# Patient Record
Sex: Female | Born: 1940
Health system: Southern US, Community
[De-identification: ages and names within clinical notes are randomized; demographics above are authoritative.]

## PROBLEM LIST (undated history)

## (undated) DIAGNOSIS — I272 Pulmonary hypertension, unspecified: Secondary | ICD-10-CM

## (undated) DIAGNOSIS — I700 Atherosclerosis of aorta: Secondary | ICD-10-CM

## (undated) DIAGNOSIS — R809 Proteinuria, unspecified: Secondary | ICD-10-CM

## (undated) DIAGNOSIS — Z9221 Personal history of antineoplastic chemotherapy: Secondary | ICD-10-CM

## (undated) DIAGNOSIS — Z9889 Other specified postprocedural states: Secondary | ICD-10-CM

## (undated) DIAGNOSIS — I10 Essential (primary) hypertension: Secondary | ICD-10-CM

## (undated) DIAGNOSIS — E7800 Pure hypercholesterolemia, unspecified: Secondary | ICD-10-CM

## (undated) DIAGNOSIS — G4733 Obstructive sleep apnea (adult) (pediatric): Secondary | ICD-10-CM

## (undated) DIAGNOSIS — R7302 Impaired glucose tolerance (oral): Secondary | ICD-10-CM

## (undated) DIAGNOSIS — T8859XA Other complications of anesthesia, initial encounter: Secondary | ICD-10-CM

## (undated) DIAGNOSIS — R0609 Other forms of dyspnea: Secondary | ICD-10-CM

## (undated) DIAGNOSIS — K635 Polyp of colon: Secondary | ICD-10-CM

## (undated) DIAGNOSIS — E538 Deficiency of other specified B group vitamins: Secondary | ICD-10-CM

## (undated) DIAGNOSIS — F419 Anxiety disorder, unspecified: Secondary | ICD-10-CM

## (undated) DIAGNOSIS — I209 Angina pectoris, unspecified: Secondary | ICD-10-CM

## (undated) DIAGNOSIS — I2510 Atherosclerotic heart disease of native coronary artery without angina pectoris: Secondary | ICD-10-CM

## (undated) DIAGNOSIS — Z923 Personal history of irradiation: Secondary | ICD-10-CM

## (undated) DIAGNOSIS — R112 Nausea with vomiting, unspecified: Secondary | ICD-10-CM

## (undated) DIAGNOSIS — Z7982 Long term (current) use of aspirin: Secondary | ICD-10-CM

## (undated) DIAGNOSIS — G4730 Sleep apnea, unspecified: Secondary | ICD-10-CM

## (undated) DIAGNOSIS — R0600 Dyspnea, unspecified: Secondary | ICD-10-CM

## (undated) DIAGNOSIS — C50919 Malignant neoplasm of unspecified site of unspecified female breast: Secondary | ICD-10-CM

## (undated) DIAGNOSIS — I517 Cardiomegaly: Secondary | ICD-10-CM

## (undated) DIAGNOSIS — M1990 Unspecified osteoarthritis, unspecified site: Secondary | ICD-10-CM

## (undated) DIAGNOSIS — Z9842 Cataract extraction status, left eye: Secondary | ICD-10-CM

## (undated) DIAGNOSIS — M2550 Pain in unspecified joint: Secondary | ICD-10-CM

## (undated) DIAGNOSIS — G629 Polyneuropathy, unspecified: Secondary | ICD-10-CM

## (undated) DIAGNOSIS — R011 Cardiac murmur, unspecified: Secondary | ICD-10-CM

## (undated) DIAGNOSIS — E559 Vitamin D deficiency, unspecified: Secondary | ICD-10-CM

## (undated) DIAGNOSIS — N189 Chronic kidney disease, unspecified: Secondary | ICD-10-CM

## (undated) DIAGNOSIS — I429 Cardiomyopathy, unspecified: Secondary | ICD-10-CM

## (undated) DIAGNOSIS — I5020 Unspecified systolic (congestive) heart failure: Secondary | ICD-10-CM

## (undated) DIAGNOSIS — Z9841 Cataract extraction status, right eye: Secondary | ICD-10-CM

## (undated) DIAGNOSIS — N183 Chronic kidney disease, stage 3 unspecified: Secondary | ICD-10-CM

## (undated) HISTORY — PX: CATARACT EXTRACTION, BILATERAL: SHX1313

## (undated) HISTORY — PX: TUBAL LIGATION: SHX77

## (undated) HISTORY — PX: ABDOMINAL HYSTERECTOMY: SHX81

## (undated) HISTORY — PX: UMBILICAL HERNIA REPAIR: SHX196

## (undated) HISTORY — PX: COLONOSCOPY: SHX174

## (undated) HISTORY — PX: DILATION AND CURETTAGE OF UTERUS: SHX78

## (undated) HISTORY — PX: HERNIA REPAIR: SHX51

## (undated) HISTORY — PX: CHOLECYSTECTOMY: SHX55

---

## 2000-11-03 DIAGNOSIS — C50919 Malignant neoplasm of unspecified site of unspecified female breast: Secondary | ICD-10-CM

## 2000-11-03 DIAGNOSIS — C50912 Malignant neoplasm of unspecified site of left female breast: Secondary | ICD-10-CM

## 2000-11-03 HISTORY — DX: Malignant neoplasm of unspecified site of unspecified female breast: C50.919

## 2000-11-03 HISTORY — PX: MASTECTOMY: SHX3

## 2000-11-03 HISTORY — DX: Malignant neoplasm of unspecified site of left female breast: C50.912

## 2004-11-07 ENCOUNTER — Ambulatory Visit: Payer: Self-pay | Admitting: Gastroenterology

## 2005-01-07 ENCOUNTER — Ambulatory Visit: Payer: Self-pay | Admitting: Internal Medicine

## 2005-02-01 ENCOUNTER — Ambulatory Visit: Payer: Self-pay | Admitting: Internal Medicine

## 2005-04-28 ENCOUNTER — Ambulatory Visit: Payer: Self-pay | Admitting: Internal Medicine

## 2005-11-10 ENCOUNTER — Ambulatory Visit: Payer: Self-pay | Admitting: Internal Medicine

## 2006-02-13 ENCOUNTER — Ambulatory Visit: Payer: Self-pay | Admitting: Ophthalmology

## 2006-03-30 ENCOUNTER — Ambulatory Visit: Payer: Self-pay | Admitting: Ophthalmology

## 2006-05-05 ENCOUNTER — Ambulatory Visit: Payer: Self-pay | Admitting: Ophthalmology

## 2006-05-11 ENCOUNTER — Ambulatory Visit: Payer: Self-pay | Admitting: Ophthalmology

## 2006-05-18 ENCOUNTER — Ambulatory Visit: Payer: Self-pay | Admitting: Internal Medicine

## 2006-06-03 ENCOUNTER — Ambulatory Visit: Payer: Self-pay | Admitting: Internal Medicine

## 2006-12-01 ENCOUNTER — Ambulatory Visit: Payer: Self-pay | Admitting: Internal Medicine

## 2006-12-07 ENCOUNTER — Ambulatory Visit: Payer: Self-pay | Admitting: Internal Medicine

## 2006-12-08 ENCOUNTER — Ambulatory Visit: Payer: Self-pay | Admitting: Internal Medicine

## 2006-12-21 ENCOUNTER — Ambulatory Visit: Payer: Self-pay | Admitting: Internal Medicine

## 2007-01-19 ENCOUNTER — Ambulatory Visit: Payer: Self-pay | Admitting: Surgery

## 2007-01-19 ENCOUNTER — Other Ambulatory Visit: Payer: Self-pay

## 2007-02-02 ENCOUNTER — Ambulatory Visit: Payer: Self-pay | Admitting: Internal Medicine

## 2007-02-22 ENCOUNTER — Encounter: Payer: Self-pay | Admitting: Neurosurgery

## 2007-03-01 ENCOUNTER — Ambulatory Visit: Payer: Self-pay | Admitting: Internal Medicine

## 2007-03-04 ENCOUNTER — Encounter: Payer: Self-pay | Admitting: Neurosurgery

## 2007-03-04 ENCOUNTER — Ambulatory Visit: Payer: Self-pay | Admitting: Internal Medicine

## 2007-04-04 ENCOUNTER — Encounter: Payer: Self-pay | Admitting: Neurosurgery

## 2007-05-04 ENCOUNTER — Encounter: Payer: Self-pay | Admitting: Neurosurgery

## 2007-09-04 ENCOUNTER — Ambulatory Visit: Payer: Self-pay | Admitting: Internal Medicine

## 2007-09-20 ENCOUNTER — Ambulatory Visit: Payer: Self-pay | Admitting: Internal Medicine

## 2007-10-04 ENCOUNTER — Ambulatory Visit: Payer: Self-pay | Admitting: Internal Medicine

## 2007-12-05 ENCOUNTER — Ambulatory Visit: Payer: Self-pay | Admitting: Internal Medicine

## 2008-01-02 ENCOUNTER — Ambulatory Visit: Payer: Self-pay | Admitting: Internal Medicine

## 2008-03-03 ENCOUNTER — Ambulatory Visit: Payer: Self-pay | Admitting: Internal Medicine

## 2008-03-13 ENCOUNTER — Ambulatory Visit: Payer: Self-pay | Admitting: Internal Medicine

## 2008-04-03 ENCOUNTER — Ambulatory Visit: Payer: Self-pay | Admitting: Internal Medicine

## 2008-04-14 ENCOUNTER — Ambulatory Visit: Payer: Self-pay | Admitting: Internal Medicine

## 2008-05-03 ENCOUNTER — Ambulatory Visit: Payer: Self-pay | Admitting: Internal Medicine

## 2008-06-03 ENCOUNTER — Ambulatory Visit: Payer: Self-pay | Admitting: Internal Medicine

## 2008-12-04 ENCOUNTER — Ambulatory Visit: Payer: Self-pay | Admitting: Internal Medicine

## 2008-12-12 ENCOUNTER — Ambulatory Visit: Payer: Self-pay | Admitting: Internal Medicine

## 2009-01-01 ENCOUNTER — Ambulatory Visit: Payer: Self-pay | Admitting: Internal Medicine

## 2009-04-09 ENCOUNTER — Ambulatory Visit: Payer: Self-pay | Admitting: Internal Medicine

## 2010-09-16 ENCOUNTER — Ambulatory Visit: Payer: Self-pay | Admitting: Internal Medicine

## 2011-01-08 ENCOUNTER — Emergency Department: Payer: Self-pay | Admitting: Emergency Medicine

## 2013-01-04 ENCOUNTER — Ambulatory Visit: Payer: Self-pay | Admitting: Family Medicine

## 2014-02-17 DIAGNOSIS — E7800 Pure hypercholesterolemia, unspecified: Secondary | ICD-10-CM | POA: Insufficient documentation

## 2014-02-17 DIAGNOSIS — I10 Essential (primary) hypertension: Secondary | ICD-10-CM | POA: Insufficient documentation

## 2014-04-20 ENCOUNTER — Ambulatory Visit: Payer: Self-pay | Admitting: Family Medicine

## 2015-06-12 ENCOUNTER — Other Ambulatory Visit: Payer: Self-pay | Admitting: Family Medicine

## 2015-06-12 DIAGNOSIS — Z1231 Encounter for screening mammogram for malignant neoplasm of breast: Secondary | ICD-10-CM

## 2015-12-13 DIAGNOSIS — Z4432 Encounter for fitting and adjustment of external left breast prosthesis: Secondary | ICD-10-CM | POA: Diagnosis not present

## 2015-12-13 DIAGNOSIS — C50112 Malignant neoplasm of central portion of left female breast: Secondary | ICD-10-CM | POA: Diagnosis not present

## 2016-01-28 DIAGNOSIS — C50112 Malignant neoplasm of central portion of left female breast: Secondary | ICD-10-CM | POA: Diagnosis not present

## 2016-01-28 DIAGNOSIS — Z4432 Encounter for fitting and adjustment of external left breast prosthesis: Secondary | ICD-10-CM | POA: Diagnosis not present

## 2016-04-21 DIAGNOSIS — K1379 Other lesions of oral mucosa: Secondary | ICD-10-CM | POA: Diagnosis not present

## 2017-02-01 DIAGNOSIS — E559 Vitamin D deficiency, unspecified: Secondary | ICD-10-CM | POA: Insufficient documentation

## 2017-02-01 DIAGNOSIS — E538 Deficiency of other specified B group vitamins: Secondary | ICD-10-CM | POA: Insufficient documentation

## 2017-02-04 DIAGNOSIS — E538 Deficiency of other specified B group vitamins: Secondary | ICD-10-CM | POA: Diagnosis not present

## 2017-02-04 DIAGNOSIS — E669 Obesity, unspecified: Secondary | ICD-10-CM | POA: Diagnosis not present

## 2017-02-04 DIAGNOSIS — Z131 Encounter for screening for diabetes mellitus: Secondary | ICD-10-CM | POA: Diagnosis not present

## 2017-02-04 DIAGNOSIS — Z79899 Other long term (current) drug therapy: Secondary | ICD-10-CM | POA: Diagnosis not present

## 2017-02-04 DIAGNOSIS — M159 Polyosteoarthritis, unspecified: Secondary | ICD-10-CM | POA: Diagnosis not present

## 2017-02-04 DIAGNOSIS — E782 Mixed hyperlipidemia: Secondary | ICD-10-CM | POA: Diagnosis not present

## 2017-02-04 DIAGNOSIS — I10 Essential (primary) hypertension: Secondary | ICD-10-CM | POA: Diagnosis not present

## 2017-02-04 DIAGNOSIS — G629 Polyneuropathy, unspecified: Secondary | ICD-10-CM | POA: Diagnosis not present

## 2017-03-02 DIAGNOSIS — M2550 Pain in unspecified joint: Secondary | ICD-10-CM | POA: Diagnosis not present

## 2017-03-02 DIAGNOSIS — E669 Obesity, unspecified: Secondary | ICD-10-CM | POA: Diagnosis not present

## 2017-03-02 DIAGNOSIS — G8929 Other chronic pain: Secondary | ICD-10-CM | POA: Diagnosis not present

## 2017-03-02 DIAGNOSIS — M170 Bilateral primary osteoarthritis of knee: Secondary | ICD-10-CM | POA: Diagnosis not present

## 2017-03-02 DIAGNOSIS — G629 Polyneuropathy, unspecified: Secondary | ICD-10-CM | POA: Diagnosis not present

## 2017-03-26 DIAGNOSIS — M2550 Pain in unspecified joint: Secondary | ICD-10-CM | POA: Diagnosis not present

## 2017-03-26 DIAGNOSIS — G629 Polyneuropathy, unspecified: Secondary | ICD-10-CM | POA: Diagnosis not present

## 2017-03-26 DIAGNOSIS — R7303 Prediabetes: Secondary | ICD-10-CM | POA: Diagnosis not present

## 2017-03-26 DIAGNOSIS — E538 Deficiency of other specified B group vitamins: Secondary | ICD-10-CM | POA: Diagnosis not present

## 2017-03-26 DIAGNOSIS — E559 Vitamin D deficiency, unspecified: Secondary | ICD-10-CM | POA: Diagnosis not present

## 2017-06-08 ENCOUNTER — Other Ambulatory Visit: Payer: Self-pay | Admitting: Nurse Practitioner

## 2017-06-08 DIAGNOSIS — Z1239 Encounter for other screening for malignant neoplasm of breast: Secondary | ICD-10-CM

## 2017-06-09 ENCOUNTER — Other Ambulatory Visit: Payer: Self-pay | Admitting: Nurse Practitioner

## 2017-06-09 DIAGNOSIS — Z1231 Encounter for screening mammogram for malignant neoplasm of breast: Secondary | ICD-10-CM

## 2017-06-23 ENCOUNTER — Ambulatory Visit
Admission: RE | Admit: 2017-06-23 | Discharge: 2017-06-23 | Disposition: A | Discharge status: Home / Self Care | Payer: PPO | Source: Ambulatory Visit | Attending: Nurse Practitioner | Admitting: Nurse Practitioner

## 2017-06-23 DIAGNOSIS — Z1231 Encounter for screening mammogram for malignant neoplasm of breast: Secondary | ICD-10-CM

## 2017-08-03 DIAGNOSIS — C50112 Malignant neoplasm of central portion of left female breast: Secondary | ICD-10-CM | POA: Diagnosis not present

## 2017-08-03 DIAGNOSIS — Z4432 Encounter for fitting and adjustment of external left breast prosthesis: Secondary | ICD-10-CM | POA: Diagnosis not present

## 2017-08-12 DIAGNOSIS — Z4432 Encounter for fitting and adjustment of external left breast prosthesis: Secondary | ICD-10-CM | POA: Diagnosis not present

## 2017-08-12 DIAGNOSIS — C50112 Malignant neoplasm of central portion of left female breast: Secondary | ICD-10-CM | POA: Diagnosis not present

## 2018-03-04 DIAGNOSIS — E7800 Pure hypercholesterolemia, unspecified: Secondary | ICD-10-CM | POA: Diagnosis not present

## 2018-03-04 DIAGNOSIS — E559 Vitamin D deficiency, unspecified: Secondary | ICD-10-CM | POA: Diagnosis not present

## 2018-03-04 DIAGNOSIS — E538 Deficiency of other specified B group vitamins: Secondary | ICD-10-CM | POA: Diagnosis not present

## 2018-03-04 DIAGNOSIS — I10 Essential (primary) hypertension: Secondary | ICD-10-CM | POA: Diagnosis not present

## 2018-03-04 DIAGNOSIS — F419 Anxiety disorder, unspecified: Secondary | ICD-10-CM | POA: Diagnosis not present

## 2018-03-04 DIAGNOSIS — Z79899 Other long term (current) drug therapy: Secondary | ICD-10-CM | POA: Diagnosis not present

## 2018-03-04 DIAGNOSIS — G629 Polyneuropathy, unspecified: Secondary | ICD-10-CM | POA: Diagnosis not present

## 2018-03-04 DIAGNOSIS — R7302 Impaired glucose tolerance (oral): Secondary | ICD-10-CM | POA: Diagnosis not present

## 2018-03-04 DIAGNOSIS — Z9012 Acquired absence of left breast and nipple: Secondary | ICD-10-CM | POA: Diagnosis not present

## 2018-05-07 DIAGNOSIS — C50112 Malignant neoplasm of central portion of left female breast: Secondary | ICD-10-CM | POA: Diagnosis not present

## 2018-05-07 DIAGNOSIS — Z4432 Encounter for fitting and adjustment of external left breast prosthesis: Secondary | ICD-10-CM | POA: Diagnosis not present

## 2018-11-29 ENCOUNTER — Other Ambulatory Visit: Payer: Self-pay | Admitting: Nurse Practitioner

## 2018-11-29 DIAGNOSIS — Z1231 Encounter for screening mammogram for malignant neoplasm of breast: Secondary | ICD-10-CM

## 2018-12-02 ENCOUNTER — Ambulatory Visit
Admission: RE | Admit: 2018-12-02 | Discharge: 2018-12-02 | Disposition: A | Discharge status: Home / Self Care | Payer: PPO | Source: Ambulatory Visit | Attending: Nurse Practitioner | Admitting: Nurse Practitioner

## 2018-12-02 DIAGNOSIS — Z1231 Encounter for screening mammogram for malignant neoplasm of breast: Secondary | ICD-10-CM | POA: Diagnosis not present

## 2018-12-02 HISTORY — DX: Malignant neoplasm of unspecified site of unspecified female breast: C50.919

## 2018-12-02 HISTORY — DX: Personal history of antineoplastic chemotherapy: Z92.21

## 2018-12-02 HISTORY — DX: Personal history of irradiation: Z92.3

## 2018-12-30 DIAGNOSIS — E538 Deficiency of other specified B group vitamins: Secondary | ICD-10-CM | POA: Diagnosis not present

## 2018-12-30 DIAGNOSIS — Z853 Personal history of malignant neoplasm of breast: Secondary | ICD-10-CM | POA: Diagnosis not present

## 2018-12-30 DIAGNOSIS — I10 Essential (primary) hypertension: Secondary | ICD-10-CM | POA: Diagnosis not present

## 2018-12-30 DIAGNOSIS — E559 Vitamin D deficiency, unspecified: Secondary | ICD-10-CM | POA: Diagnosis not present

## 2018-12-30 DIAGNOSIS — E7800 Pure hypercholesterolemia, unspecified: Secondary | ICD-10-CM | POA: Diagnosis not present

## 2018-12-30 DIAGNOSIS — Z79899 Other long term (current) drug therapy: Secondary | ICD-10-CM | POA: Diagnosis not present

## 2018-12-30 DIAGNOSIS — F419 Anxiety disorder, unspecified: Secondary | ICD-10-CM | POA: Diagnosis not present

## 2019-06-30 DIAGNOSIS — Z79899 Other long term (current) drug therapy: Secondary | ICD-10-CM | POA: Diagnosis not present

## 2019-06-30 DIAGNOSIS — E669 Obesity, unspecified: Secondary | ICD-10-CM | POA: Diagnosis not present

## 2019-06-30 DIAGNOSIS — E559 Vitamin D deficiency, unspecified: Secondary | ICD-10-CM | POA: Diagnosis not present

## 2019-06-30 DIAGNOSIS — Z853 Personal history of malignant neoplasm of breast: Secondary | ICD-10-CM | POA: Diagnosis not present

## 2019-06-30 DIAGNOSIS — G629 Polyneuropathy, unspecified: Secondary | ICD-10-CM | POA: Diagnosis not present

## 2019-06-30 DIAGNOSIS — I10 Essential (primary) hypertension: Secondary | ICD-10-CM | POA: Diagnosis not present

## 2019-06-30 DIAGNOSIS — Z13820 Encounter for screening for osteoporosis: Secondary | ICD-10-CM | POA: Diagnosis not present

## 2019-06-30 DIAGNOSIS — F419 Anxiety disorder, unspecified: Secondary | ICD-10-CM | POA: Diagnosis not present

## 2019-06-30 DIAGNOSIS — Z0000 Encounter for general adult medical examination without abnormal findings: Secondary | ICD-10-CM | POA: Diagnosis not present

## 2019-06-30 DIAGNOSIS — E7800 Pure hypercholesterolemia, unspecified: Secondary | ICD-10-CM | POA: Diagnosis not present

## 2019-06-30 DIAGNOSIS — Z1211 Encounter for screening for malignant neoplasm of colon: Secondary | ICD-10-CM | POA: Diagnosis not present

## 2019-06-30 DIAGNOSIS — R7302 Impaired glucose tolerance (oral): Secondary | ICD-10-CM | POA: Diagnosis not present

## 2019-06-30 DIAGNOSIS — E538 Deficiency of other specified B group vitamins: Secondary | ICD-10-CM | POA: Diagnosis not present

## 2019-06-30 DIAGNOSIS — Z23 Encounter for immunization: Secondary | ICD-10-CM | POA: Diagnosis not present

## 2019-08-17 DIAGNOSIS — Z780 Asymptomatic menopausal state: Secondary | ICD-10-CM | POA: Diagnosis not present

## 2019-11-21 ENCOUNTER — Other Ambulatory Visit: Payer: PPO

## 2019-12-28 ENCOUNTER — Other Ambulatory Visit: Payer: Self-pay | Admitting: Nurse Practitioner

## 2019-12-28 DIAGNOSIS — Z1231 Encounter for screening mammogram for malignant neoplasm of breast: Secondary | ICD-10-CM

## 2020-01-03 DIAGNOSIS — E538 Deficiency of other specified B group vitamins: Secondary | ICD-10-CM | POA: Diagnosis not present

## 2020-01-03 DIAGNOSIS — F419 Anxiety disorder, unspecified: Secondary | ICD-10-CM | POA: Diagnosis not present

## 2020-01-03 DIAGNOSIS — M25561 Pain in right knee: Secondary | ICD-10-CM | POA: Diagnosis not present

## 2020-01-03 DIAGNOSIS — E559 Vitamin D deficiency, unspecified: Secondary | ICD-10-CM | POA: Diagnosis not present

## 2020-01-03 DIAGNOSIS — E7800 Pure hypercholesterolemia, unspecified: Secondary | ICD-10-CM | POA: Diagnosis not present

## 2020-01-03 DIAGNOSIS — G8929 Other chronic pain: Secondary | ICD-10-CM | POA: Diagnosis not present

## 2020-01-03 DIAGNOSIS — M25562 Pain in left knee: Secondary | ICD-10-CM | POA: Diagnosis not present

## 2020-01-03 DIAGNOSIS — I10 Essential (primary) hypertension: Secondary | ICD-10-CM | POA: Diagnosis not present

## 2020-01-03 DIAGNOSIS — R7302 Impaired glucose tolerance (oral): Secondary | ICD-10-CM | POA: Diagnosis not present

## 2020-01-03 DIAGNOSIS — E669 Obesity, unspecified: Secondary | ICD-10-CM | POA: Diagnosis not present

## 2020-01-03 DIAGNOSIS — M170 Bilateral primary osteoarthritis of knee: Secondary | ICD-10-CM | POA: Diagnosis not present

## 2020-01-03 DIAGNOSIS — Z79899 Other long term (current) drug therapy: Secondary | ICD-10-CM | POA: Diagnosis not present

## 2020-01-30 DIAGNOSIS — M25562 Pain in left knee: Secondary | ICD-10-CM | POA: Diagnosis not present

## 2020-01-30 DIAGNOSIS — M25561 Pain in right knee: Secondary | ICD-10-CM | POA: Diagnosis not present

## 2020-01-30 DIAGNOSIS — M172 Bilateral post-traumatic osteoarthritis of knee: Secondary | ICD-10-CM | POA: Diagnosis not present

## 2020-02-17 ENCOUNTER — Other Ambulatory Visit: Admission: RE | Admit: 2020-02-17 | Payer: PPO | Source: Ambulatory Visit

## 2020-02-21 ENCOUNTER — Ambulatory Visit: Admission: RE | Admit: 2020-02-21 | Payer: PPO | Source: Home / Self Care | Admitting: Internal Medicine

## 2020-02-21 ENCOUNTER — Encounter: Admission: RE | Payer: Self-pay | Source: Home / Self Care

## 2020-02-21 SURGERY — COLONOSCOPY WITH PROPOFOL
Anesthesia: General

## 2020-03-02 ENCOUNTER — Ambulatory Visit
Admission: RE | Admit: 2020-03-02 | Discharge: 2020-03-02 | Disposition: A | Discharge status: Home / Self Care | Payer: PPO | Source: Ambulatory Visit | Attending: Nurse Practitioner | Admitting: Nurse Practitioner

## 2020-03-02 DIAGNOSIS — Z1231 Encounter for screening mammogram for malignant neoplasm of breast: Secondary | ICD-10-CM | POA: Diagnosis not present

## 2020-06-05 DIAGNOSIS — M172 Bilateral post-traumatic osteoarthritis of knee: Secondary | ICD-10-CM | POA: Diagnosis not present

## 2020-06-28 DIAGNOSIS — Z9012 Acquired absence of left breast and nipple: Secondary | ICD-10-CM | POA: Diagnosis not present

## 2020-06-28 DIAGNOSIS — Z4432 Encounter for fitting and adjustment of external left breast prosthesis: Secondary | ICD-10-CM | POA: Diagnosis not present

## 2020-06-28 DIAGNOSIS — C50112 Malignant neoplasm of central portion of left female breast: Secondary | ICD-10-CM | POA: Diagnosis not present

## 2020-08-03 DIAGNOSIS — Z79899 Other long term (current) drug therapy: Secondary | ICD-10-CM | POA: Diagnosis not present

## 2020-08-03 DIAGNOSIS — E669 Obesity, unspecified: Secondary | ICD-10-CM | POA: Diagnosis not present

## 2020-08-03 DIAGNOSIS — E538 Deficiency of other specified B group vitamins: Secondary | ICD-10-CM | POA: Diagnosis not present

## 2020-08-03 DIAGNOSIS — I10 Essential (primary) hypertension: Secondary | ICD-10-CM | POA: Diagnosis not present

## 2020-08-03 DIAGNOSIS — G629 Polyneuropathy, unspecified: Secondary | ICD-10-CM | POA: Diagnosis not present

## 2020-08-03 DIAGNOSIS — Z23 Encounter for immunization: Secondary | ICD-10-CM | POA: Diagnosis not present

## 2020-08-03 DIAGNOSIS — Z0000 Encounter for general adult medical examination without abnormal findings: Secondary | ICD-10-CM | POA: Diagnosis not present

## 2020-08-03 DIAGNOSIS — R7302 Impaired glucose tolerance (oral): Secondary | ICD-10-CM | POA: Diagnosis not present

## 2020-08-03 DIAGNOSIS — E7800 Pure hypercholesterolemia, unspecified: Secondary | ICD-10-CM | POA: Diagnosis not present

## 2020-08-03 DIAGNOSIS — F419 Anxiety disorder, unspecified: Secondary | ICD-10-CM | POA: Diagnosis not present

## 2020-08-03 DIAGNOSIS — M25561 Pain in right knee: Secondary | ICD-10-CM | POA: Diagnosis not present

## 2020-08-03 DIAGNOSIS — Z1211 Encounter for screening for malignant neoplasm of colon: Secondary | ICD-10-CM | POA: Diagnosis not present

## 2020-08-03 DIAGNOSIS — E559 Vitamin D deficiency, unspecified: Secondary | ICD-10-CM | POA: Diagnosis not present

## 2020-08-07 DIAGNOSIS — M172 Bilateral post-traumatic osteoarthritis of knee: Secondary | ICD-10-CM | POA: Diagnosis not present

## 2020-09-13 DIAGNOSIS — M1712 Unilateral primary osteoarthritis, left knee: Secondary | ICD-10-CM | POA: Diagnosis not present

## 2020-09-13 DIAGNOSIS — M1711 Unilateral primary osteoarthritis, right knee: Secondary | ICD-10-CM | POA: Diagnosis not present

## 2020-09-16 DIAGNOSIS — M1712 Unilateral primary osteoarthritis, left knee: Secondary | ICD-10-CM | POA: Insufficient documentation

## 2020-09-16 DIAGNOSIS — M1711 Unilateral primary osteoarthritis, right knee: Secondary | ICD-10-CM | POA: Insufficient documentation

## 2020-12-04 ENCOUNTER — Encounter
Admission: RE | Admit: 2020-12-04 | Discharge: 2020-12-04 | Disposition: A | Discharge status: Home / Self Care | Payer: PPO | Source: Ambulatory Visit | Attending: Orthopedic Surgery | Admitting: Orthopedic Surgery

## 2020-12-04 ENCOUNTER — Other Ambulatory Visit: Payer: Self-pay

## 2020-12-04 DIAGNOSIS — Z01818 Encounter for other preprocedural examination: Secondary | ICD-10-CM | POA: Insufficient documentation

## 2020-12-04 DIAGNOSIS — Z01810 Encounter for preprocedural cardiovascular examination: Secondary | ICD-10-CM | POA: Diagnosis not present

## 2020-12-04 HISTORY — DX: Cardiac murmur, unspecified: R01.1

## 2020-12-04 HISTORY — DX: Other specified postprocedural states: R11.2

## 2020-12-04 HISTORY — DX: Other complications of anesthesia, initial encounter: T88.59XA

## 2020-12-04 HISTORY — DX: Other specified postprocedural states: Z98.890

## 2020-12-04 LAB — URINALYSIS, ROUTINE W REFLEX MICROSCOPIC
Bilirubin Urine: NEGATIVE
Glucose, UA: NEGATIVE mg/dL
Ketones, ur: NEGATIVE mg/dL
Nitrite: NEGATIVE
Protein, ur: NEGATIVE mg/dL
Specific Gravity, Urine: 1.016 (ref 1.005–1.030)
pH: 5 (ref 5.0–8.0)

## 2020-12-04 LAB — CBC
HCT: 46 % (ref 36.0–46.0)
Hemoglobin: 15 g/dL (ref 12.0–15.0)
MCH: 29.5 pg (ref 26.0–34.0)
MCHC: 32.6 g/dL (ref 30.0–36.0)
MCV: 90.6 fL (ref 80.0–100.0)
Platelets: 221 10*3/uL (ref 150–400)
RBC: 5.08 MIL/uL (ref 3.87–5.11)
RDW: 12.8 % (ref 11.5–15.5)
WBC: 7.4 10*3/uL (ref 4.0–10.5)
nRBC: 0 % (ref 0.0–0.2)

## 2020-12-04 LAB — SURGICAL PCR SCREEN
MRSA, PCR: NEGATIVE
Staphylococcus aureus: POSITIVE — AB

## 2020-12-04 LAB — COMPREHENSIVE METABOLIC PANEL
ALT: 14 U/L (ref 0–44)
AST: 13 U/L — ABNORMAL LOW (ref 15–41)
Albumin: 3.6 g/dL (ref 3.5–5.0)
Alkaline Phosphatase: 72 U/L (ref 38–126)
Anion gap: 10 (ref 5–15)
BUN: 15 mg/dL (ref 8–23)
CO2: 29 mmol/L (ref 22–32)
Calcium: 9 mg/dL (ref 8.9–10.3)
Chloride: 101 mmol/L (ref 98–111)
Creatinine, Ser: 0.86 mg/dL (ref 0.44–1.00)
GFR, Estimated: >60 mL/min (ref >60)
Glucose, Bld: 86 mg/dL (ref 70–99)
Potassium: 3.3 mmol/L — ABNORMAL LOW (ref 3.5–5.1)
Sodium: 140 mmol/L (ref 135–145)
Total Bilirubin: 0.9 mg/dL (ref 0.3–1.2)
Total Protein: 7.1 g/dL (ref 6.5–8.1)

## 2020-12-04 LAB — SEDIMENTATION RATE: Sed Rate: 2 mm/hr (ref 0–30)

## 2020-12-04 LAB — APTT: aPTT: 30 seconds (ref 24–36)

## 2020-12-04 LAB — TYPE AND SCREEN
ABO/RH(D): O POS
Antibody Screen: NEGATIVE

## 2020-12-04 LAB — PROTIME-INR
INR: 1.1 (ref 0.8–1.2)
Prothrombin Time: 13.7 seconds (ref 11.4–15.2)

## 2020-12-04 LAB — C-REACTIVE PROTEIN: CRP: 0.7 mg/dL (ref <1.0)

## 2020-12-04 NOTE — Pre-Procedure Instructions (Signed)
Patient reports that she was told that she can restart gabapentin , she reports that she stop taking gabapentin in October 2021. She was seen in office but this information cant be verified in MD note. She has surgery schedule and she has an order for gabapentin to be given the morning of surgery. Dr. Maree Krabbe Office notified by this Probation officer.

## 2020-12-04 NOTE — Patient Instructions (Addendum)
Your procedure is scheduled on: 12/10/2020- MONDAY Report to the Registration Desk on the 1st floor of the [facility]. To find out your arrival time, please call [phone] between 1PM - 3PM on: 12/07/2020- FRIDAY  REMEMBER: Instructions that are not followed completely may result in serious medical risk, up to and including death; or upon the discretion of your surgeon and anesthesiologist your surgery may need to be rescheduled.  Do not eat food after midnight the night before surgery.  No gum chewing, lozengers or hard candies.  -  your doctor has ordered for you to drink the provided  Ensure Pre-Surgery Clear Carbohydrate Drink  Drinking this carbohydrate drink up to two hours before surgery helps to reduce insulin resistance and improve patient outcomes. Please complete drinking 2 hours prior to scheduled arrival time.  TAKE THESE MEDICATIONS THE MORNING OF SURGERY WITH A SIP OF WATER: - diltiazem (CARDIZEM CD) 240 MG 24 hr capsule - venlafaxine XR (EFFEXOR-XR) 75 MG 24 hr capsule - GABAPENTIN AS DIRECTED  One week prior to surgery: STOP TAKING 12/04/20- aspirin-sod bicarb-citric acid (ALKA-SELTZER) 325 MG TBEF tablet Stop Anti-inflammatories (NSAIDS) such as Advil, Aleve, Ibuprofen, Motrin, Naproxen, Naprosyn and Aspirin based products such as Excedrin, Goodys Powder, BC Powder.   Stop ANY OVER THE COUNTER supplements until after surgery. (However, you may continue taking Vitamin D, Vitamin B, and multivitamin up until the day before surgery.)  No Alcohol for 24 hours before or after surgery.  No Smoking including e-cigarettes for 24 hours prior to surgery.  No chewable tobacco products for at least 6 hours prior to surgery.  No nicotine patches on the day of surgery.  Do not use any "recreational" drugs for at least a week prior to your surgery.  Please be advised that the combination of cocaine and anesthesia may have negative outcomes, up to and including death. If you  test positive for cocaine, your surgery will be cancelled.  On the morning of surgery brush your teeth with toothpaste and water, you may rinse your mouth with mouthwash if you wish. Do not swallow any toothpaste or mouthwash.  Do not wear jewelry, make-up, hairpins, clips or nail polish.  Do not wear lotions, powders, or perfumes.   Do not shave body from the neck down 48 hours prior to surgery just in case you cut yourself which could leave a site for infection.  Also, freshly shaved skin may become irritated if using the CHG soap.  Contact lenses, hearing aids and dentures may not be worn into surgery.  Do not bring valuables to the hospital. [facility] is not responsible for any missing/lost belongings or valuables.   Use CHG Soap or wipes as directed on instruction sheet.  Notify your doctor if there is any change in your medical condition (cold, fever, infection).  Wear comfortable clothing (specific to your surgery type) to the hospital.  Plan for stool softeners for home use; pain medications have a tendency to cause constipation. You can also help prevent constipation by eating foods high in fiber such as fruits and vegetables and drinking plenty of fluids as your diet allows.  After surgery, you can help prevent lung complications by doing breathing exercises.  Take deep breaths and cough every 1-2 hours. Your doctor may order a device called an Incentive Spirometer to help you take deep breaths. When coughing or sneezing, hold a pillow firmly against your incision with both hands. This is called "splinting." Doing this helps protect your incision. It  also decreases belly discomfort.  If you are being admitted to the hospital overnight, leave your suitcase in the car. After surgery it may be brought to your room.  If you are being discharged the day of surgery, you will not be allowed to drive home. You will need a responsible adult (18 years or older) to drive you home  and stay with you that night.   If you are taking public transportation, you will need to have a responsible adult (18 years or older) with you. Please confirm with your physician that it is acceptable to use public transportation.   Please call the [facility] at [phone] if you have any questions about these instructions.  Visitation Policy:  Patients undergoing a surgery or procedure may have one family member or support person with them as long as that person is not COVID-19 positive or experiencing its symptoms.  That person may remain in the waiting area during the procedure.  Inpatient Visitation:    Visiting hours are 7 a.m. to 8 p.m. Patients will be allowed one visitor. The visitor may change daily. The visitor must pass COVID-19 screenings, use hand sanitizer when entering and exiting the patient's room and wear a mask at all times, including in the patient's room. Patients must also wear a mask when staff or their visitor are in the room. Masking is required regardless of vaccination status. Systemwide, no visitors 17 or younger.

## 2020-12-06 ENCOUNTER — Other Ambulatory Visit: Payer: Self-pay

## 2020-12-06 ENCOUNTER — Other Ambulatory Visit
Admission: RE | Admit: 2020-12-06 | Discharge: 2020-12-06 | Disposition: A | Discharge status: Home / Self Care | Payer: PPO | Source: Ambulatory Visit | Attending: Orthopedic Surgery | Admitting: Orthopedic Surgery

## 2020-12-06 DIAGNOSIS — Z20822 Contact with and (suspected) exposure to covid-19: Secondary | ICD-10-CM | POA: Diagnosis not present

## 2020-12-06 DIAGNOSIS — Z01812 Encounter for preprocedural laboratory examination: Secondary | ICD-10-CM | POA: Diagnosis not present

## 2020-12-06 LAB — URINE CULTURE
Culture: >=100000 — AB
Special Requests: NORMAL

## 2020-12-07 LAB — SARS CORONAVIRUS 2 (TAT 6-24 HRS): SARS Coronavirus 2: NEGATIVE

## 2020-12-09 MED ORDER — CHLORHEXIDINE GLUCONATE 0.12 % MT SOLN: 15.0000 mL | Freq: Once | OROMUCOSAL | Status: AC

## 2020-12-09 MED ORDER — CELECOXIB 200 MG PO CAPS: 400.0000 mg | ORAL_CAPSULE | Freq: Once | ORAL | Status: AC

## 2020-12-09 MED ORDER — DEXAMETHASONE SODIUM PHOSPHATE 10 MG/ML IJ SOLN: 8.0000 mg | Freq: Once | INTRAMUSCULAR | Status: AC

## 2020-12-09 MED ORDER — FAMOTIDINE 20 MG PO TABS: 20.0000 mg | ORAL_TABLET | Freq: Once | ORAL | Status: AC

## 2020-12-09 MED ORDER — TRANEXAMIC ACID-NACL 1000-0.7 MG/100ML-% IV SOLN
1000.0000 mg | INTRAVENOUS | Status: AC
  Administered 2020-12-10: 1000 mg via INTRAVENOUS

## 2020-12-09 MED ORDER — ORAL CARE MOUTH RINSE: 15.0000 mL | Freq: Once | OROMUCOSAL | Status: AC

## 2020-12-09 MED ORDER — CHLORHEXIDINE GLUCONATE 4 % EX LIQD
60.0000 mL | Freq: Once | CUTANEOUS | Status: AC
  Administered 2020-12-10: 4 via TOPICAL

## 2020-12-09 MED ORDER — CEFAZOLIN SODIUM-DEXTROSE 2-4 GM/100ML-% IV SOLN
2.0000 g | INTRAVENOUS | Status: AC
  Administered 2020-12-10: 2 g via INTRAVENOUS

## 2020-12-09 MED ORDER — LACTATED RINGERS IV SOLN: INTRAVENOUS | Status: DC

## 2020-12-09 MED ORDER — GABAPENTIN 300 MG PO CAPS: 300.0000 mg | ORAL_CAPSULE | Freq: Once | ORAL | Status: DC

## 2020-12-10 ENCOUNTER — Ambulatory Visit: Payer: PPO | Admitting: Urgent Care

## 2020-12-10 ENCOUNTER — Encounter
Admission: RE | Disposition: A | Discharge status: Home / Self Care | Payer: Self-pay | Source: Home / Self Care | Attending: Orthopedic Surgery

## 2020-12-10 ENCOUNTER — Encounter: Payer: Self-pay | Admitting: Orthopedic Surgery

## 2020-12-10 ENCOUNTER — Ambulatory Visit: Payer: PPO

## 2020-12-10 ENCOUNTER — Other Ambulatory Visit: Payer: Self-pay

## 2020-12-10 ENCOUNTER — Ambulatory Visit
Admission: RE | Admit: 2020-12-10 | Discharge: 2020-12-10 | Disposition: A | Discharge status: Home / Self Care | Payer: PPO | Attending: Orthopedic Surgery | Admitting: Orthopedic Surgery

## 2020-12-10 DIAGNOSIS — Z79899 Other long term (current) drug therapy: Secondary | ICD-10-CM | POA: Insufficient documentation

## 2020-12-10 DIAGNOSIS — M1712 Unilateral primary osteoarthritis, left knee: Secondary | ICD-10-CM | POA: Insufficient documentation

## 2020-12-10 DIAGNOSIS — Z96652 Presence of left artificial knee joint: Secondary | ICD-10-CM

## 2020-12-10 DIAGNOSIS — M25762 Osteophyte, left knee: Secondary | ICD-10-CM | POA: Insufficient documentation

## 2020-12-10 DIAGNOSIS — Z471 Aftercare following joint replacement surgery: Secondary | ICD-10-CM | POA: Diagnosis not present

## 2020-12-10 DIAGNOSIS — Z9889 Other specified postprocedural states: Secondary | ICD-10-CM | POA: Diagnosis not present

## 2020-12-10 DIAGNOSIS — G629 Polyneuropathy, unspecified: Secondary | ICD-10-CM | POA: Insufficient documentation

## 2020-12-10 DIAGNOSIS — R7302 Impaired glucose tolerance (oral): Secondary | ICD-10-CM | POA: Insufficient documentation

## 2020-12-10 DIAGNOSIS — Z96659 Presence of unspecified artificial knee joint: Secondary | ICD-10-CM

## 2020-12-10 DIAGNOSIS — E669 Obesity, unspecified: Secondary | ICD-10-CM | POA: Insufficient documentation

## 2020-12-10 HISTORY — PX: KNEE ARTHROPLASTY: SHX992

## 2020-12-10 HISTORY — PX: APPLICATION OF WOUND VAC: SHX5189

## 2020-12-10 LAB — ABO/RH: ABO/RH(D): O POS

## 2020-12-10 SURGERY — ARTHROPLASTY, KNEE, TOTAL, USING IMAGELESS COMPUTER-ASSISTED NAVIGATION
Anesthesia: Spinal | Site: Knee | Laterality: Left

## 2020-12-10 MED ORDER — KETAMINE HCL 50 MG/5ML IJ SOSY
PREFILLED_SYRINGE | INTRAMUSCULAR | Status: AC
  Filled 2020-12-10: qty 5

## 2020-12-10 MED ORDER — MENTHOL 3 MG MT LOZG
1.0000 | LOZENGE | OROMUCOSAL | Status: DC | PRN
  Filled 2020-12-10: qty 9

## 2020-12-10 MED ORDER — DEXAMETHASONE SODIUM PHOSPHATE 10 MG/ML IJ SOLN
INTRAMUSCULAR | Status: AC
  Filled 2020-12-10: qty 1

## 2020-12-10 MED ORDER — TRAMADOL HCL 50 MG PO TABS
50.0000 mg | ORAL_TABLET | ORAL | Status: DC | PRN
  Administered 2020-12-10: 50 mg via ORAL

## 2020-12-10 MED ORDER — EPHEDRINE 5 MG/ML INJ
INTRAVENOUS | Status: AC
  Filled 2020-12-10: qty 10

## 2020-12-10 MED ORDER — LIDOCAINE HCL (PF) 2 % IJ SOLN
INTRAMUSCULAR | Status: AC
  Filled 2020-12-10: qty 5

## 2020-12-10 MED ORDER — FERROUS SULFATE 325 (65 FE) MG PO TABS
325.0000 mg | ORAL_TABLET | Freq: Two times a day (BID) | ORAL | Status: DC
  Filled 2020-12-10: qty 1

## 2020-12-10 MED ORDER — ENSURE PRE-SURGERY PO LIQD
296.0000 mL | Freq: Once | ORAL | Status: DC
  Filled 2020-12-10: qty 296

## 2020-12-10 MED ORDER — MAGNESIUM HYDROXIDE 400 MG/5ML PO SUSP: 30.0000 mL | Freq: Every day | ORAL | Status: DC

## 2020-12-10 MED ORDER — PANTOPRAZOLE SODIUM 40 MG PO TBEC
40.0000 mg | DELAYED_RELEASE_TABLET | Freq: Two times a day (BID) | ORAL | Status: DC
  Filled 2020-12-10: qty 1

## 2020-12-10 MED ORDER — NEOMYCIN-POLYMYXIN B GU 40-200000 IR SOLN
Status: DC | PRN
  Administered 2020-12-10: 16 mL

## 2020-12-10 MED ORDER — NEOMYCIN-POLYMYXIN B GU 40-200000 IR SOLN
Status: AC
  Filled 2020-12-10: qty 20

## 2020-12-10 MED ORDER — TRANEXAMIC ACID-NACL 1000-0.7 MG/100ML-% IV SOLN: 1000.0000 mg | Freq: Once | INTRAVENOUS | Status: AC

## 2020-12-10 MED ORDER — DIPHENHYDRAMINE HCL 12.5 MG/5ML PO ELIX
12.5000 mg | ORAL_SOLUTION | ORAL | Status: DC | PRN
Start: 2020-12-10 — End: 2020-12-10
  Filled 2020-12-10: qty 10

## 2020-12-10 MED ORDER — ACETAMINOPHEN 10 MG/ML IV SOLN
INTRAVENOUS | Status: AC
  Administered 2020-12-10: 1000 mg
  Filled 2020-12-10: qty 100

## 2020-12-10 MED ORDER — PROPOFOL 10 MG/ML IV BOLUS
INTRAVENOUS | Status: DC | PRN
  Administered 2020-12-10: 140 mg via INTRAVENOUS

## 2020-12-10 MED ORDER — TRAMADOL HCL 50 MG PO TABS
ORAL_TABLET | ORAL | Status: AC
  Filled 2020-12-10: qty 1

## 2020-12-10 MED ORDER — OXYCODONE HCL 5 MG/5ML PO SOLN: 5.0000 mg | Freq: Once | ORAL | Status: DC | PRN

## 2020-12-10 MED ORDER — PHENYLEPHRINE HCL (PRESSORS) 10 MG/ML IV SOLN
INTRAVENOUS | Status: DC | PRN
  Administered 2020-12-10 (×4): 100 ug via INTRAVENOUS
  Administered 2020-12-10: 50 ug via INTRAVENOUS
  Administered 2020-12-10 (×4): 100 ug via INTRAVENOUS
  Administered 2020-12-10 (×3): 50 ug via INTRAVENOUS
  Administered 2020-12-10 (×2): 100 ug via INTRAVENOUS
  Administered 2020-12-10: 150 ug via INTRAVENOUS

## 2020-12-10 MED ORDER — LACTATED RINGERS IV BOLUS
250.0000 mL | Freq: Once | INTRAVENOUS | Status: AC
  Administered 2020-12-10: 250 mL via INTRAVENOUS

## 2020-12-10 MED ORDER — ONDANSETRON HCL 4 MG/2ML IJ SOLN
INTRAMUSCULAR | Status: AC
  Filled 2020-12-10: qty 2

## 2020-12-10 MED ORDER — BUPIVACAINE LIPOSOME 1.3 % IJ SUSP
INTRAMUSCULAR | Status: AC
  Filled 2020-12-10: qty 20

## 2020-12-10 MED ORDER — FENTANYL CITRATE (PF) 100 MCG/2ML IJ SOLN: 25.0000 ug | INTRAMUSCULAR | Status: DC | PRN

## 2020-12-10 MED ORDER — PROPOFOL 10 MG/ML IV BOLUS
INTRAVENOUS | Status: AC
  Filled 2020-12-10: qty 20

## 2020-12-10 MED ORDER — PHENYLEPHRINE HCL (PRESSORS) 10 MG/ML IV SOLN
INTRAVENOUS | Status: AC
  Filled 2020-12-10: qty 1

## 2020-12-10 MED ORDER — PROPOFOL 500 MG/50ML IV EMUL
INTRAVENOUS | Status: DC | PRN
  Administered 2020-12-10: 75 ug/kg/min via INTRAVENOUS

## 2020-12-10 MED ORDER — SURGIPHOR WOUND IRRIGATION SYSTEM - OPTIME
TOPICAL | Status: DC | PRN
  Administered 2020-12-10: 450 mL via TOPICAL

## 2020-12-10 MED ORDER — LACTATED RINGERS IV BOLUS
500.0000 mL | Freq: Once | INTRAVENOUS | Status: AC
  Administered 2020-12-10: 500 mL via INTRAVENOUS

## 2020-12-10 MED ORDER — ACETAMINOPHEN 10 MG/ML IV SOLN
INTRAVENOUS | Status: AC
  Filled 2020-12-10: qty 100

## 2020-12-10 MED ORDER — KETAMINE HCL 10 MG/ML IJ SOLN
INTRAMUSCULAR | Status: DC | PRN
  Administered 2020-12-10 (×3): 20 mg via INTRAVENOUS
  Administered 2020-12-10: 30 mg via INTRAVENOUS
  Administered 2020-12-10: 10 mg via INTRAVENOUS

## 2020-12-10 MED ORDER — CEFAZOLIN SODIUM-DEXTROSE 2-4 GM/100ML-% IV SOLN
2.0000 g | Freq: Four times a day (QID) | INTRAVENOUS | Status: DC
  Administered 2020-12-10: 2 g via INTRAVENOUS

## 2020-12-10 MED ORDER — CELECOXIB 200 MG PO CAPS
ORAL_CAPSULE | ORAL | Status: AC
  Administered 2020-12-10: 400 mg via ORAL
  Filled 2020-12-10: qty 2

## 2020-12-10 MED ORDER — ALUM & MAG HYDROXIDE-SIMETH 200-200-20 MG/5ML PO SUSP: 30.0000 mL | ORAL | Status: DC | PRN

## 2020-12-10 MED ORDER — ENOXAPARIN SODIUM 40 MG/0.4ML ~~LOC~~ SOLN: 40.0000 mg | SUBCUTANEOUS | Status: DC

## 2020-12-10 MED ORDER — LIDOCAINE HCL (CARDIAC) PF 100 MG/5ML IV SOSY
PREFILLED_SYRINGE | INTRAVENOUS | Status: DC | PRN
  Administered 2020-12-10: 20 mg via INTRAVENOUS
  Administered 2020-12-10: 80 mg via INTRAVENOUS

## 2020-12-10 MED ORDER — SODIUM CHLORIDE FLUSH 0.9 % IV SOLN
INTRAVENOUS | Status: AC
  Filled 2020-12-10: qty 40

## 2020-12-10 MED ORDER — ENOXAPARIN SODIUM 60 MG/0.6ML ~~LOC~~ SOLN
0.5000 mg/kg | SUBCUTANEOUS | Status: DC
  Filled 2020-12-10: qty 0.6

## 2020-12-10 MED ORDER — PROPOFOL 500 MG/50ML IV EMUL
INTRAVENOUS | Status: AC
  Filled 2020-12-10: qty 50

## 2020-12-10 MED ORDER — DEXMEDETOMIDINE HCL 200 MCG/2ML IV SOLN
INTRAVENOUS | Status: DC | PRN
  Administered 2020-12-10: 8 ug via INTRAVENOUS
  Administered 2020-12-10: 4 ug via INTRAVENOUS
  Administered 2020-12-10: 8 ug via INTRAVENOUS

## 2020-12-10 MED ORDER — ROCURONIUM BROMIDE 10 MG/ML (PF) SYRINGE
PREFILLED_SYRINGE | INTRAVENOUS | Status: AC
  Filled 2020-12-10: qty 10

## 2020-12-10 MED ORDER — TRANEXAMIC ACID-NACL 1000-0.7 MG/100ML-% IV SOLN
INTRAVENOUS | Status: AC
  Administered 2020-12-10: 1000 mg via INTRAVENOUS
  Filled 2020-12-10: qty 100

## 2020-12-10 MED ORDER — EPHEDRINE SULFATE 50 MG/ML IJ SOLN
INTRAMUSCULAR | Status: DC | PRN
  Administered 2020-12-10: 5 mg via INTRAVENOUS
  Administered 2020-12-10 (×2): 10 mg via INTRAVENOUS

## 2020-12-10 MED ORDER — OXYCODONE HCL 5 MG PO TABS: 5.0000 mg | ORAL_TABLET | ORAL | Status: DC | PRN

## 2020-12-10 MED ORDER — METOCLOPRAMIDE HCL 10 MG PO TABS: 10.0000 mg | ORAL_TABLET | Freq: Three times a day (TID) | ORAL | Status: DC

## 2020-12-10 MED ORDER — FENTANYL CITRATE (PF) 100 MCG/2ML IJ SOLN
INTRAMUSCULAR | Status: AC
  Filled 2020-12-10: qty 2

## 2020-12-10 MED ORDER — SUGAMMADEX SODIUM 200 MG/2ML IV SOLN
INTRAVENOUS | Status: DC | PRN
  Administered 2020-12-10: 200 mg via INTRAVENOUS

## 2020-12-10 MED ORDER — OXYCODONE HCL 5 MG PO TABS: 5.0000 mg | ORAL_TABLET | Freq: Once | ORAL | Status: DC | PRN

## 2020-12-10 MED ORDER — CELECOXIB 200 MG PO CAPS: 200.0000 mg | ORAL_CAPSULE | Freq: Two times a day (BID) | ORAL | 2 refills | Status: DC

## 2020-12-10 MED ORDER — FLEET ENEMA 7-19 GM/118ML RE ENEM: 1.0000 | ENEMA | Freq: Once | RECTAL | Status: DC | PRN

## 2020-12-10 MED ORDER — ACETAMINOPHEN 10 MG/ML IV SOLN: 1000.0000 mg | Freq: Four times a day (QID) | INTRAVENOUS | Status: DC

## 2020-12-10 MED ORDER — ENOXAPARIN SODIUM 40 MG/0.4ML ~~LOC~~ SOLN: 40.0000 mg | SUBCUTANEOUS | 0 refills | Status: DC

## 2020-12-10 MED ORDER — ONDANSETRON HCL 4 MG/2ML IJ SOLN: 4.0000 mg | Freq: Four times a day (QID) | INTRAMUSCULAR | Status: DC | PRN

## 2020-12-10 MED ORDER — PHENOL 1.4 % MT LIQD
1.0000 | OROMUCOSAL | Status: DC | PRN
  Filled 2020-12-10: qty 177

## 2020-12-10 MED ORDER — CEFAZOLIN SODIUM-DEXTROSE 2-4 GM/100ML-% IV SOLN
INTRAVENOUS | Status: AC
  Filled 2020-12-10: qty 100

## 2020-12-10 MED ORDER — ONDANSETRON HCL 4 MG/2ML IJ SOLN: 4.0000 mg | Freq: Once | INTRAMUSCULAR | Status: DC | PRN

## 2020-12-10 MED ORDER — ACETAMINOPHEN 10 MG/ML IV SOLN
INTRAVENOUS | Status: DC | PRN
  Administered 2020-12-10: 1000 mg via INTRAVENOUS

## 2020-12-10 MED ORDER — ACETAMINOPHEN 325 MG PO TABS: 325.0000 mg | ORAL_TABLET | Freq: Four times a day (QID) | ORAL | Status: DC | PRN

## 2020-12-10 MED ORDER — ONDANSETRON HCL 4 MG/2ML IJ SOLN
INTRAMUSCULAR | Status: DC | PRN
  Administered 2020-12-10: 4 mg via INTRAVENOUS

## 2020-12-10 MED ORDER — BUPIVACAINE HCL (PF) 0.5 % IJ SOLN
INTRAMUSCULAR | Status: AC
  Filled 2020-12-10: qty 10

## 2020-12-10 MED ORDER — TRANEXAMIC ACID-NACL 1000-0.7 MG/100ML-% IV SOLN
INTRAVENOUS | Status: AC
  Filled 2020-12-10: qty 100

## 2020-12-10 MED ORDER — BISACODYL 10 MG RE SUPP
10.0000 mg | Freq: Every day | RECTAL | Status: DC | PRN
  Filled 2020-12-10: qty 1

## 2020-12-10 MED ORDER — BUPIVACAINE HCL (PF) 0.25 % IJ SOLN
INTRAMUSCULAR | Status: DC | PRN
  Administered 2020-12-10: 60 mL

## 2020-12-10 MED ORDER — OXYCODONE HCL 5 MG PO TABS: 5.0000 mg | ORAL_TABLET | ORAL | 0 refills | Status: DC | PRN

## 2020-12-10 MED ORDER — SODIUM CHLORIDE 0.9 % IV SOLN
INTRAVENOUS | Status: DC | PRN
  Administered 2020-12-10: 60 mL

## 2020-12-10 MED ORDER — DEXAMETHASONE SODIUM PHOSPHATE 10 MG/ML IJ SOLN
INTRAMUSCULAR | Status: AC
  Administered 2020-12-10: 8 mg via INTRAVENOUS
  Filled 2020-12-10: qty 1

## 2020-12-10 MED ORDER — SODIUM CHLORIDE 0.9 % IV SOLN: INTRAVENOUS | Status: DC

## 2020-12-10 MED ORDER — SUCCINYLCHOLINE CHLORIDE 200 MG/10ML IV SOSY
PREFILLED_SYRINGE | INTRAVENOUS | Status: AC
  Filled 2020-12-10: qty 10

## 2020-12-10 MED ORDER — CHLORHEXIDINE GLUCONATE 0.12 % MT SOLN
OROMUCOSAL | Status: AC
  Administered 2020-12-10: 15 mL via OROMUCOSAL
  Filled 2020-12-10: qty 15

## 2020-12-10 MED ORDER — ROCURONIUM BROMIDE 100 MG/10ML IV SOLN
INTRAVENOUS | Status: DC | PRN
  Administered 2020-12-10: 10 mg via INTRAVENOUS
  Administered 2020-12-10 (×2): 20 mg via INTRAVENOUS
  Administered 2020-12-10: 50 mg via INTRAVENOUS

## 2020-12-10 MED ORDER — FAMOTIDINE 20 MG PO TABS
ORAL_TABLET | ORAL | Status: AC
  Administered 2020-12-10: 20 mg via ORAL
  Filled 2020-12-10: qty 1

## 2020-12-10 MED ORDER — CELECOXIB 200 MG PO CAPS: 200.0000 mg | ORAL_CAPSULE | Freq: Two times a day (BID) | ORAL | Status: DC

## 2020-12-10 MED ORDER — FENTANYL CITRATE (PF) 100 MCG/2ML IJ SOLN
INTRAMUSCULAR | Status: AC
  Administered 2020-12-10: 25 ug via INTRAVENOUS
  Filled 2020-12-10: qty 2

## 2020-12-10 MED ORDER — SENNOSIDES-DOCUSATE SODIUM 8.6-50 MG PO TABS
1.0000 | ORAL_TABLET | Freq: Two times a day (BID) | ORAL | Status: DC
  Filled 2020-12-10: qty 1

## 2020-12-10 MED ORDER — SUCCINYLCHOLINE CHLORIDE 20 MG/ML IJ SOLN
INTRAMUSCULAR | Status: DC | PRN
  Administered 2020-12-10: 100 mg via INTRAVENOUS

## 2020-12-10 MED ORDER — BUPIVACAINE HCL (PF) 0.25 % IJ SOLN
INTRAMUSCULAR | Status: AC
  Filled 2020-12-10: qty 60

## 2020-12-10 MED ORDER — FENTANYL CITRATE (PF) 100 MCG/2ML IJ SOLN
INTRAMUSCULAR | Status: DC | PRN
  Administered 2020-12-10: 50 ug via INTRAVENOUS

## 2020-12-10 MED ORDER — ONDANSETRON HCL 4 MG PO TABS: 4.0000 mg | ORAL_TABLET | Freq: Four times a day (QID) | ORAL | Status: DC | PRN

## 2020-12-10 MED ORDER — DEXMEDETOMIDINE (PRECEDEX) IN NS 20 MCG/5ML (4 MCG/ML) IV SYRINGE
PREFILLED_SYRINGE | INTRAVENOUS | Status: AC
  Filled 2020-12-10: qty 5

## 2020-12-10 MED ORDER — HYDROMORPHONE HCL 1 MG/ML IJ SOLN: 0.5000 mg | INTRAMUSCULAR | Status: DC | PRN

## 2020-12-10 SURGICAL SUPPLY — 79 items
ATTUNE MED DOME PAT 38 KNEE (Knees) ×2 IMPLANT
ATTUNE PSFEM LTSZ5 NARCEM KNEE (Femur) ×2 IMPLANT
BASEPLATE TIBIAL ROTATING SZ 4 (Knees) ×2 IMPLANT
BATTERY INSTRU NAVIGATION (MISCELLANEOUS) ×8 IMPLANT
BLADE SAW 70X12.5 (BLADE) ×2 IMPLANT
BLADE SAW 90X13X1.19 OSCILLAT (BLADE) ×2 IMPLANT
BLADE SAW 90X25X1.19 OSCILLAT (BLADE) ×2 IMPLANT
BSPLAT TIB 4 CMNT ROT PLAT STR (Knees) ×1 IMPLANT
BTRY SRG DRVR LF (MISCELLANEOUS) ×4
CANISTER PREVENA PLUS 150 (CANNISTER) ×2 IMPLANT
CANISTER SUCT 3000ML PPV (MISCELLANEOUS) ×2 IMPLANT
COOLER POLAR GLACIER W/PUMP (MISCELLANEOUS) ×2 IMPLANT
COVER WAND RF STERILE (DRAPES) ×2 IMPLANT
CUFF TOURN SGL QUICK 30 (TOURNIQUET CUFF) ×2
CUFF TRNQT CYL 30X4X21-28X (TOURNIQUET CUFF) ×1 IMPLANT
DRAPE 3/4 80X56 (DRAPES) ×2 IMPLANT
DRESSING PEEL AND PLAC PRVNA20 (GAUZE/BANDAGES/DRESSINGS) ×1 IMPLANT
DRSG DERMACEA 8X12 NADH (GAUZE/BANDAGES/DRESSINGS) ×2 IMPLANT
DRSG MEPILEX SACRM 8.7X9.8 (GAUZE/BANDAGES/DRESSINGS) ×2 IMPLANT
DRSG OPSITE POSTOP 4X14 (GAUZE/BANDAGES/DRESSINGS) ×2 IMPLANT
DRSG PEEL AND PLACE PREVENA 20 (GAUZE/BANDAGES/DRESSINGS) ×2
DRSG TEGADERM 4X4.75 (GAUZE/BANDAGES/DRESSINGS) ×2 IMPLANT
DURAPREP 26ML APPLICATOR (WOUND CARE) ×4 IMPLANT
ELECT REM PT RETURN 9FT ADLT (ELECTROSURGICAL) ×2
ELECTRODE REM PT RTRN 9FT ADLT (ELECTROSURGICAL) ×1 IMPLANT
EX-PIN ORTHOLOCK NAV 4X150 (PIN) ×4 IMPLANT
GLOVE BIO SURGEON STRL SZ7.5 (GLOVE) ×4 IMPLANT
GLOVE BIOGEL PI IND STRL 7.5 (GLOVE) ×1 IMPLANT
GLOVE BIOGEL PI INDICATOR 7.5 (GLOVE) ×1
GLOVE INDICATOR 8.0 STRL GRN (GLOVE) ×2 IMPLANT
GLOVE SURG ENC TEXT LTX SZ7.5 (GLOVE) ×4 IMPLANT
GOWN STRL REUS W/ TWL LRG LVL3 (GOWN DISPOSABLE) ×2 IMPLANT
GOWN STRL REUS W/ TWL XL LVL3 (GOWN DISPOSABLE) ×1 IMPLANT
GOWN STRL REUS W/TWL LRG LVL3 (GOWN DISPOSABLE) ×4
GOWN STRL REUS W/TWL XL LVL3 (GOWN DISPOSABLE) ×2
HEMOVAC 400CC 10FR (MISCELLANEOUS) ×2 IMPLANT
HOLDER FOLEY CATH W/STRAP (MISCELLANEOUS) ×2 IMPLANT
HOOD PEEL AWAY FLYTE STAYCOOL (MISCELLANEOUS) ×4 IMPLANT
IMMBOLIZER KNEE 19 BLUE UNIV (SOFTGOODS) ×2 IMPLANT
INSERT TIBIAL KNEE SZ 5 12MM (Insert) ×1 IMPLANT
IRRIGATION SURGIPHOR STRL (IV SOLUTION) ×2 IMPLANT
KIT PREVENA INCISION MGT20CM45 (CANNISTER) ×2 IMPLANT
KIT PUMP PREVENA PLUS 14DAY (MISCELLANEOUS) ×2 IMPLANT
KIT TURNOVER KIT A (KITS) ×2 IMPLANT
KNIFE SCULPS 14X20 (INSTRUMENTS) ×2 IMPLANT
LABEL OR SOLS (LABEL) ×2 IMPLANT
MANIFOLD NEPTUNE II (INSTRUMENTS) ×4 IMPLANT
NDL SAFETY ECLIPSE 18X1.5 (NEEDLE) ×1 IMPLANT
NEEDLE HYPO 18GX1.5 SHARP (NEEDLE) ×2
NEEDLE SPNL 20GX3.5 QUINCKE YW (NEEDLE) ×4 IMPLANT
NS IRRIG 500ML POUR BTL (IV SOLUTION) ×2 IMPLANT
PACK TOTAL KNEE (MISCELLANEOUS) ×2 IMPLANT
PAD WRAPON POLAR KNEE (MISCELLANEOUS) ×1 IMPLANT
PENCIL SMOKE ULTRAEVAC 22 CON (MISCELLANEOUS) ×2 IMPLANT
PIN DRILL QUICK PACK ×2 IMPLANT
PIN FIXATION 1/8DIA X 3INL (PIN) ×6 IMPLANT
PULSAVAC PLUS IRRIG FAN TIP (DISPOSABLE) ×2
SOL .9 NS 3000ML IRR  AL (IV SOLUTION) ×1
SOL .9 NS 3000ML IRR AL (IV SOLUTION) ×1
SOL .9 NS 3000ML IRR UROMATIC (IV SOLUTION) ×1 IMPLANT
SOL PREP PVP 2OZ (MISCELLANEOUS) ×2
SOLUTION PREP PVP 2OZ (MISCELLANEOUS) ×1 IMPLANT
SPONGE DRAIN TRACH 4X4 STRL 2S (GAUZE/BANDAGES/DRESSINGS) ×2 IMPLANT
STAPLER SKIN PROX 35W (STAPLE) ×2 IMPLANT
STOCKINETTE IMPERV 14X48 (MISCELLANEOUS) IMPLANT
STRAP TIBIA SHORT (MISCELLANEOUS) ×2 IMPLANT
SUCTION FRAZIER HANDLE 10FR (MISCELLANEOUS) ×1
SUCTION TUBE FRAZIER 10FR DISP (MISCELLANEOUS) ×1 IMPLANT
SUT VIC AB 0 CT1 36 (SUTURE) ×4 IMPLANT
SUT VIC AB 1 CT1 36 (SUTURE) ×4 IMPLANT
SUT VIC AB 2-0 CT2 27 (SUTURE) ×2 IMPLANT
SYR 20ML LL LF (SYRINGE) ×2 IMPLANT
SYR 30ML LL (SYRINGE) ×4 IMPLANT
TIBIAL INSERT KNEE SZ 5 12MM (Insert) ×2 IMPLANT
TIP FAN IRRIG PULSAVAC PLUS (DISPOSABLE) ×1 IMPLANT
TOWEL OR 17X26 4PK STRL BLUE (TOWEL DISPOSABLE) ×2 IMPLANT
TOWER CARTRIDGE SMART MIX (DISPOSABLE) ×2 IMPLANT
TRAY FOLEY MTR SLVR 16FR STAT (SET/KITS/TRAYS/PACK) ×2 IMPLANT
WRAPON POLAR PAD KNEE (MISCELLANEOUS) ×2

## 2020-12-10 NOTE — Progress Notes (Signed)
PHARMACIST - PHYSICIAN COMMUNICATION  CONCERNING:  Enoxaparin (Lovenox) for DVT Prophylaxis    RECOMMENDATION: Patient was prescribed enoxaprin 40mg  q24 hours for VTE prophylaxis.   Filed Weights   12/10/20 0649  Weight: 98.9 kg (218 lb)    Body mass index is 36.28 kg/m.  Estimated Creatinine Clearance: 59.8 mL/min (by C-G formula based on SCr of 0.86 mg/dL).   Based on [place] patient is candidate for enoxaparin 0.5mg /kg TBW SQ every 24 hours based on BMI being >30.  DESCRIPTION: Pharmacy has adjusted enoxaparin dose per [facility] policy.  Patient is now receiving enoxaparin 50 mg every 24 hours    Berta Minor, PharmD Clinical Pharmacist  12/10/2020 1:06 PM

## 2020-12-10 NOTE — TOC Initial Note (Signed)
Transition of Care [facility]) - Initial/Assessment Note    Patient Details  Name: Laurie Roy MRN: [id number] Date of Birth: May 27, 1941  [facility]) CM/SW Contact:    Laurie Ammons, RN Phone Number: 12/10/2020, 3:49 PM  Clinical Narrative:   RNCM ordered patient 3N1 through [employer], outreached to Laurie Roy with [facility], they are ready to service patient with home health once she gets home.                    Patient Goals and CMS Choice        Expected Discharge Plan and Services           Expected Discharge Date: 12/10/20                                    Prior Living Arrangements/Services                       Activities of Daily Living      Permission Sought/Granted                  Emotional Assessment              Admission diagnosis:  osteoarthritis Patient Active Problem List   Diagnosis Date Noted  . IGT (impaired glucose tolerance) 12/10/2020  . Neuropathy 12/10/2020  . Obesity (BMI 30-39.9) 12/10/2020  . Primary osteoarthritis of left knee 09/16/2020  . Primary osteoarthritis of right knee 09/16/2020  . Polyarthralgia 03/26/2017  . Vitamin B12 deficiency 02/01/2017  . Vitamin D deficiency 02/01/2017  . HTN (hypertension) 02/17/2014  . Hypercholesterolemia 02/17/2014   PCP:  Laurie Lange, NP Pharmacy:   [place], [place] - [address] [place] [address] Phone: [phone] Fax: [phone]     Social Determinants of Health (SDOH) Interventions    Readmission Risk Interventions No flowsheet data found.

## 2020-12-10 NOTE — Evaluation (Signed)
Occupational Therapy Evaluation Patient Details Name: Laurie Roy MRN: [id number] DOB: 02-02-1941 Today's Date: 12/10/2020    History of Present Illness Pt is an 80 y.o. female s/p L TKA 2/7; pt with 2 year h/o B knee pain L>R.  PMH includes anxiety, breast CA s/p L mastectomy, htn, neuropathy, L knee arthroscopy 1997, sleep apnea.   Clinical Impression   Laurie Roy was seen for OT evaluation this date, POD#0. Prior to Roy admission, pt was MOD I for mobility and ADLs using SPC. Pt lives alone in 2 level home, with son planning to stay a full week for 24/7. Pt presents to acute OT demonstrating impaired ADL performance and functional mobility 2/2 decreased activity tolerance and functional strength/ROM/balance deficits. Pt currently requires MIN A for LBD while in seated position due to pain and limited AROM of L knee. Pt instructed in polar care mgt, falls prevention strategies, home/routines modifications, DME/AE for LB bathing and dressing tasks, and compression stocking mgt. Pt would benefit from skilled OT services including additional instruction in dressing techniques with or without assistive devices for dressing and bathing skills to support recall and carryover prior to discharge and ultimately to maximize safety, independence, and minimize falls risk and caregiver burden. Upon Roy discharge, recommend HHOT to maximize pt safety and return to functional independence during meaningful occupations of daily life.      Follow Up Recommendations  Home health OT;Supervision/Assistance - 24 hour    Equipment Recommendations  3 in 1 bedside commode    Recommendations for Other Services       Precautions / Restrictions Precautions Precautions: Fall;Knee Precaution Booklet Issued: Yes (comment) Precaution Comments: wound vac Restrictions Weight Bearing Restrictions: Yes LLE Weight Bearing: Weight bearing as tolerated      Mobility Bed Mobility Overal bed mobility: Needs  Assistance Bed Mobility: Supine to Sit     Supine to sit: Supervision;HOB elevated     General bed mobility comments: pt received and left in chair    Transfers Overall transfer level: Needs assistance Equipment used: Rolling walker (2 wheeled) Transfers: Sit to/from Stand Sit to Stand: Min guard         General transfer comment: VCs for technique    Balance Overall balance assessment: Needs assistance Sitting-balance support: No upper extremity supported;Feet supported Sitting balance-Laurie Roy: Good Sitting balance - Comments: steady sitting reaching outside BOS   Standing balance support: No upper extremity supported;During functional activity Standing balance-Laurie Roy: Fair Standing balance comment: requires CGA for dynamic standing                           ADL either performed or assessed with clinical judgement   ADL Overall ADL's : Needs assistance/impaired                                       General ADL Comments: CGA + RW for ADL t/f. MIN A don/doff underwear seated EOB - assist for threading over B feet and dynamic standing balance.                  Pertinent Vitals/Pain Pain Assessment: 0-10 Pain Score: 2  Pain Location: L knee Pain Descriptors / Indicators: Sore Pain Intervention(s): Limited activity within patient's tolerance;Premedicated before session;Repositioned     Hand Dominance     Extremity/Trunk Assessment Upper Extremity Assessment Upper Extremity Assessment: Overall Laurie Roy  for tasks assessed   Lower Extremity Assessment Lower Extremity Assessment: Defer to PT evaluation LLE Deficits / Details: able to perform L LE SLR; at least 3/5 hip flexion and ankle DF/PF LLE: Unable to fully assess due to pain   Cervical / Trunk Assessment Cervical / Trunk Assessment: Other exceptions (forward Laurie Roy)   Communication Communication Communication: No difficulties   Cognition Arousal/Alertness:  Awake/alert Behavior During Therapy: WFL for tasks assessed/performed Overall Cognitive Status: Within Functional Limits for tasks assessed                                     General Comments  L knee dressings in place with hemovac    Exercises Exercises: Other exercises  Other Exercises Other Exercises: Pt educated re: OT role, DME recs, d/c recs, falls prevention, ECS, pet mgmt, polar care mgmt, compression stocking mgmt Other Exercises: LBD, simulated toileting, sit<>stand, sitting.standing balance/tolerance   Shoulder Instructions      Home Living Family/patient expects to be discharged to:: Private residence Living Arrangements: Alone Available Help at Discharge: Family (pt's son providing 24/7 assist for 1 week upon Roy discharge) Type of Home: House Home Access: Stairs to enter Laurie Roy of Steps: 4 platform steps from front (able to fit walker on) and 4 steps with R rail from garage   Home Layout: Two level;1/2 bath on main level;Able to live on main level with bedroom/bathroom Alternate Level Stairs-Number of Steps: flight of steps with B railing partway up and then L railing rest of way ascending   Bathroom Shower/Tub: Tub/shower unit   Bathroom Toilet: Standard     Home Equipment: Environmental consultant - 2 wheels;Cane - single point;Grab bars - tub/shower   Additional Comments: Friends able to check in on pt too      Prior Functioning/Environment Level of Independence: Independent with assistive device(s)        Comments: Ambulating with SPC shorter household distances.  No falls in past 6 months.        OT Problem List: Decreased strength;Decreased activity tolerance;Impaired balance (sitting and/or standing)      OT Treatment/Interventions: Self-care/ADL training;Therapeutic exercise;Roy conservation;DME and/or AE instruction;Therapeutic activities;Patient/family education;Balance training    OT Goals(Current goals can be found  in the care plan section) Acute Rehab OT Goals Patient Stated Goal: to go home OT Goal Formulation: With patient Time For Goal Achievement: 12/24/20 Potential to Achieve Goals: Good ADL Goals Pt Will Perform Lower Body Dressing: with modified independence;sit to/from stand (c LRAD & AE PRN) Pt Will Transfer to Toilet: with modified independence;ambulating;regular height toilet (c LRAD PRN) Additional ADL Goal #1: Pt will Independently verbalize plan to implement x3 falls prevention strategies.  OT Frequency: Min 1X/week   Barriers to D/C:               AM-PAC OT "6 Clicks" Daily Activity     Outcome Measure Help from another person eating meals?: None Help from another person taking care of personal grooming?: A Little Help from another person toileting, which includes using toliet, bedpan, or urinal?: A Little Help from another person bathing (including washing, rinsing, drying)?: A Little Help from another person to put on and taking off regular upper body clothing?: None Help from another person to put on and taking off regular lower body clothing?: A Little 6 Click Score: 20   End of Session Equipment Utilized During Treatment: Rolling walker Nurse Communication: Mobility  status  Activity Tolerance: Patient tolerated treatment well Patient left: in chair;with call bell/phone within reach;with nursing/sitter in room  OT Visit Diagnosis: Other abnormalities of gait and mobility (R26.89)                Time: [id number] OT Time Calculation (min): 26 min Charges:  OT General Charges $OT Visit: 1 Visit OT Evaluation $OT Eval Low Complexity: 1 Low OT Treatments $Self Care/Home Management : 23-37 mins  Dessie Coma, M.S. OTR/L  12/10/20, 4:44 PM  ascom [phone]

## 2020-12-10 NOTE — OR Nursing (Signed)
OT in for evaluation 1500.

## 2020-12-10 NOTE — Evaluation (Signed)
Physical Therapy Evaluation Patient Details Name: Laurie Roy MRN: [id number] DOB: July 11, 1941 Today's Date: 12/10/2020   History of Present Illness  Pt is an 80 y.o. female s/p L TKA 2/7; pt with 2 year h/o B knee pain L>R.  PMH includes anxiety, breast CA s/p L mastectomy, htn, neuropathy, L knee arthroscopy 1997, sleep apnea.  Clinical Impression  Prior to hospital admission, pt was ambulatory with [facility]; lives alone but pt's son plans to stay with pt for next week to assist 24/7; plans to stay on main level; 4 steps to enter home.  Currently pt is SBA semi-supine to sitting edge of bed; CGA with transfers; CGA to ambulate 40 feet with RW (pt reports this is typical distance she is able to walk at home); and CGA to min assist x2 (plus 3rd assist for safety) to navigate steps with B UE support or platform step set-up (pt unable to ascend 1 step with R railing as set-up in garage entrance; pt plans to enter from front entrance with 4 platform steps).  L knee ROM 0-80 degrees.  Able to perform L LE SLR independently.  Pt would benefit from skilled PT to address noted impairments and functional limitations (see below for any additional details).  Upon hospital discharge, pt would benefit from HHPT and 24/7 assist; pt will also require assist for stairs navigation to safely enter home.    Follow Up Recommendations Home health PT;Supervision/Assistance - 24 hour (assist for stairs)    Equipment Recommendations  Rolling walker with 5" wheels;3in1 (PT)    Recommendations for Other Services OT consult     Precautions / Restrictions Precautions Precautions: Fall;Knee Precaution Booklet Issued: Yes (comment) Precaution Comments: wound vac Restrictions Weight Bearing Restrictions: Yes LLE Weight Bearing: Weight bearing as tolerated      Mobility  Bed Mobility Overal bed mobility: Needs Assistance Bed Mobility: Supine to Sit     Supine to sit: Supervision;HOB elevated     General bed  mobility comments: increased effort and time to perform on own; use of bed rails    Transfers Overall transfer level: Needs assistance Equipment used: Rolling walker (2 wheeled) Transfers: Sit to/from Stand Sit to Stand: Min guard         General transfer comment: x1 trial from bed and x2 trials from recliner; pt requiring initial vc's for UE/LE placement; pt using some momentum to stand and requiring 2-3 attempts in order to stand each trial  Ambulation/Gait Ambulation/Gait assistance: Min guard Gait Distance (Feet): 40 Feet Assistive device: Rolling walker (2 wheeled)   Gait velocity: decreased   General Gait Details: antalgic; decreased stance time L LE; initial vc's for gait technique  Stairs Stairs: Yes Stairs assistance: Min guard;Min assist;+2 safety/equipment (3rd assist for safety and walker stability on platform step)   Number of Stairs:  (ascended 3 steps forwards and 3 steps backwards with B railings; ascended 2 platform steps forwards (B UE's on RW) and 2 platform steps backwards (B UE's on B railings)) General stair comments: vc's for stairs technique; increased effort to perform  Wheelchair Mobility    Modified Rankin (Stroke Patients Only)       Balance Overall balance assessment: Needs assistance Sitting-balance support: No upper extremity supported;Feet supported Sitting balance-Leahy Scale: Normal Sitting balance - Comments: steady sitting reaching outside BOS   Standing balance support: No upper extremity supported Standing balance-Leahy Scale: Good Standing balance comment: steady standing adjusting underwear with B UE's  Pertinent Vitals/Pain Pain Assessment: 0-10 Pain Score: 2  Pain Location: L knee Pain Descriptors / Indicators: Sore Pain Intervention(s): Limited activity within patient's tolerance;Monitored during session;Premedicated before session;Repositioned  Vitals (HR and O2 on room air) stable  and WFL throughout treatment session.    Home Living Family/patient expects to be discharged to:: Private residence Living Arrangements: Alone Available Help at Discharge: Family (pt's son providing 24/7 assist for 1 week upon hospital discharge) Type of Home: House Home Access: Stairs to enter   [employer] of Steps: 4 platform steps from front (able to fit walker on) and 4 steps with R rail from garage [place]: Two level;1/2 bath on main level;Able to live on main level with bedroom/bathroom Home Equipment: Gilford Rile - 2 wheels;Cane - single point;Grab bars - tub/shower Additional Comments: Friends able to check in on pt too    Prior Function Level of Independence: Independent with assistive device(s)         Comments: Ambulating with SPC shorter household distances.  No falls in past 6 months.     Hand Dominance        Extremity/Trunk Assessment   Upper Extremity Assessment Upper Extremity Assessment: Overall WFL for tasks assessed    Lower Extremity Assessment Lower Extremity Assessment: LLE deficits/detail (at least 4/5 hip flexion, knee flexion/extension, and DF/PF) LLE Deficits / Details: able to perform L LE SLR; at least 3/5 hip flexion and ankle DF/PF LLE: Unable to fully assess due to pain    Cervical / Trunk Assessment Cervical / Trunk Assessment: Other exceptions (forward head/shoulders)  Communication   Communication: No difficulties  Cognition Arousal/Alertness: Awake/alert Behavior During Therapy: WFL for tasks assessed/performed Overall Cognitive Status: Within Functional Limits for tasks assessed                                        General Comments General comments (skin integrity, edema, etc.): L knee dressings in place with hemovac.  Nursing cleared pt for participation in physical therapy.  Pt agreeable to PT session.    Exercises Total Joint Exercises Ankle Circles/Pumps: AROM;Strengthening;Both;10  reps;Supine Quad Sets: AROM;Strengthening;Both;10 reps;Supine Short Arc Quad: AROM;Strengthening;Left;10 reps;Supine Heel Slides: AAROM;Strengthening;Left;10 reps;Supine Hip ABduction/ADduction: AAROM;Strengthening;Left;10 reps;Supine Straight Leg Raises: AAROM;AROM;Strengthening;Left;10 reps;Supine Goniometric ROM: L knee ROM 0-80 degrees   Assessment/Plan    PT Assessment Patient needs continued PT services  PT Problem List Decreased strength;Decreased range of motion;Decreased activity tolerance;Decreased balance;Decreased mobility;Decreased knowledge of use of DME;Decreased knowledge of precautions;Pain;Decreased skin integrity       PT Treatment Interventions DME instruction;Gait training;Stair training;Functional mobility training;Therapeutic activities;Therapeutic exercise;Balance training;Patient/family education    PT Goals (Current goals can be found in the Care Plan section)  Acute Rehab PT Goals Patient Stated Goal: to go home PT Goal Formulation: With patient Time For Goal Achievement: 12/24/20 Potential to Achieve Goals: Good    Frequency BID   Barriers to discharge        Co-evaluation               AM-PAC PT "6 Clicks" Mobility  Outcome Measure Help needed turning from your back to your side while in a flat bed without using bedrails?: A Little Help needed moving from lying on your back to sitting on the side of a flat bed without using bedrails?: A Little Help needed moving to and from a bed to a chair (including a wheelchair)?: A Little Help  needed standing up from a chair using your arms (e.g., wheelchair or bedside chair)?: A Little Help needed to walk in hospital room?: A Little Help needed climbing 3-5 steps with a railing? : Total 6 Click Score: 16    End of Session Equipment Utilized During Treatment: Gait belt Activity Tolerance: Patient tolerated treatment well Patient left: in chair;Other (comment) (with OT present for OT  evaluation) Nurse Communication: Mobility status;Precautions;Weight bearing status (MD Hooten notified of pt's functional status and assist levels) PT Visit Diagnosis: Other abnormalities of gait and mobility (R26.89);Muscle weakness (generalized) (M62.81);Difficulty in walking, not elsewhere classified (R26.2);Pain Pain - Right/Left: Left Pain - part of body: Knee    Time: 1497-0263 PT Time Calculation (min) (ACUTE ONLY): 70 min   Charges:   PT Evaluation $PT Eval Low Complexity: 1 Low PT Treatments $Gait Training: 23-37 mins $Therapeutic Exercise: 8-22 mins $Therapeutic Activity: 8-22 mins       Leitha Bleak, PT 12/10/20, 3:57 PM

## 2020-12-10 NOTE — OR Nursing (Signed)
Discharge pending son's arrival for 2nd PT evaluation with stairs, as requested by Dr. Marry Guan.

## 2020-12-10 NOTE — Progress Notes (Addendum)
Physical Therapy Treatment Patient Details Name: Laurie Roy MRN: [id number] DOB: 1941/06/04 Today's Date: 12/10/2020    History of Present Illness Pt is an 80 y.o. female s/p L TKA 2/7; pt with 2 year h/o B knee pain L>R.  PMH includes anxiety, breast CA s/p L mastectomy, htn, neuropathy, L knee arthroscopy 1997, sleep apnea.    PT Comments    Pt resting in recliner upon PT arrival; pt's son present.  Performed stairs training with pt's son.  Brought different step this session to simulate navigating platform steps at home (pt's son reports pt has only 2 steps at home to enter from front and walker should fit on both of them).  Pt able to navigate 3 steps with RW use (CGA plus 2nd assist for safety) and pt's son and pt able to work together to safely navigate steps.  Issued pt gait belt for use for stairs safety per pt's son request.  Pt and pt's son report no further questions or concerns for discharge home today.  MD Hooten present observing pt's mobility during session.  Plan for HHPT.   Follow Up Recommendations  Home health PT;Supervision/Assistance - 24 hour (assist for stairs)     Equipment Recommendations  Rolling walker with 5" wheels;3in1 (PT)    Recommendations for Other Services OT consult     Precautions / Restrictions Precautions Precautions: Fall;Knee Precaution Booklet Issued: Yes (comment) Precaution Comments: wound vac Restrictions Weight Bearing Restrictions: Yes LLE Weight Bearing: Weight bearing as tolerated    Mobility  Bed Mobility          Transfers Overall transfer level: Needs assistance Equipment used: Rolling walker (2 wheeled) Transfers: Sit to/from Stand Sit to Stand: Min guard         General transfer comment: increased effort to stand up on own from recliner; vc's to bring L LE forward a little prior to sitting and reach back for chair prior to sitting (slow down descent sitting)  Ambulation/Gait       Stairs Stairs:  Yes Stairs assistance: Min guard;+2 safety/equipment Stair Management: Step to pattern;Forwards;With walker Number of Stairs: 3 General stair comments: ascended/descended 3 platform type steps (simulating home set up) with RW use; initial vc's for technique required and then pt and pt's son able to verbalize technique/cueing   Wheelchair Mobility    Modified Rankin (Stroke Patients Only)       Balance Overall balance assessment: Needs assistance Sitting-balance support: No upper extremity supported;Feet supported Sitting balance-Leahy Scale: Normal Sitting balance - Comments: steady sitting reaching outside BOS   Standing balance support: Bilateral upper extremity supported;During functional activity Standing balance-Leahy Scale: Good Standing balance comment: steady ambulating with RW                            Cognition Arousal/Alertness: Awake/alert Behavior During Therapy: WFL for tasks assessed/performed Overall Cognitive Status: Within Functional Limits for tasks assessed                                        Exercises     General Comments General comments (skin integrity, edema, etc.): L knee dressings in place with hemovac      Pertinent Vitals/Pain Pain Assessment: 0-10 Pain Score: 2  Faces Pain Scale: Hurts a little bit Pain Location: L knee Pain Descriptors / Indicators: Sore Pain Intervention(s): Limited activity  within patient's tolerance;Premedicated before session;Repositioned    Home Living Family/patient expects to be discharged to:: Private residence Living Arrangements: Alone Available Help at Discharge: Family (pt's son providing 24/7 assist for 1 week upon hospital discharge) Type of Home: House Home Access: Stairs to enter   [place]: Two level;1/2 bath on main level;Able to live on main level with bedroom/bathroom Home Equipment: Gilford Rile - 2 wheels;Cane - single point;Grab bars - tub/shower Additional  Comments: Friends able to check in on pt too    Prior Function Level of Independence: Independent with assistive device(s)      Comments: Ambulating with SPC shorter household distances.  No falls in past 6 months.   PT Goals (current goals can now be found in the care plan section) Acute Rehab PT Goals Patient Stated Goal: to go home PT Goal Formulation: With patient Time For Goal Achievement: 12/24/20 Potential to Achieve Goals: Good Progress towards PT goals: Progressing toward goals    Frequency    BID      PT Plan Current plan remains appropriate    Co-evaluation              AM-PAC PT "6 Clicks" Mobility   Outcome Measure  Help needed turning from your back to your side while in a flat bed without using bedrails?: A Little Help needed moving from lying on your back to sitting on the side of a flat bed without using bedrails?: A Little Help needed moving to and from a bed to a chair (including a wheelchair)?: A Little Help needed standing up from a chair using your arms (e.g., wheelchair or bedside chair)?: A Little Help needed to walk in hospital room?: A Little Help needed climbing 3-5 steps with a railing? : A Little 6 Click Score: 18    End of Session Equipment Utilized During Treatment: Gait belt Activity Tolerance: Patient tolerated treatment well Patient left: in chair;Other (comment) (B heels floating via rolled blanket; MD Hooten and pt's son present) Nurse Communication: Mobility status;Precautions PT Visit Diagnosis: Other abnormalities of gait and mobility (R26.89);Muscle weakness (generalized) (M62.81);Difficulty in walking, not elsewhere classified (R26.2);Pain Pain - Right/Left: Left Pain - part of body: Knee     Time: 1194-1740 PT Time Calculation (min) (ACUTE ONLY): 27 min  Charges:  $Gait Training: 8-22 mins $Therapeutic Activity: 8-22 mins                    Leitha Bleak, PT 12/10/20, 5:23 PM   Addendum:  Pt and pt's son  educated on appropriate gait belt use: both verbalizing understanding. Leitha Bleak, PT 12/10/20, 5:31 PM

## 2020-12-10 NOTE — H&P (Signed)
ORTHOPAEDIC HISTORY & PHYSICAL Regino Bellow, PA - 12/03/2020 2:15 PM EST Formatting of this note is different from the original. Images from the original note were not included. Chief Complaint Chief Complaint  Patient presents with  . Left Knee - Pain   Reason for Visit Laurie Roy is a 80 y.o. who presents today for history and physical. She is to undergo a left total knee arthroplasty on 12/10/2020. Patient is accompanied today with her son. Patient was last seen in the clinic on 09/13/2020. There have been no change in her condition since that time.  She reports a 2 year(s) history of bilateral knee pain with the left knee more symptomatic than the right. She had a remote left knee arthroscopy. She localizes most of the pain along the medial aspect of the knees. She reports no swelling, no locking, and some giving way of the knees. The pain is aggravated by any weight bearing. The patient has not appreciated any significant improvement despite activity modification and intraarticular corticosteroid injections. She is unable to tolerate NSAIDs. The knee pain limits her ability to ambulate long distances. She is not using any ambulatory aids. The patient states that the knee pain has progressed to the point that it is significantly interfering with her activities of daily living.   Past Medical History Past Medical History:  Diagnosis Date  . Anxiety  . Arthritis  Multiple sites. Eval'd by ortho in the past.  . Bigeminy  . Breast cancer (CMS-HCC) 2001  Lt infiltrating lobular carcinoma. s/p lumpectomy x2 then mastectomy. Followed in the past by [facility]  . Cataract cortical, senile  . Hyperlipidemia  . Hyperplastic colon polyp 2006  . Hypertension  . IGT (impaired glucose tolerance)  . Neuropathy  Presumed after bug/snake bite years ago.  . Obesity (BMI 30-39.9), unspecified  . Vitamin B12 deficiency 02/2017  . Vitamin D deficiency 02/2017   Past Surgical  History Past Surgical History:  Procedure Laterality Date  . ARTHROSCOPY KNEE Left 1997  Dr. Marry Guan  . CATARACT EXTRACTION Bilateral 2007  . CHOLECYSTECTOMY 2001  2/2 Gallstones  . COLONOSCOPY 11/07/2004  +Hyperplastic polyp/Repeat 49yrs  . HEAD & NECK SKIN LESION EXCISIONAL BIOPSY Left 2004  Supraclavicular, above mastectomy site.  Marland Kitchen HERNIA REPAIR  Umbilical.  . MASTECTOMY Left 02/2001  Due to breast ca, after 2 failed lumpectomies.  Marland Kitchen MASTECTOMY PARTIAL / LUMPECTOMY Left  x2 w/ failure requiring ultimate mastectomy.  . Right salpingo-oophorectomy   Past Family History Family History  Problem Relation Age of Onset  . High blood pressure (Hypertension) Father  . Colon cancer Paternal Uncle  . Colon cancer Cousin   Medications Current Outpatient Medications Ordered in Epic  Medication Sig Dispense Refill  . diltiazem (CARDIZEM CD) 240 MG CD capsule TAKE 1 CAPSULE EVERY DAY 90 capsule 1  . venlafaxine (EFFEXOR-XR) 75 MG XR capsule TAKE 1 CAPSULE BY MOUTH EVERY DAY 90 capsule 1  . gabapentin (NEURONTIN) 100 MG capsule Take 2 capsules (200 mg total) by mouth 2 (two) times daily (Patient not taking: Reported on 12/03/2020 ) 360 capsule 1   No current Epic-ordered facility-administered medications on file.   Allergies No Known Allergies   Review of Systems A comprehensive 14 point ROS was performed, reviewed, and the pertinent orthopaedic findings are documented in the HPI.  Exam BP (!) 142/82  Ht 162.6 cm (5\' 4" )  Wt 100.2 kg (221 lb)  BMI 37.93 kg/m   General: Well-developed well-nourished female seen in  no acute distress.   HEENT: Atraumatic,normocephalic. Pupils are equal and reactive to light. Oropharynx is clear with moist mucosa  Lungs: Clear to auscultation bilaterally   Cardiovascular: Regular rate and rhythm. Normal S1, S2. No murmurs. No appreciable gallops or rubs. Peripheral pulses are palpable.  Abdomen: Soft, non-tender, nondistended. Bowel sounds  present  Extremity:  Left Knee: Soft tissue swelling: none Effusion: none Erythema: none Crepitance: mild Tenderness: medial Alignment: relative varus Mediolateral laxity: medial pseudolaxity Posterior sag: negative Patellar tracking: Good tracking without evidence of subluxation or tilt Atrophy: No significant atrophy.  Quadriceps tone was fair to good. Range of motion: 0/0/124 degrees  Neurological:  The patient is alert and oriented Sensation to light touch appears to be intact and within normal limits Gross motor strength appeared to be equal to 5/5  Vascular :  Peripheral pulses felt to be palpable. Capillary refill appears to be intact and within normal limits  X-ray  Standing AP, lateral, and sunrise radiographs of the left knee that were obtained in the office on 09/13/2020 showed narrowing of the medial cartilage space with bone-on-bone articulation and associated varus alignment. Osteophyte formation is noted. Subchondral sclerosis is noted. A calcified loose body is noted in the suprapatellar region. No evidence of fracture or dislocation.   Impression  1. Degenerative arthrosis left knee  Plan   1. Patient's medication list was gone over on today's visit she is currently taking no anticoagulation medication. 2. Patient will return to clinic 2 weeks postop 3. Questions about surgery and postop were discussed 4. As of 12/04/2020 patient is currently taking gabapentin 200 mg twice a day. She has been seen by her primary care physician Dr. Posey Pronto  This note was generated in part with voice recognition software and I apologize for any typographical errors that were not detected and corrected   Watt Climes PA  Electronically signed by Regino Bellow, PA at 12/05/2020 9:53 AM EST

## 2020-12-10 NOTE — Discharge Instructions (Addendum)
Instructions after Total Knee Replacement   Laurie P. Holley Bouche., M.D.     Dept. of [facility]  [place], [place]  [address]  Phone: [phone]   Fax: [phone]    DIET:  Drink plenty of non-alcoholic fluids.  Resume your normal diet. Include foods high in fiber.  ACTIVITY:   You may use crutches or a walker with weight-bearing as tolerated, unless instructed otherwise.  You may be weaned off of the walker or crutches by your Physical Therapist.   Do NOT place pillows under the knee. Anything placed under the knee could limit your ability to straighten the knee.    Continue doing gentle exercises. Exercising will reduce the pain and swelling, increase motion, and prevent muscle weakness.    Please continue to use the TED compression stockings for 6 weeks. You may remove the stockings at night, but should reapply them in the morning.  Do not drive or operate any equipment until instructed.  WOUND CARE:   Continue to use the PolarCare or ice packs periodically to reduce pain and swelling.  You may bathe or shower after the staples are removed at the first office visit following surgery.  MEDICATIONS:  You may resume your regular medications.  Please take the pain medication as prescribed on the medication.  Do not take pain medication on an empty stomach.  You have been given a prescription for a blood thinner (Lovenox or Coumadin). Please take the medication as instructed. (NOTE: After completing a 2 week course of Lovenox, take one Enteric-coated aspirin once a day. This along with elevation will help reduce the possibility of phlebitis in your operated leg.)  Do not drive or drink alcoholic beverages when taking pain medications.  CALL THE OFFICE FOR:  Temperature above 101 degrees  Excessive bleeding or drainage on the dressing.  Excessive swelling, coldness, or paleness of the toes.  Persistent  nausea and vomiting.  FOLLOW-UP:   You should have an appointment to return to the office in 10-14 days after surgery.  Arrangements have been made for continuation of Physical Therapy (either home therapy or outpatient therapy).   [facility] Department Directory         [email or web address]       [email or web address]          Cardiology  Appointments: [place] [phone]  Endocrinology  Appointments: [place] [phone] [place] [phone]  Gastroenterology  Appointments: [place] [phone] [place] [phone]        General Surgery   Appointments: [facility]  Internal Medicine/Family Medicine  Appointments: [facility] [place] - [phone] [place] [phone]  Metabolic and [place] Loss Surgery  Appointments: [facility]        Neurology  Appointments: [place] [phone] [place] - [phone]  Neurosurgery  Appointments: [place]  Obstetrics & Gynecology  Appointments: [place] [phone] [place] - [phone]        Pediatrics  Appointments: [place] [phone] [place] - [phone]  Physiatry  Appointments: [place] [phone]  Physical Therapy  Appointments: [place] [phone]        Podiatry  Appointments: [place] [phone] [place] - [phone]  Pulmonology  Appointments: [place]  Rheumatology  Appointments: [place] [phone]        [place] Location: [facility]  [place]ie [place],   [id number]  [place] Location: [facility] [address]  [place], [place]  [address]  [place] Location: [facility] [place], [place]  [address]      Bupivacaine Liposomal Suspension for Injectionn  WEAR GREEN EXPAREL BRACELET X 3 DAYS What is this medicine? BUPIVACAINE LIPOSOMAL  (bue PIV a kane LIP oh som al) is an anesthetic. It causes loss of feeling in the skin or other tissues. It is used to prevent and to treat pain from some procedures. This medicine may be used for other purposes; ask your health care provider or pharmacist if you have questions. COMMON BRAND NAME(S): EXPAREL What should I tell my health care provider before I take this medicine? They need to know if you have any of these conditions:  G6PD deficiency  heart disease  kidney disease  liver disease  low blood pressure  lung or breathing disease, like asthma  an unusual or allergic reaction to bupivacaine, other medicines, foods, dyes, or preservatives  pregnant or trying to get pregnant  breast-feeding How should I use this medicine? This medicine is injected into the affected area. It is given by a health care provider in a hospital or clinic setting. Talk to your health care provider about the use of this medicine in children. While it may be given to children as young as 6 years for selected conditions, precautions do apply. Overdosage: If you think you have taken too much of this medicine contact a poison control center or emergency room at once. NOTE: This medicine is only for you. Do not share this medicine with others. What if I miss a dose? This does not apply. What may interact with this medicine? This medicine may interact with the following medications:  acetaminophen  certain antibiotics like dapsone, nitrofurantoin, aminosalicylic acid, sulfonamides  certain medicines for seizures like phenobarbital, phenytoin, valproic acid  chloroquine  cyclophosphamide  flutamide  hydroxyurea  ifosfamide  metoclopramide  nitric oxide  nitroglycerin  nitroprusside  nitrous oxide  other local anesthetics like lidocaine, pramoxine, tetracaine  primaquine  quinine  rasburicase  sulfasalazine This list may not describe all possible interactions. Give your  health care provider a list of all the medicines, herbs, non-prescription drugs, or dietary supplements you use. Also tell them if you smoke, drink alcohol, or use illegal drugs. Some items may interact with your medicine. What should I watch for while using this medicine? Your condition will be monitored carefully while you are receiving this medicine. Be careful to avoid injury while the area is numb, and you are not aware of pain. What side effects may I notice from receiving this medicine? Side effects that you should report to your doctor or health care professional as soon as possible:  allergic reactions like skin rash, itching or hives, swelling of the face, lips, or tongue  seizures  signs and symptoms of a dangerous change in heartbeat or heart rhythm like chest pain; dizziness; fast, irregular heartbeat; palpitations; feeling faint or lightheaded; falls; breathing problems  signs and symptoms of methemoglobinemia such as pale, gray, or blue colored skin; headache; fast heartbeat; shortness of breath; feeling faint or lightheaded, falls; tiredness Side effects that usually do not require medical attention (report to your doctor or health care professional if they continue or are bothersome):  anxious  back pain  changes in taste  changes in vision  constipation  dizziness  fever  nausea, vomiting This list may not describe all possible side effects. Call your doctor for medical advice about side effects. You may report side  effects to FDA at 1-800-FDA-1088. Where should I keep my medicine? This drug is given in a hospital or clinic and will not be stored at home. NOTE: This sheet is a summary. It may not cover all possible information. If you have questions about this medicine, talk to your doctor, pharmacist, or health care provider.  2021 Elsevier/Gold Standard (2020-01-26 12:24:57)   AMBULATORY SURGERY  DISCHARGE INSTRUCTIONS   1) The drugs that you were given  will stay in your system until tomorrow so for the next 24 hours you should not:  A) Drive an automobile B) Make any legal decisions C) Drink any alcoholic beverage   2) You may resume regular meals tomorrow.  Today it is better to start with liquids and gradually work up to solid foods.  You may eat anything you prefer, but it is better to start with liquids, then soup and crackers, and gradually work up to solid foods.   3) Please notify your doctor immediately if you have any unusual bleeding, trouble breathing, redness and pain at the surgery site, drainage, fever, or pain not relieved by medication.    4) Additional Instructions:        Please contact your physician with any problems or Same Day Surgery at [phone], Monday through Friday 6 am to 4 pm, or [place] at [facility] number at [phone].

## 2020-12-10 NOTE — Anesthesia Procedure Notes (Signed)
Procedure Name: Intubation Date/Time: 12/10/2020 7:50 AM Performed by: Lia Foyer, CRNA Pre-anesthesia Checklist: Patient identified, Emergency Drugs available, Suction available and Patient being monitored Patient Re-evaluated:Patient Re-evaluated prior to induction Oxygen Delivery Method: Circle system utilized Preoxygenation: Pre-oxygenation with 100% oxygen Induction Type: IV induction Ventilation: Mask ventilation without difficulty Laryngoscope Size: McGraph and 3 Grade View: Grade II Tube type: Oral Tube size: 7.0 mm Number of attempts: 1 Airway Equipment and Method: Stylet and Video-laryngoscopy Placement Confirmation: ETT inserted through vocal cords under direct vision,  positive ETCO2 and breath sounds checked- equal and bilateral Secured at: 19 cm Tube secured with: Tape Dental Injury: Teeth and Oropharynx as per pre-operative assessment  Difficulty Due To: Difficulty was anticipated and Difficult Airway- due to anterior larynx Future Recommendations: Recommend- induction with short-acting agent, and alternative techniques readily available

## 2020-12-10 NOTE — OR Nursing (Signed)
PT in for evaluation 1350.

## 2020-12-10 NOTE — OR Nursing (Signed)
Pt back in with patient.  Dr. Marry Guan and patient's son present for same.  Dr. Marry Guan advises at 4:52 pm, ok to discharge pt to home.  Gait belt given for home use.

## 2020-12-10 NOTE — H&P (Signed)
The patient has been re-examined, and the chart reviewed, and there have been no interval changes to the documented history and physical.    The risks, benefits, and alternatives have been discussed at length. The patient expressed understanding of the risks benefits and agreed with plans for surgical intervention.  Annaka Cleaver P. Amoy Steeves, Jr. M.D.    

## 2020-12-10 NOTE — OR Nursing (Signed)
Bedside commode delivered to patient in postop - pt advises she has rolling walker at home already.

## 2020-12-10 NOTE — Anesthesia Preprocedure Evaluation (Addendum)
Anesthesia Evaluation  Patient identified by MRN, date of birth, ID band Patient awake    Reviewed: Allergy & Precautions, H&P , NPO status , Patient's Chart, lab work & pertinent test results  History of Anesthesia Complications (+) PONV  Airway Mallampati: II  TM Distance: <3 FB Neck ROM: full    Dental  (+) Teeth Intact   Pulmonary sleep apnea (suspected) , neg COPD,    breath sounds clear to auscultation       Cardiovascular hypertension, (-) angina(-) Past MI and (-) Cardiac Stents (-) dysrhythmias  Rhythm:regular Rate:Normal     Neuro/Psych negative neurological ROS  negative psych ROS   GI/Hepatic negative GI ROS, Neg liver ROS,   Endo/Other  negative endocrine ROS  Renal/GU      Musculoskeletal   Abdominal   Peds  Hematology negative hematology ROS (+)   Anesthesia Other Findings Past Medical History: 2002: Breast cancer ([place]) No date: Complication of anesthesia No date: Heart murmur No date: Personal history of chemotherapy No date: Personal history of radiation therapy No date: PONV (postoperative nausea and vomiting)  Past Surgical History: No date: COLONOSCOPY 2002: MASTECTOMY; Left  BMI    Body Mass Index: 36.28 kg/m      Reproductive/Obstetrics negative OB ROS                            Anesthesia Physical Anesthesia Plan  ASA: II  Anesthesia Plan: Spinal   Post-op Pain Management:    Induction:   PONV Risk Score and Plan: Propofol infusion and Ondansetron  Airway Management Planned: Simple Face Mask  Additional Equipment:   Intra-op Plan:   Post-operative Plan:   Informed Consent: I have reviewed the patients History and Physical, chart, labs and discussed the procedure including the risks, benefits and alternatives for the proposed anesthesia with the patient or authorized representative who has indicated his/her understanding and acceptance.      Dental Advisory Given  Plan Discussed with: Anesthesiologist, CRNA and Surgeon  Anesthesia Plan Comments:         Anesthesia Quick Evaluation

## 2020-12-10 NOTE — Op Note (Signed)
OPERATIVE NOTE  DATE OF SURGERY:  12/10/2020  PATIENT NAME:  Laurie Roy   DOB: August 31, 1941  MRN: [id number]  PRE-OPERATIVE DIAGNOSIS: Degenerative arthrosis of the left knee, primary  POST-OPERATIVE DIAGNOSIS:  Same  PROCEDURE:  Left total knee arthroplasty using computer-assisted navigation  SURGEON:  Marciano Sequin. M.D.  ASSISTANT: Cassell Smiles, PA-C (present and scrubbed throughout the case, critical for assistance with exposure, retraction, instrumentation, and closure)  ANESTHESIA: general  ESTIMATED BLOOD LOSS: 50 mL  FLUIDS REPLACED: 1200 mL of crystalloid  TOURNIQUET TIME: 117 minutes  DRAINS: Wound VAC  SOFT TISSUE RELEASES: Anterior cruciate ligament, posterior cruciate ligament, deep medial collateral ligament, patellofemoral ligament  IMPLANTS UTILIZED: DePuy Attune size 5N posterior stabilized femoral component (cemented), size 4 rotating platform tibial component (cemented), 38 mm medialized dome patella (cemented), and a 12 mm stabilized rotating platform polyethylene insert.  INDICATIONS FOR SURGERY: Laurie Roy is a 80 y.o. year old female with a long history of progressive knee pain. X-rays demonstrated severe degenerative changes in tricompartmental fashion. The patient had not seen any significant improvement despite conservative nonsurgical intervention. After discussion of the risks and benefits of surgical intervention, the patient expressed understanding of the risks benefits and agree with plans for total knee arthroplasty.   The risks, benefits, and alternatives were discussed at length including but not limited to the risks of infection, bleeding, nerve injury, stiffness, blood clots, the need for revision surgery, cardiopulmonary complications, among others, and they were willing to proceed.  PROCEDURE IN DETAIL: The patient was brought into the operating room and, after adequate general anesthesia was achieved, a tourniquet was placed on the  patient's upper thigh. The patient's knee and leg were cleaned and prepped with alcohol and DuraPrep and draped in the usual sterile fashion. A "timeout" was performed as per usual protocol. The lower extremity was exsanguinated using an Esmarch, and the tourniquet was inflated to 300 mmHg. An anterior longitudinal incision was made followed by a standard mid vastus approach. The deep fibers of the medial collateral ligament were elevated in a subperiosteal fashion off of the medial flare of the tibia so as to maintain a continuous soft tissue sleeve. The patella was subluxed laterally and the patellofemoral ligament was incised.  A large osteochondral loose body was removed from the suprapatellar pouch.  Inspection of the knee demonstrated severe degenerative changes with full-thickness loss of articular cartilage. Osteophytes were debrided using a rongeur. Anterior and posterior cruciate ligaments were excised. Two 4.0 mm Schanz pins were inserted in the femur and into the tibia for attachment of the array of trackers used for computer-assisted navigation. Hip center was identified using a circumduction technique. Distal landmarks were mapped using the computer. The distal femur and proximal tibia were mapped using the computer. The distal femoral cutting guide was positioned using computer-assisted navigation so as to achieve a 5 distal valgus cut. The femur was sized and it was felt that a size 5N femoral component was appropriate. A size 5 femoral cutting guide was positioned and the anterior cut was performed and verified using the computer. This was followed by completion of the posterior and chamfer cuts. Femoral cutting guide for the central box was then positioned in the center box cut was performed.  Attention was then directed to the proximal tibia. Medial and lateral menisci were excised. The extramedullary tibial cutting guide was positioned using computer-assisted navigation so as to achieve a 0  varus-valgus alignment and 3 posterior slope. The cut  was performed and verified using the computer. The proximal tibia was sized and it was felt that a size 4 tibial tray was appropriate. Tibial and femoral trials were inserted followed by insertion of a 12 mm polyethylene insert. This allowed for excellent mediolateral soft tissue balancing both in flexion and in full extension. Finally, the patella was cut and prepared so as to accommodate a 38 mm medialized dome patella. A patella trial was placed and the knee was placed through a range of motion with excellent patellar tracking appreciated. The femoral trial was removed after debridement of posterior osteophytes. The central post-hole for the tibial component was reamed followed by insertion of a keel punch. Tibial trials were then removed. Cut surfaces of bone were irrigated with copious amounts of normal saline using pulsatile lavage and then suctioned dry. Polymethylmethacrylate cement with gentamicin was prepared in the usual fashion using a vacuum mixer. Cement was applied to the cut surface of the proximal tibia as well as along the undersurface of a size 4 rotating platform tibial component. Tibial component was positioned and impacted into place. Excess cement was removed using [employer] fast streamer. Cement was then applied to the cut surfaces of the femur as well as along the posterior flanges of the size 5N femoral component. The femoral component was positioned and impacted into place. Excess cement was removed using [employer] fast streamer. A 12 mm polyethylene trial was inserted and the knee was brought into full extension with steady axial compression applied. Finally, cement was applied to the backside of a 38 mm medialized dome patella and the patellar component was positioned and patellar clamp applied. Excess cement was removed using [employer] fast streamer. After adequate curing of the cement, the tourniquet was deflated after a total tourniquet time of 117  minutes. Hemostasis was achieved using electrocautery. The knee was irrigated with copious amounts of normal saline using pulsatile lavage followed by 500 ml of Surgiphor and then suctioned dry. 20 mL of 1.3% Exparel and 60 mL of 0.25% Marcaine in 40 mL of normal saline was injected along the posterior capsule, medial and lateral gutters, and along the arthrotomy site. A 12 mm stabilized rotating platform polyethylene insert was inserted and the knee was placed through a range of motion with excellent mediolateral soft tissue balancing appreciated and excellent patellar tracking noted. The medial parapatellar portion of the incision was reapproximated using interrupted sutures of #1 Vicryl. Subcutaneous tissue was approximated in layers using first #0 Vicryl followed #2-0 Vicryl. The skin was approximated with skin staples. A wound VAC and sterile dressing were applied.  The patient tolerated the procedure well and was transported to the recovery room in stable condition.    [name], M.D.

## 2020-12-10 NOTE — Transfer of Care (Signed)
Immediate Anesthesia Transfer of Care Note  Patient: Laurie Roy  Procedure(s) Performed: COMPUTER ASSISTED TOTAL KNEE ARTHROPLASTY (Left Knee) APPLICATION OF PREVENA  WOUND VAC (Left Knee)  Patient Location: PACU  Anesthesia Type:General  Level of Consciousness: awake  Airway & Oxygen Therapy: Patient connected to face mask oxygen  Post-op Assessment: Post -op Vital signs reviewed and stable  Post vital signs: stable  Last Vitals:  Vitals Value Taken Time  BP 143/71 12/10/20 1141  Temp 36.2 C 12/10/20 1141  Pulse 81 12/10/20 1145  Resp 22 12/10/20 1145  SpO2 94 % 12/10/20 1145  Vitals shown include unvalidated device data.  Last Pain:  Vitals:   12/10/20 1141  TempSrc:   PainSc: Asleep         Complications: No complications documented.

## 2020-12-11 ENCOUNTER — Encounter: Payer: Self-pay | Admitting: Orthopedic Surgery

## 2020-12-11 DIAGNOSIS — E669 Obesity, unspecified: Secondary | ICD-10-CM | POA: Diagnosis not present

## 2020-12-11 DIAGNOSIS — G629 Polyneuropathy, unspecified: Secondary | ICD-10-CM | POA: Diagnosis not present

## 2020-12-11 DIAGNOSIS — F419 Anxiety disorder, unspecified: Secondary | ICD-10-CM | POA: Diagnosis not present

## 2020-12-11 DIAGNOSIS — Z96652 Presence of left artificial knee joint: Secondary | ICD-10-CM | POA: Diagnosis not present

## 2020-12-11 DIAGNOSIS — Z853 Personal history of malignant neoplasm of breast: Secondary | ICD-10-CM | POA: Diagnosis not present

## 2020-12-11 DIAGNOSIS — Z86010 Personal history of colonic polyps: Secondary | ICD-10-CM | POA: Diagnosis not present

## 2020-12-11 DIAGNOSIS — E538 Deficiency of other specified B group vitamins: Secondary | ICD-10-CM | POA: Diagnosis not present

## 2020-12-11 DIAGNOSIS — Z471 Aftercare following joint replacement surgery: Secondary | ICD-10-CM | POA: Diagnosis not present

## 2020-12-11 DIAGNOSIS — Z6839 Body mass index (BMI) 39.0-39.9, adult: Secondary | ICD-10-CM | POA: Diagnosis not present

## 2020-12-11 DIAGNOSIS — I10 Essential (primary) hypertension: Secondary | ICD-10-CM | POA: Diagnosis not present

## 2020-12-11 DIAGNOSIS — E559 Vitamin D deficiency, unspecified: Secondary | ICD-10-CM | POA: Diagnosis not present

## 2020-12-11 NOTE — Anesthesia Postprocedure Evaluation (Signed)
Anesthesia Post Note  Patient: CORRIE REDER  Procedure(s) Performed: COMPUTER ASSISTED TOTAL KNEE ARTHROPLASTY (Left Knee) APPLICATION OF PREVENA  WOUND VAC (Left Knee)  Patient location during evaluation: PACU Anesthesia Type: General Level of consciousness: awake and alert Pain management: pain level controlled Vital Signs Assessment: post-procedure vital signs reviewed and stable Respiratory status: spontaneous breathing, nonlabored ventilation and respiratory function stable Cardiovascular status: blood pressure returned to baseline and stable Postop Assessment: no apparent nausea or vomiting Anesthetic complications: no   No complications documented.   Last Vitals:  Vitals:   12/10/20 1525 12/10/20 1642  BP: 129/73 (!) 148/73  Pulse: 73 75  Resp: 16 18  Temp:  36.4 C  SpO2: 96% 96%    Last Pain:  Vitals:   12/10/20 1642  TempSrc: Temporal  PainSc: 0-No pain                 Brett Canales Cyan Clippinger

## 2020-12-25 DIAGNOSIS — M6281 Muscle weakness (generalized): Secondary | ICD-10-CM | POA: Diagnosis not present

## 2020-12-25 DIAGNOSIS — M25662 Stiffness of left knee, not elsewhere classified: Secondary | ICD-10-CM | POA: Diagnosis not present

## 2020-12-25 DIAGNOSIS — G8929 Other chronic pain: Secondary | ICD-10-CM | POA: Diagnosis not present

## 2020-12-25 DIAGNOSIS — Z96652 Presence of left artificial knee joint: Secondary | ICD-10-CM | POA: Diagnosis not present

## 2020-12-25 DIAGNOSIS — M25562 Pain in left knee: Secondary | ICD-10-CM | POA: Diagnosis not present

## 2020-12-26 DIAGNOSIS — R0602 Shortness of breath: Secondary | ICD-10-CM | POA: Diagnosis not present

## 2020-12-26 DIAGNOSIS — J90 Pleural effusion, not elsewhere classified: Secondary | ICD-10-CM | POA: Diagnosis not present

## 2020-12-26 DIAGNOSIS — R600 Localized edema: Secondary | ICD-10-CM | POA: Diagnosis not present

## 2020-12-26 DIAGNOSIS — I517 Cardiomegaly: Secondary | ICD-10-CM | POA: Diagnosis not present

## 2020-12-28 DIAGNOSIS — M25562 Pain in left knee: Secondary | ICD-10-CM | POA: Diagnosis not present

## 2020-12-28 DIAGNOSIS — M6281 Muscle weakness (generalized): Secondary | ICD-10-CM | POA: Diagnosis not present

## 2020-12-28 DIAGNOSIS — Z96652 Presence of left artificial knee joint: Secondary | ICD-10-CM | POA: Diagnosis not present

## 2020-12-28 DIAGNOSIS — M25662 Stiffness of left knee, not elsewhere classified: Secondary | ICD-10-CM | POA: Diagnosis not present

## 2020-12-28 DIAGNOSIS — G8929 Other chronic pain: Secondary | ICD-10-CM | POA: Diagnosis not present

## 2020-12-31 DIAGNOSIS — M25562 Pain in left knee: Secondary | ICD-10-CM | POA: Diagnosis not present

## 2020-12-31 DIAGNOSIS — M25662 Stiffness of left knee, not elsewhere classified: Secondary | ICD-10-CM | POA: Diagnosis not present

## 2020-12-31 DIAGNOSIS — G8929 Other chronic pain: Secondary | ICD-10-CM | POA: Diagnosis not present

## 2020-12-31 DIAGNOSIS — Z96652 Presence of left artificial knee joint: Secondary | ICD-10-CM | POA: Diagnosis not present

## 2020-12-31 DIAGNOSIS — M6281 Muscle weakness (generalized): Secondary | ICD-10-CM | POA: Diagnosis not present

## 2021-01-01 DIAGNOSIS — Z471 Aftercare following joint replacement surgery: Secondary | ICD-10-CM | POA: Diagnosis not present

## 2021-01-04 DIAGNOSIS — M6281 Muscle weakness (generalized): Secondary | ICD-10-CM | POA: Diagnosis not present

## 2021-01-04 DIAGNOSIS — G8929 Other chronic pain: Secondary | ICD-10-CM | POA: Diagnosis not present

## 2021-01-04 DIAGNOSIS — Z96652 Presence of left artificial knee joint: Secondary | ICD-10-CM | POA: Diagnosis not present

## 2021-01-04 DIAGNOSIS — M25562 Pain in left knee: Secondary | ICD-10-CM | POA: Diagnosis not present

## 2021-01-04 DIAGNOSIS — M25662 Stiffness of left knee, not elsewhere classified: Secondary | ICD-10-CM | POA: Diagnosis not present

## 2021-01-07 DIAGNOSIS — G8929 Other chronic pain: Secondary | ICD-10-CM | POA: Diagnosis not present

## 2021-01-07 DIAGNOSIS — Z96652 Presence of left artificial knee joint: Secondary | ICD-10-CM | POA: Diagnosis not present

## 2021-01-07 DIAGNOSIS — M6281 Muscle weakness (generalized): Secondary | ICD-10-CM | POA: Diagnosis not present

## 2021-01-07 DIAGNOSIS — M25662 Stiffness of left knee, not elsewhere classified: Secondary | ICD-10-CM | POA: Diagnosis not present

## 2021-01-07 DIAGNOSIS — M25562 Pain in left knee: Secondary | ICD-10-CM | POA: Diagnosis not present

## 2021-01-10 DIAGNOSIS — M6281 Muscle weakness (generalized): Secondary | ICD-10-CM | POA: Diagnosis not present

## 2021-01-17 DIAGNOSIS — M25562 Pain in left knee: Secondary | ICD-10-CM | POA: Diagnosis not present

## 2021-01-17 DIAGNOSIS — G8929 Other chronic pain: Secondary | ICD-10-CM | POA: Diagnosis not present

## 2021-01-17 DIAGNOSIS — Z96652 Presence of left artificial knee joint: Secondary | ICD-10-CM | POA: Diagnosis not present

## 2021-01-17 DIAGNOSIS — M25662 Stiffness of left knee, not elsewhere classified: Secondary | ICD-10-CM | POA: Diagnosis not present

## 2021-01-17 DIAGNOSIS — M6281 Muscle weakness (generalized): Secondary | ICD-10-CM | POA: Diagnosis not present

## 2021-01-22 DIAGNOSIS — Z96652 Presence of left artificial knee joint: Secondary | ICD-10-CM | POA: Diagnosis not present

## 2021-02-01 DIAGNOSIS — M1990 Unspecified osteoarthritis, unspecified site: Secondary | ICD-10-CM | POA: Diagnosis not present

## 2021-02-01 DIAGNOSIS — R7302 Impaired glucose tolerance (oral): Secondary | ICD-10-CM | POA: Diagnosis not present

## 2021-02-01 DIAGNOSIS — Z79899 Other long term (current) drug therapy: Secondary | ICD-10-CM | POA: Diagnosis not present

## 2021-02-01 DIAGNOSIS — E559 Vitamin D deficiency, unspecified: Secondary | ICD-10-CM | POA: Diagnosis not present

## 2021-02-01 DIAGNOSIS — I519 Heart disease, unspecified: Secondary | ICD-10-CM | POA: Insufficient documentation

## 2021-02-01 DIAGNOSIS — E538 Deficiency of other specified B group vitamins: Secondary | ICD-10-CM | POA: Diagnosis not present

## 2021-02-01 DIAGNOSIS — I10 Essential (primary) hypertension: Secondary | ICD-10-CM | POA: Diagnosis not present

## 2021-02-01 DIAGNOSIS — E7800 Pure hypercholesterolemia, unspecified: Secondary | ICD-10-CM | POA: Diagnosis not present

## 2021-02-01 DIAGNOSIS — G629 Polyneuropathy, unspecified: Secondary | ICD-10-CM | POA: Diagnosis not present

## 2021-02-01 DIAGNOSIS — F419 Anxiety disorder, unspecified: Secondary | ICD-10-CM | POA: Diagnosis not present

## 2021-02-21 DIAGNOSIS — R0602 Shortness of breath: Secondary | ICD-10-CM | POA: Diagnosis not present

## 2021-02-21 DIAGNOSIS — R600 Localized edema: Secondary | ICD-10-CM | POA: Diagnosis not present

## 2021-02-22 DIAGNOSIS — R0602 Shortness of breath: Secondary | ICD-10-CM | POA: Diagnosis not present

## 2021-02-22 DIAGNOSIS — R600 Localized edema: Secondary | ICD-10-CM | POA: Diagnosis not present

## 2021-03-05 IMAGING — DX DG KNEE 1-2V PORT*L*
2 series · 2 of 2 positions shown · non-contrast
Comparison: None.

CLINICAL DATA: Status post total knee replacement

EXAM:
PORTABLE LEFT KNEE - 1-2 VIEW

[knee ap]
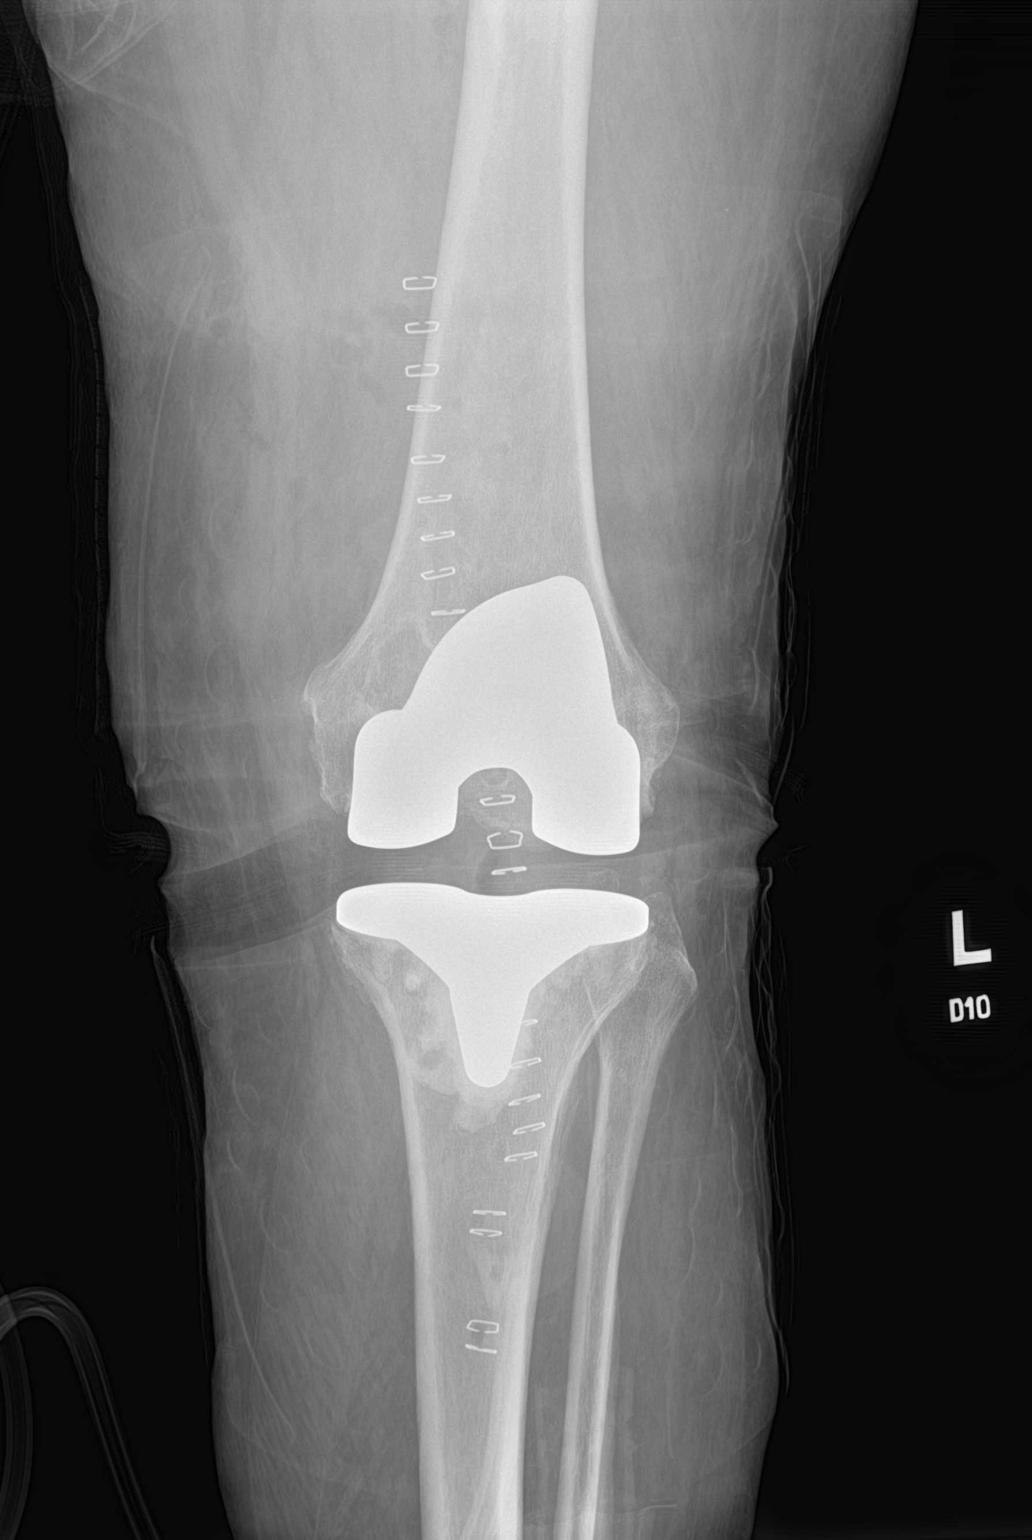

[knee lat]
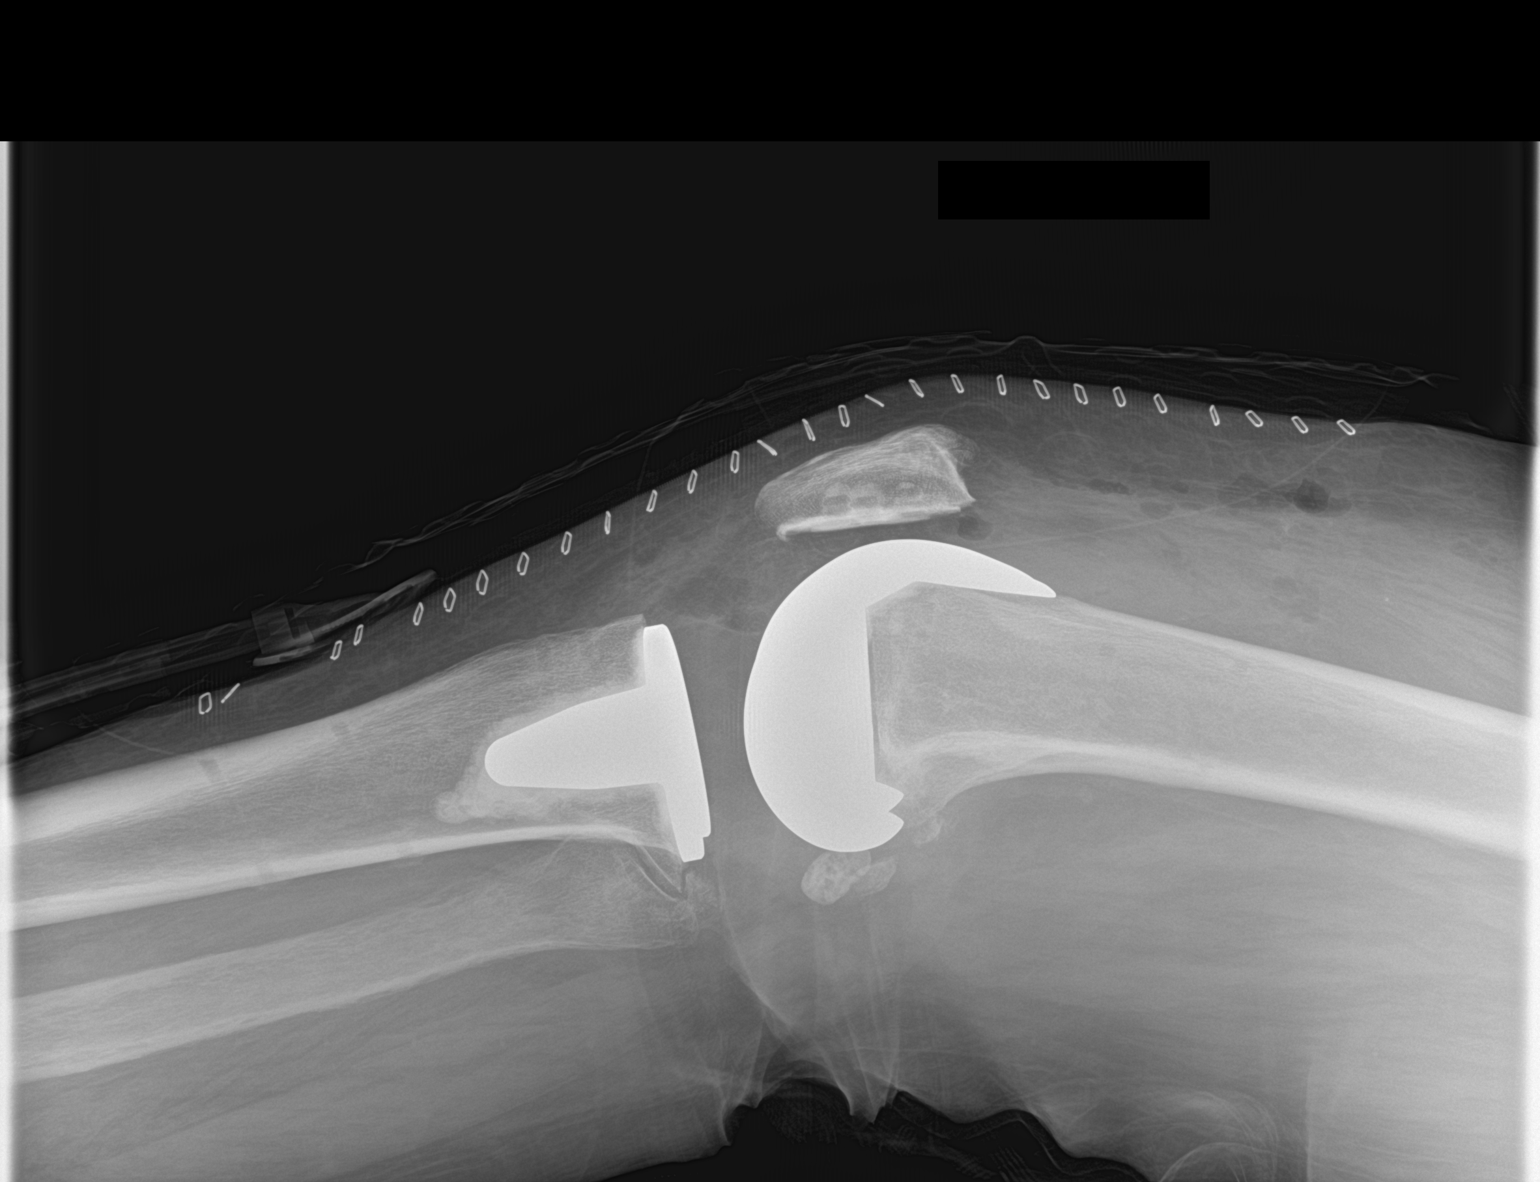

[2 of 2 positions shown; findings below may reference images not displayed]

FINDINGS: Frontal and lateral views obtained. Patient is status post total
knee replacement with femoral and tibial prosthetic components
well-seated. No fracture or dislocation. No erosion. There is air
within the knee joint as well as overlying skin staples.
IMPRESSION: Status post total knee replacement with prosthetic components
well-seated. No fracture or dislocation. Acute postoperative changes
noted.

## 2021-03-09 DIAGNOSIS — Z96652 Presence of left artificial knee joint: Secondary | ICD-10-CM | POA: Insufficient documentation

## 2021-03-13 DIAGNOSIS — I519 Heart disease, unspecified: Secondary | ICD-10-CM | POA: Diagnosis not present

## 2021-03-13 DIAGNOSIS — G4730 Sleep apnea, unspecified: Secondary | ICD-10-CM | POA: Diagnosis not present

## 2021-03-13 DIAGNOSIS — I10 Essential (primary) hypertension: Secondary | ICD-10-CM | POA: Diagnosis not present

## 2021-03-13 DIAGNOSIS — R9431 Abnormal electrocardiogram [ECG] [EKG]: Secondary | ICD-10-CM | POA: Insufficient documentation

## 2021-03-13 DIAGNOSIS — E7800 Pure hypercholesterolemia, unspecified: Secondary | ICD-10-CM | POA: Diagnosis not present

## 2021-03-13 DIAGNOSIS — E669 Obesity, unspecified: Secondary | ICD-10-CM | POA: Diagnosis not present

## 2021-04-05 DIAGNOSIS — R9431 Abnormal electrocardiogram [ECG] [EKG]: Secondary | ICD-10-CM | POA: Diagnosis not present

## 2021-04-05 DIAGNOSIS — I519 Heart disease, unspecified: Secondary | ICD-10-CM | POA: Diagnosis not present

## 2021-04-10 DIAGNOSIS — I519 Heart disease, unspecified: Secondary | ICD-10-CM | POA: Diagnosis not present

## 2021-04-10 DIAGNOSIS — Z01818 Encounter for other preprocedural examination: Secondary | ICD-10-CM | POA: Diagnosis not present

## 2021-04-10 DIAGNOSIS — E7800 Pure hypercholesterolemia, unspecified: Secondary | ICD-10-CM | POA: Diagnosis not present

## 2021-04-10 DIAGNOSIS — I10 Essential (primary) hypertension: Secondary | ICD-10-CM | POA: Diagnosis not present

## 2021-04-10 DIAGNOSIS — E669 Obesity, unspecified: Secondary | ICD-10-CM | POA: Diagnosis not present

## 2021-04-16 ENCOUNTER — Encounter: Payer: Self-pay | Admitting: Certified Registered"

## 2021-04-16 ENCOUNTER — Other Ambulatory Visit: Payer: Self-pay

## 2021-04-16 ENCOUNTER — Encounter: Payer: Self-pay | Admitting: Internal Medicine

## 2021-04-16 ENCOUNTER — Encounter
Admission: RE | Disposition: A | Discharge status: Home / Self Care | Payer: Self-pay | Source: Home / Self Care | Attending: Internal Medicine

## 2021-04-16 ENCOUNTER — Ambulatory Visit
Admission: RE | Admit: 2021-04-16 | Discharge: 2021-04-16 | Disposition: A | Discharge status: Home / Self Care | Payer: PPO | Attending: Internal Medicine | Admitting: Internal Medicine

## 2021-04-16 DIAGNOSIS — R9439 Abnormal result of other cardiovascular function study: Secondary | ICD-10-CM

## 2021-04-16 DIAGNOSIS — I209 Angina pectoris, unspecified: Secondary | ICD-10-CM | POA: Diagnosis not present

## 2021-04-16 DIAGNOSIS — I10 Essential (primary) hypertension: Secondary | ICD-10-CM | POA: Insufficient documentation

## 2021-04-16 DIAGNOSIS — I2510 Atherosclerotic heart disease of native coronary artery without angina pectoris: Secondary | ICD-10-CM | POA: Insufficient documentation

## 2021-04-16 DIAGNOSIS — R079 Chest pain, unspecified: Secondary | ICD-10-CM | POA: Diagnosis not present

## 2021-04-16 HISTORY — PX: LEFT HEART CATH AND CORONARY ANGIOGRAPHY: CATH118249

## 2021-04-16 SURGERY — LEFT HEART CATH AND CORONARY ANGIOGRAPHY
Anesthesia: Moderate Sedation | Laterality: Left

## 2021-04-16 MED ORDER — LIDOCAINE HCL (PF) 1 % IJ SOLN
INTRAMUSCULAR | Status: DC | PRN
  Administered 2021-04-16: 5 mL

## 2021-04-16 MED ORDER — SODIUM CHLORIDE 0.9 % WEIGHT BASED INFUSION
3.0000 mL/kg/h | INTRAVENOUS | Status: AC
  Administered 2021-04-16: 12:00:00 3 mL/kg/h via INTRAVENOUS

## 2021-04-16 MED ORDER — ASPIRIN 81 MG PO CHEW: 81.0000 mg | CHEWABLE_TABLET | ORAL | Status: DC

## 2021-04-16 MED ORDER — ACETAMINOPHEN 325 MG PO TABS: 650.0000 mg | ORAL_TABLET | ORAL | Status: DC | PRN

## 2021-04-16 MED ORDER — IOHEXOL 300 MG/ML  SOLN
INTRAMUSCULAR | Status: DC | PRN
  Administered 2021-04-16: 55 mL

## 2021-04-16 MED ORDER — LABETALOL HCL 5 MG/ML IV SOLN
INTRAVENOUS | Status: AC
  Administered 2021-04-16: 10 mg via INTRAVENOUS
  Filled 2021-04-16: qty 4

## 2021-04-16 MED ORDER — HEPARIN (PORCINE) IN NACL 2000-0.9 UNIT/L-% IV SOLN
INTRAVENOUS | Status: DC | PRN
  Administered 2021-04-16: 1000 mL

## 2021-04-16 MED ORDER — MIDAZOLAM HCL 2 MG/2ML IJ SOLN
INTRAMUSCULAR | Status: AC
  Filled 2021-04-16: qty 2

## 2021-04-16 MED ORDER — VERAPAMIL HCL 2.5 MG/ML IV SOLN
INTRAVENOUS | Status: DC | PRN
  Administered 2021-04-16: 2.5 mg via INTRAVENOUS

## 2021-04-16 MED ORDER — HEPARIN SODIUM (PORCINE) 1000 UNIT/ML IJ SOLN
INTRAMUSCULAR | Status: DC | PRN
  Administered 2021-04-16: 4700 [IU] via INTRAVENOUS

## 2021-04-16 MED ORDER — SODIUM CHLORIDE 0.9 % WEIGHT BASED INFUSION: 1.0000 mL/kg/h | INTRAVENOUS | Status: DC

## 2021-04-16 MED ORDER — HEPARIN (PORCINE) IN NACL 1000-0.9 UT/500ML-% IV SOLN
INTRAVENOUS | Status: AC
  Filled 2021-04-16: qty 1000

## 2021-04-16 MED ORDER — VERAPAMIL HCL 2.5 MG/ML IV SOLN
INTRAVENOUS | Status: AC
  Filled 2021-04-16: qty 2

## 2021-04-16 MED ORDER — SODIUM CHLORIDE 0.9 % IV SOLN: 250.0000 mL | INTRAVENOUS | Status: DC | PRN

## 2021-04-16 MED ORDER — LIDOCAINE HCL (PF) 1 % IJ SOLN
INTRAMUSCULAR | Status: AC
  Filled 2021-04-16: qty 30

## 2021-04-16 MED ORDER — HYDRALAZINE HCL 20 MG/ML IJ SOLN: 10.0000 mg | INTRAMUSCULAR | Status: DC | PRN

## 2021-04-16 MED ORDER — FENTANYL CITRATE (PF) 100 MCG/2ML IJ SOLN
INTRAMUSCULAR | Status: AC
  Filled 2021-04-16: qty 2

## 2021-04-16 MED ORDER — ASPIRIN 81 MG PO CHEW
CHEWABLE_TABLET | ORAL | Status: AC
  Administered 2021-04-16: 81 mg
  Filled 2021-04-16: qty 1

## 2021-04-16 MED ORDER — SODIUM CHLORIDE 0.9% FLUSH: 3.0000 mL | INTRAVENOUS | Status: DC | PRN

## 2021-04-16 MED ORDER — HEPARIN SODIUM (PORCINE) 1000 UNIT/ML IJ SOLN
INTRAMUSCULAR | Status: AC
  Filled 2021-04-16: qty 1

## 2021-04-16 MED ORDER — ONDANSETRON HCL 4 MG/2ML IJ SOLN: 4.0000 mg | Freq: Four times a day (QID) | INTRAMUSCULAR | Status: DC | PRN

## 2021-04-16 MED ORDER — LABETALOL HCL 5 MG/ML IV SOLN
10.0000 mg | INTRAVENOUS | Status: DC | PRN
  Administered 2021-04-16: 10 mg via INTRAVENOUS

## 2021-04-16 MED ORDER — FENTANYL CITRATE (PF) 100 MCG/2ML IJ SOLN
INTRAMUSCULAR | Status: DC | PRN
  Administered 2021-04-16: 50 ug via INTRAVENOUS

## 2021-04-16 MED ORDER — MIDAZOLAM HCL 2 MG/2ML IJ SOLN
INTRAMUSCULAR | Status: DC | PRN
  Administered 2021-04-16: 1 mg via INTRAVENOUS

## 2021-04-16 MED ORDER — SODIUM CHLORIDE 0.9% FLUSH: 3.0000 mL | Freq: Two times a day (BID) | INTRAVENOUS | Status: DC

## 2021-04-16 SURGICAL SUPPLY — 12 items
CATH INFINITI 5 FR JL3.5 (CATHETERS) ×3 IMPLANT
CATH INFINITI JR4 5F (CATHETERS) ×3 IMPLANT
DEVICE RAD TR BAND REGULAR (VASCULAR PRODUCTS) ×3 IMPLANT
DRAPE BRACHIAL (DRAPES) ×3 IMPLANT
GLIDESHEATH SLEND SS 6F .021 (SHEATH) ×3 IMPLANT
GUIDEWIRE INQWIRE 1.5J.035X260 (WIRE) ×1 IMPLANT
INQWIRE 1.5J .035X260CM (WIRE) ×3
PACK CARDIAC CATH (CUSTOM PROCEDURE TRAY) ×3 IMPLANT
PROTECTION STATION PRESSURIZED (MISCELLANEOUS) ×3
SET ATX SIMPLICITY (MISCELLANEOUS) ×3 IMPLANT
STATION PROTECTION PRESSURIZED (MISCELLANEOUS) ×1 IMPLANT
WIRE HITORQ VERSACORE ST 145CM (WIRE) ×3 IMPLANT

## 2021-04-16 NOTE — Progress Notes (Signed)
Labetolol admin. X 2 secondary to elevated BP greater than 347 Systolic.

## 2021-04-16 NOTE — Progress Notes (Signed)
Pt. Wrist swelled when TR band removed. Manual pressure held x 5 min. Tr band reapplied. Pt. Easily dyspneic with exertion. Spoke with pt. Son In [place], In via phone. Pt. Spoke with son . Pt. Calm, cooperative.

## 2021-04-17 ENCOUNTER — Encounter: Payer: Self-pay | Admitting: Internal Medicine

## 2021-05-10 DIAGNOSIS — I2510 Atherosclerotic heart disease of native coronary artery without angina pectoris: Secondary | ICD-10-CM | POA: Insufficient documentation

## 2021-05-10 DIAGNOSIS — E669 Obesity, unspecified: Secondary | ICD-10-CM | POA: Diagnosis not present

## 2021-05-10 DIAGNOSIS — I10 Essential (primary) hypertension: Secondary | ICD-10-CM | POA: Diagnosis not present

## 2021-05-10 DIAGNOSIS — E7800 Pure hypercholesterolemia, unspecified: Secondary | ICD-10-CM | POA: Diagnosis not present

## 2021-05-10 DIAGNOSIS — I519 Heart disease, unspecified: Secondary | ICD-10-CM | POA: Diagnosis not present

## 2021-05-20 ENCOUNTER — Other Ambulatory Visit: Payer: Self-pay | Admitting: Nurse Practitioner

## 2021-05-20 DIAGNOSIS — Z1231 Encounter for screening mammogram for malignant neoplasm of breast: Secondary | ICD-10-CM

## 2021-06-16 DIAGNOSIS — G4733 Obstructive sleep apnea (adult) (pediatric): Secondary | ICD-10-CM | POA: Diagnosis not present

## 2021-06-25 DIAGNOSIS — G4733 Obstructive sleep apnea (adult) (pediatric): Secondary | ICD-10-CM | POA: Diagnosis not present

## 2021-06-25 DIAGNOSIS — M2550 Pain in unspecified joint: Secondary | ICD-10-CM | POA: Diagnosis not present

## 2021-06-25 DIAGNOSIS — I2510 Atherosclerotic heart disease of native coronary artery without angina pectoris: Secondary | ICD-10-CM | POA: Diagnosis not present

## 2021-06-25 DIAGNOSIS — I519 Heart disease, unspecified: Secondary | ICD-10-CM | POA: Diagnosis not present

## 2021-06-25 DIAGNOSIS — I10 Essential (primary) hypertension: Secondary | ICD-10-CM | POA: Diagnosis not present

## 2021-06-25 DIAGNOSIS — E7800 Pure hypercholesterolemia, unspecified: Secondary | ICD-10-CM | POA: Diagnosis not present

## 2021-08-01 DIAGNOSIS — G4733 Obstructive sleep apnea (adult) (pediatric): Secondary | ICD-10-CM | POA: Diagnosis not present

## 2021-08-06 DIAGNOSIS — R7302 Impaired glucose tolerance (oral): Secondary | ICD-10-CM | POA: Diagnosis not present

## 2021-08-06 DIAGNOSIS — Z23 Encounter for immunization: Secondary | ICD-10-CM | POA: Diagnosis not present

## 2021-08-06 DIAGNOSIS — G629 Polyneuropathy, unspecified: Secondary | ICD-10-CM | POA: Diagnosis not present

## 2021-08-06 DIAGNOSIS — M2550 Pain in unspecified joint: Secondary | ICD-10-CM | POA: Diagnosis not present

## 2021-08-06 DIAGNOSIS — I5022 Chronic systolic (congestive) heart failure: Secondary | ICD-10-CM | POA: Diagnosis not present

## 2021-08-06 DIAGNOSIS — I10 Essential (primary) hypertension: Secondary | ICD-10-CM | POA: Diagnosis not present

## 2021-08-06 DIAGNOSIS — E538 Deficiency of other specified B group vitamins: Secondary | ICD-10-CM | POA: Diagnosis not present

## 2021-08-06 DIAGNOSIS — I2510 Atherosclerotic heart disease of native coronary artery without angina pectoris: Secondary | ICD-10-CM | POA: Diagnosis not present

## 2021-08-06 DIAGNOSIS — G4733 Obstructive sleep apnea (adult) (pediatric): Secondary | ICD-10-CM | POA: Diagnosis not present

## 2021-08-06 DIAGNOSIS — E669 Obesity, unspecified: Secondary | ICD-10-CM | POA: Diagnosis not present

## 2021-08-06 DIAGNOSIS — Z79899 Other long term (current) drug therapy: Secondary | ICD-10-CM | POA: Diagnosis not present

## 2021-08-06 DIAGNOSIS — Z0000 Encounter for general adult medical examination without abnormal findings: Secondary | ICD-10-CM | POA: Diagnosis not present

## 2021-08-06 DIAGNOSIS — E7800 Pure hypercholesterolemia, unspecified: Secondary | ICD-10-CM | POA: Diagnosis not present

## 2021-08-06 DIAGNOSIS — N289 Disorder of kidney and ureter, unspecified: Secondary | ICD-10-CM | POA: Diagnosis not present

## 2021-08-23 DIAGNOSIS — R0602 Shortness of breath: Secondary | ICD-10-CM | POA: Diagnosis not present

## 2021-08-23 DIAGNOSIS — I517 Cardiomegaly: Secondary | ICD-10-CM | POA: Diagnosis not present

## 2021-08-23 DIAGNOSIS — R9389 Abnormal findings on diagnostic imaging of other specified body structures: Secondary | ICD-10-CM | POA: Diagnosis not present

## 2021-08-23 DIAGNOSIS — I5032 Chronic diastolic (congestive) heart failure: Secondary | ICD-10-CM | POA: Diagnosis not present

## 2021-08-23 DIAGNOSIS — Z9989 Dependence on other enabling machines and devices: Secondary | ICD-10-CM | POA: Diagnosis not present

## 2021-08-23 DIAGNOSIS — G4733 Obstructive sleep apnea (adult) (pediatric): Secondary | ICD-10-CM | POA: Diagnosis not present

## 2021-08-26 ENCOUNTER — Other Ambulatory Visit: Payer: Self-pay | Admitting: Specialist

## 2021-08-26 ENCOUNTER — Other Ambulatory Visit (HOSPITAL_COMMUNITY): Payer: Self-pay | Admitting: Specialist

## 2021-08-26 DIAGNOSIS — I5022 Chronic systolic (congestive) heart failure: Secondary | ICD-10-CM | POA: Diagnosis not present

## 2021-08-26 DIAGNOSIS — R9389 Abnormal findings on diagnostic imaging of other specified body structures: Secondary | ICD-10-CM

## 2021-08-26 DIAGNOSIS — G4733 Obstructive sleep apnea (adult) (pediatric): Secondary | ICD-10-CM | POA: Diagnosis not present

## 2021-08-26 DIAGNOSIS — R0602 Shortness of breath: Secondary | ICD-10-CM

## 2021-08-26 DIAGNOSIS — E7800 Pure hypercholesterolemia, unspecified: Secondary | ICD-10-CM | POA: Diagnosis not present

## 2021-08-26 DIAGNOSIS — I2510 Atherosclerotic heart disease of native coronary artery without angina pectoris: Secondary | ICD-10-CM | POA: Diagnosis not present

## 2021-08-26 DIAGNOSIS — I10 Essential (primary) hypertension: Secondary | ICD-10-CM | POA: Diagnosis not present

## 2021-08-29 ENCOUNTER — Ambulatory Visit
Admission: RE | Admit: 2021-08-29 | Discharge: 2021-08-29 | Disposition: A | Discharge status: Home / Self Care | Payer: PPO | Source: Ambulatory Visit | Attending: Specialist | Admitting: Specialist

## 2021-08-29 ENCOUNTER — Other Ambulatory Visit: Payer: Self-pay

## 2021-08-29 DIAGNOSIS — I700 Atherosclerosis of aorta: Secondary | ICD-10-CM | POA: Diagnosis not present

## 2021-08-29 DIAGNOSIS — Q2549 Other congenital malformations of aorta: Secondary | ICD-10-CM

## 2021-08-29 DIAGNOSIS — R9389 Abnormal findings on diagnostic imaging of other specified body structures: Secondary | ICD-10-CM | POA: Insufficient documentation

## 2021-08-29 DIAGNOSIS — R0602 Shortness of breath: Secondary | ICD-10-CM | POA: Insufficient documentation

## 2021-08-29 DIAGNOSIS — J90 Pleural effusion, not elsewhere classified: Secondary | ICD-10-CM | POA: Diagnosis not present

## 2021-08-29 DIAGNOSIS — J9811 Atelectasis: Secondary | ICD-10-CM | POA: Diagnosis not present

## 2021-08-29 DIAGNOSIS — R0600 Dyspnea, unspecified: Secondary | ICD-10-CM | POA: Diagnosis not present

## 2021-08-29 HISTORY — DX: Other congenital malformations of aorta: Q25.49

## 2021-08-31 DIAGNOSIS — G4733 Obstructive sleep apnea (adult) (pediatric): Secondary | ICD-10-CM | POA: Diagnosis not present

## 2021-09-03 ENCOUNTER — Ambulatory Visit: Payer: PPO

## 2021-09-04 DIAGNOSIS — I5022 Chronic systolic (congestive) heart failure: Secondary | ICD-10-CM | POA: Diagnosis not present

## 2021-09-04 DIAGNOSIS — J90 Pleural effusion, not elsewhere classified: Secondary | ICD-10-CM | POA: Diagnosis not present

## 2021-09-04 DIAGNOSIS — R188 Other ascites: Secondary | ICD-10-CM | POA: Diagnosis not present

## 2021-09-04 DIAGNOSIS — R0609 Other forms of dyspnea: Secondary | ICD-10-CM | POA: Diagnosis not present

## 2021-09-11 ENCOUNTER — Other Ambulatory Visit
Admission: RE | Admit: 2021-09-11 | Discharge: 2021-09-11 | Disposition: A | Discharge status: Home / Self Care | Payer: PPO | Source: Ambulatory Visit | Attending: Specialist | Admitting: Specialist

## 2021-09-11 DIAGNOSIS — G4733 Obstructive sleep apnea (adult) (pediatric): Secondary | ICD-10-CM | POA: Diagnosis not present

## 2021-09-11 DIAGNOSIS — R600 Localized edema: Secondary | ICD-10-CM | POA: Diagnosis not present

## 2021-09-11 DIAGNOSIS — R0609 Other forms of dyspnea: Secondary | ICD-10-CM | POA: Insufficient documentation

## 2021-09-11 DIAGNOSIS — J90 Pleural effusion, not elsewhere classified: Secondary | ICD-10-CM | POA: Insufficient documentation

## 2021-09-11 LAB — BRAIN NATRIURETIC PEPTIDE: B Natriuretic Peptide: 1724 pg/mL — ABNORMAL HIGH (ref 0.0–100.0)

## 2021-10-01 DIAGNOSIS — G4733 Obstructive sleep apnea (adult) (pediatric): Secondary | ICD-10-CM | POA: Diagnosis not present

## 2021-10-24 DIAGNOSIS — I10 Essential (primary) hypertension: Secondary | ICD-10-CM | POA: Diagnosis not present

## 2021-10-24 DIAGNOSIS — I5022 Chronic systolic (congestive) heart failure: Secondary | ICD-10-CM | POA: Diagnosis not present

## 2021-10-24 DIAGNOSIS — I2510 Atherosclerotic heart disease of native coronary artery without angina pectoris: Secondary | ICD-10-CM | POA: Diagnosis not present

## 2021-10-24 DIAGNOSIS — G4733 Obstructive sleep apnea (adult) (pediatric): Secondary | ICD-10-CM | POA: Diagnosis not present

## 2021-10-31 DIAGNOSIS — I428 Other cardiomyopathies: Secondary | ICD-10-CM | POA: Diagnosis not present

## 2021-10-31 DIAGNOSIS — G4733 Obstructive sleep apnea (adult) (pediatric): Secondary | ICD-10-CM | POA: Diagnosis not present

## 2021-10-31 DIAGNOSIS — J811 Chronic pulmonary edema: Secondary | ICD-10-CM | POA: Diagnosis not present

## 2021-10-31 DIAGNOSIS — I517 Cardiomegaly: Secondary | ICD-10-CM | POA: Diagnosis not present

## 2021-10-31 DIAGNOSIS — R0609 Other forms of dyspnea: Secondary | ICD-10-CM | POA: Diagnosis not present

## 2021-10-31 DIAGNOSIS — Z01818 Encounter for other preprocedural examination: Secondary | ICD-10-CM | POA: Diagnosis not present

## 2021-10-31 DIAGNOSIS — R0602 Shortness of breath: Secondary | ICD-10-CM | POA: Diagnosis not present

## 2021-10-31 DIAGNOSIS — Z9989 Dependence on other enabling machines and devices: Secondary | ICD-10-CM | POA: Diagnosis not present

## 2021-11-22 IMAGING — CT CT CHEST W/O CM
2 of 4 series · 15 of 36 positions shown, 18 images · non-contrast
Comparison: None.

CLINICAL DATA: Dyspnea on exertion

EXAM:
CT CHEST WITHOUT CONTRAST
TECHNIQUE: Multidetector CT imaging of the chest was performed following the
standard protocol without IV contrast.

[Series 2: chest 2.00 · axial · 0.67mm/px · z∈[-1257,-973]mm · 12 of 168 slices shown, 15 images]
[im 13/168  mediastinal]
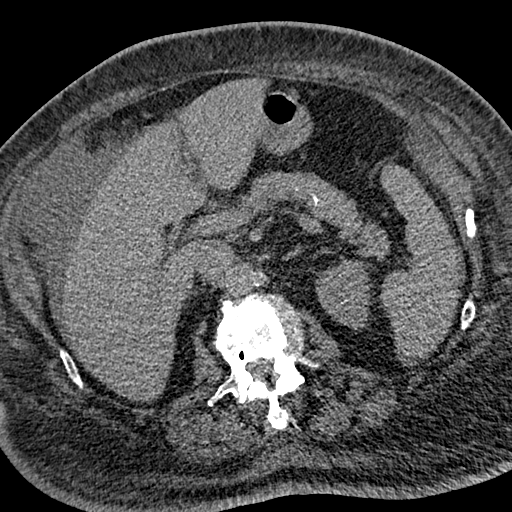
[im 13/168  lung]
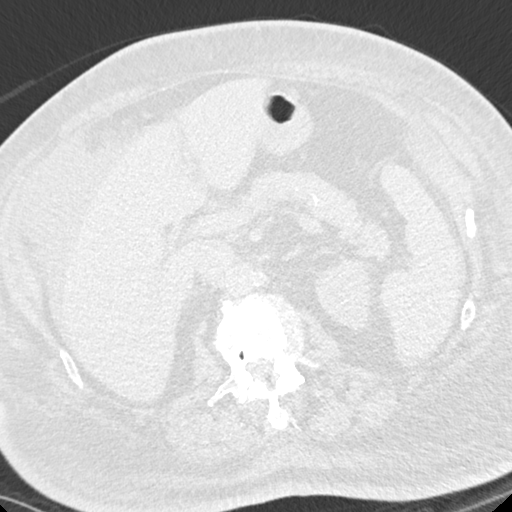
[im 26/168  lung]
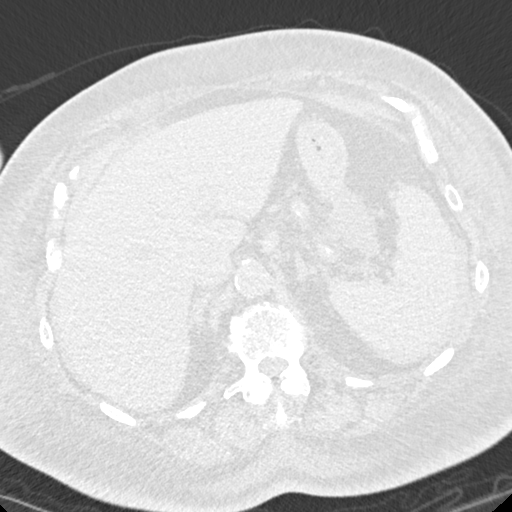
[im 39/168  lung]
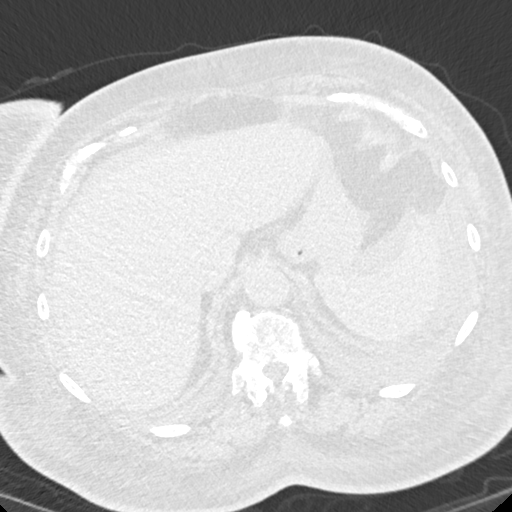
[im 52/168  lung]
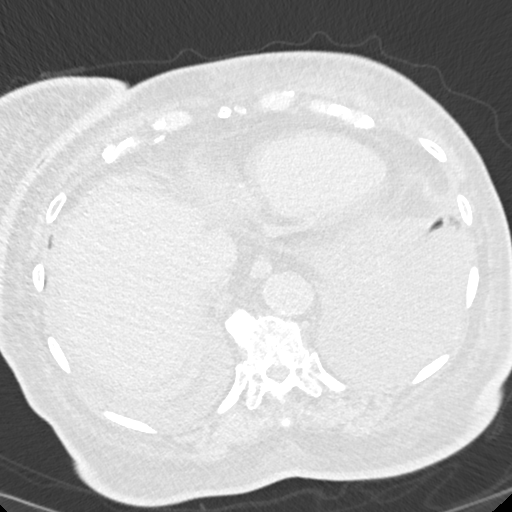
[im 65/168  mediastinal]
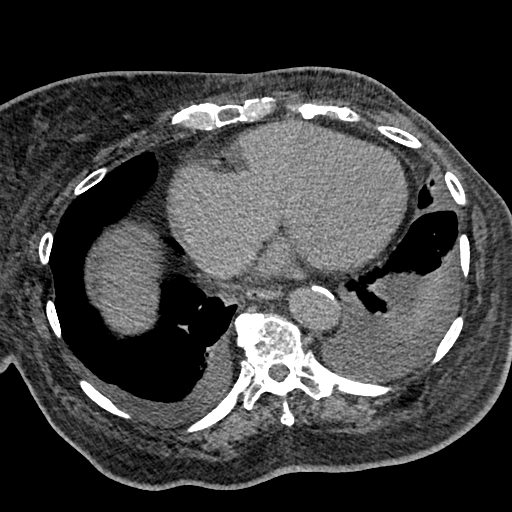
[im 65/168  lung]
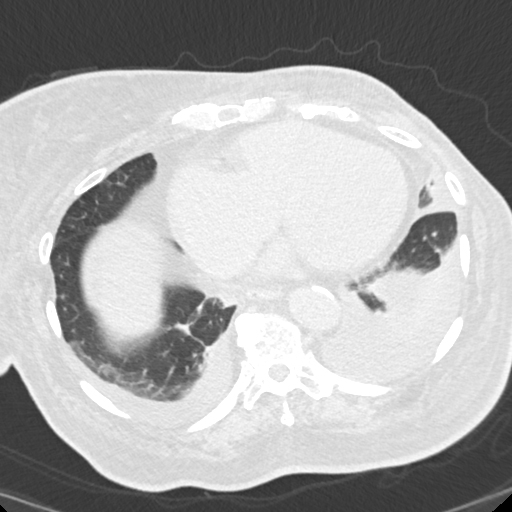
[im 78/168  lung]
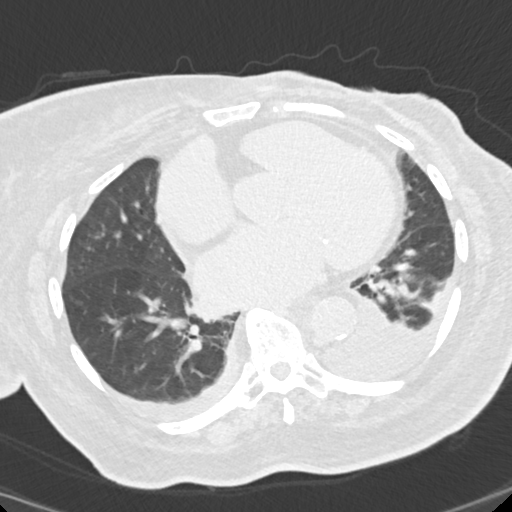
[im 90/168  lung]
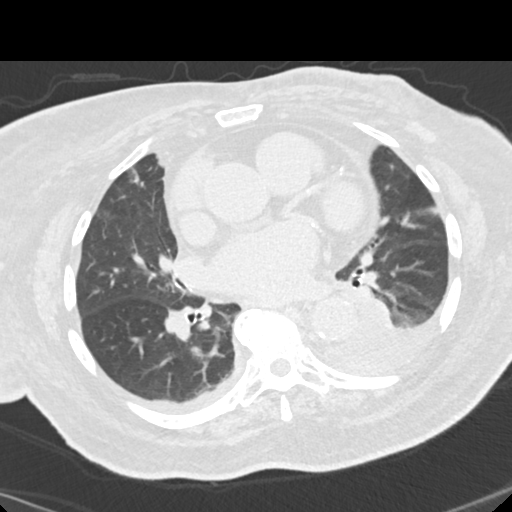
[im 103/168  lung]
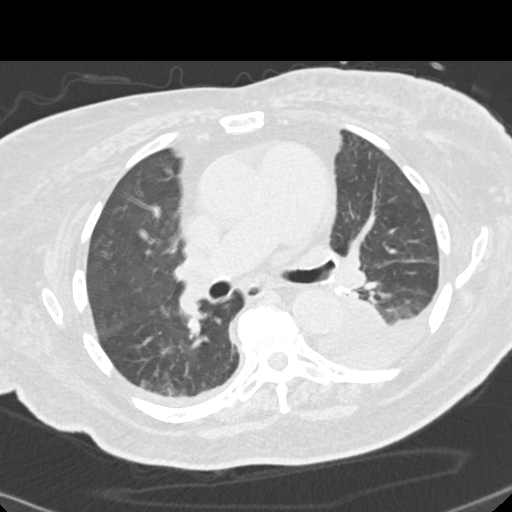
[im 116/168  mediastinal]
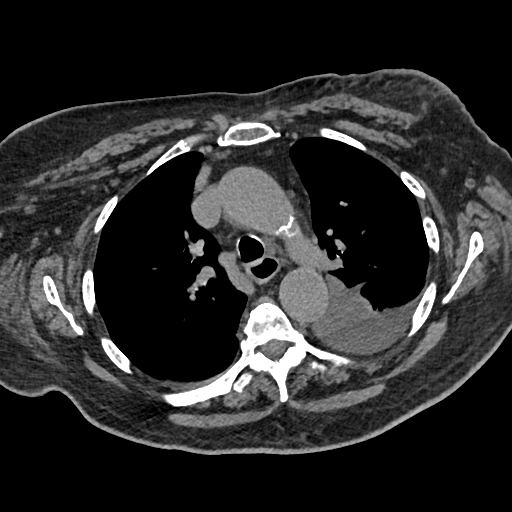
[im 116/168  lung]
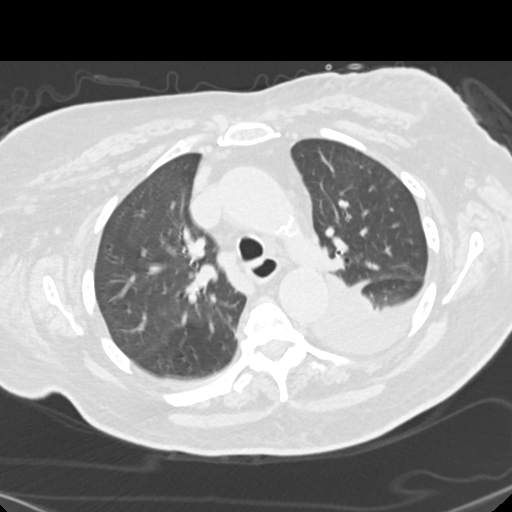
[im 129/168  lung]
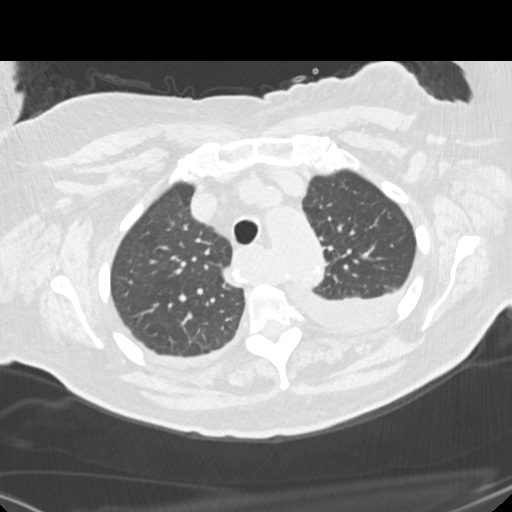
[im 142/168  lung]
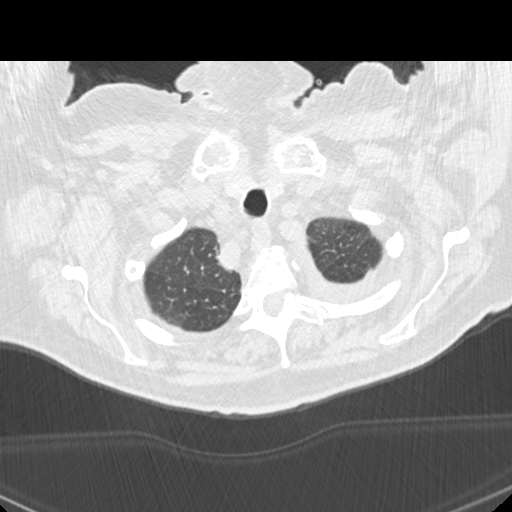
[im 155/168  lung]
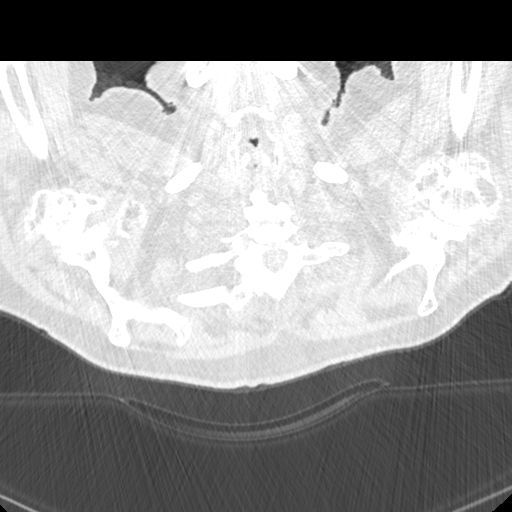

[Series 5: coronals chest 2.00 cor · coronal · 0.66mm/px · 3 of 148 slices shown]
[im 30/148  lung]
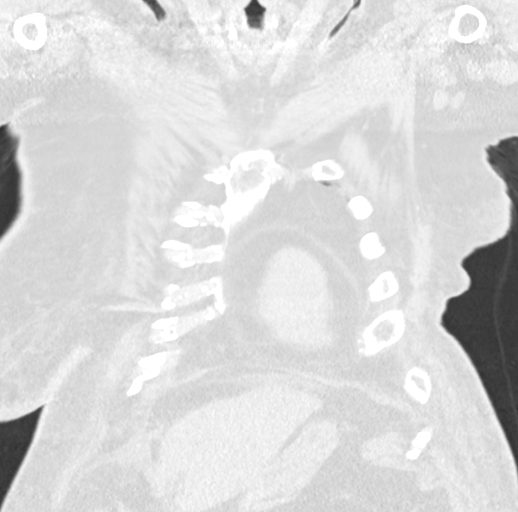
[im 59/148  lung]
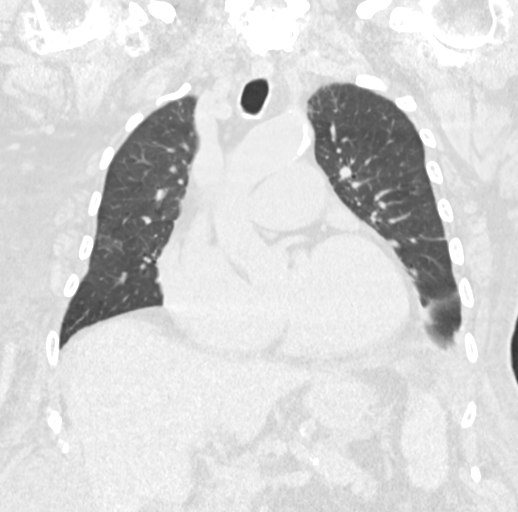
[im 89/148  lung]
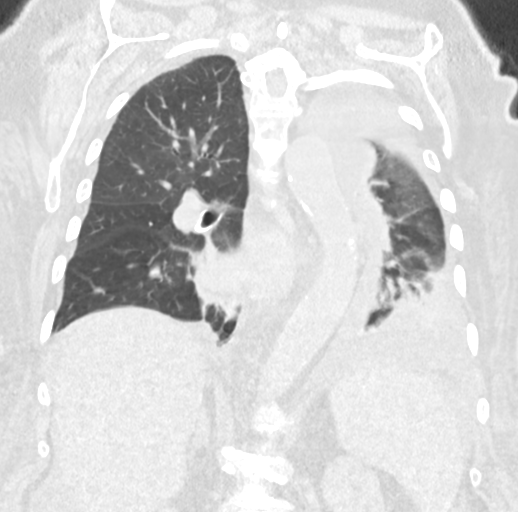

[15 of 36 positions shown; findings below may reference images not displayed]

FINDINGS: Cardiovascular: Mild multi-vessel coronary artery calcification.
Mild global cardiomegaly. No pericardial effusion. The central
pulmonary arteries are enlarged in keeping with changes of pulmonary
arterial hypertension. Moderate atherosclerotic calcification is
seen within the thoracic aorta. No aortic aneurysm. There is an
aberrant right subclavian artery noted with dilation of the origin
the right subclavian artery in keeping with a diverticulum of
K[place]rell. Diameter of this vessel measures 24 mm x 26 mm.

Mediastinum/Nodes: No enlarged mediastinal or axillary lymph nodes.
Thyroid gland, trachea, and esophagus demonstrate no significant
findings.

Lungs/Pleura: Small right and moderate left pleural effusions are
present with compressive atelectasis of the lung bases bilaterally,
left greater than right. There is superimposed patchy peribronchial
infiltrate within the aerated right lower lobe which may reflect a
superimposed infectious or inflammatory process. Rim calcified 4 mm
nodule within the left upper lobe is compatible with a benign
calcified granuloma. No pneumothorax. No central obstructing lesion.

Upper Abdomen: Ascites noted. Diffuse body wall subcutaneous edema
noted within the abdomen. Cholecystectomy has been performed.

Musculoskeletal: Degenerative changes are seen within the a thoracic
spine. No acute bone abnormality. No lytic or blastic bone lesions.
IMPRESSION: Mild coronary artery calcification. Mild global cardiomegaly.
Morphologic changes in keeping with pulmonary arterial hypertension.

Small bilateral pleural effusions, ascites, and diffuse body wall
subcutaneous edema possibly reflecting changes of cardiogenic
failure.

Mild patchy peribronchial infiltrate within the aerated right lower
lobe, possibly related to acute infection or aspiration.

Aberrant origin of the right subclavian artery with diverticulum of
[place] noted. Maximal dimension of the subclavian artery origin
of a 24 x 26 mm.

Aortic Atherosclerosis (T4TO3-CNT.T).

## 2021-11-25 DIAGNOSIS — I10 Essential (primary) hypertension: Secondary | ICD-10-CM | POA: Diagnosis not present

## 2021-11-25 DIAGNOSIS — I5022 Chronic systolic (congestive) heart failure: Secondary | ICD-10-CM | POA: Diagnosis not present

## 2021-11-25 DIAGNOSIS — I519 Heart disease, unspecified: Secondary | ICD-10-CM | POA: Diagnosis not present

## 2021-11-25 DIAGNOSIS — G4733 Obstructive sleep apnea (adult) (pediatric): Secondary | ICD-10-CM | POA: Diagnosis not present

## 2021-11-25 DIAGNOSIS — E7800 Pure hypercholesterolemia, unspecified: Secondary | ICD-10-CM | POA: Diagnosis not present

## 2021-11-25 DIAGNOSIS — I2510 Atherosclerotic heart disease of native coronary artery without angina pectoris: Secondary | ICD-10-CM | POA: Diagnosis not present

## 2021-12-11 DIAGNOSIS — Z96652 Presence of left artificial knee joint: Secondary | ICD-10-CM | POA: Diagnosis not present

## 2022-01-23 DIAGNOSIS — E7800 Pure hypercholesterolemia, unspecified: Secondary | ICD-10-CM | POA: Diagnosis not present

## 2022-01-23 DIAGNOSIS — I10 Essential (primary) hypertension: Secondary | ICD-10-CM | POA: Diagnosis not present

## 2022-01-23 DIAGNOSIS — I519 Heart disease, unspecified: Secondary | ICD-10-CM | POA: Diagnosis not present

## 2022-01-23 DIAGNOSIS — I2510 Atherosclerotic heart disease of native coronary artery without angina pectoris: Secondary | ICD-10-CM | POA: Diagnosis not present

## 2022-02-05 DIAGNOSIS — E7800 Pure hypercholesterolemia, unspecified: Secondary | ICD-10-CM | POA: Diagnosis not present

## 2022-02-05 DIAGNOSIS — M2550 Pain in unspecified joint: Secondary | ICD-10-CM | POA: Diagnosis not present

## 2022-02-05 DIAGNOSIS — N1831 Chronic kidney disease, stage 3a: Secondary | ICD-10-CM | POA: Diagnosis not present

## 2022-02-05 DIAGNOSIS — I5022 Chronic systolic (congestive) heart failure: Secondary | ICD-10-CM | POA: Diagnosis not present

## 2022-02-05 DIAGNOSIS — R7302 Impaired glucose tolerance (oral): Secondary | ICD-10-CM | POA: Diagnosis not present

## 2022-02-05 DIAGNOSIS — I10 Essential (primary) hypertension: Secondary | ICD-10-CM | POA: Diagnosis not present

## 2022-02-05 DIAGNOSIS — E538 Deficiency of other specified B group vitamins: Secondary | ICD-10-CM | POA: Diagnosis not present

## 2022-02-05 DIAGNOSIS — I2510 Atherosclerotic heart disease of native coronary artery without angina pectoris: Secondary | ICD-10-CM | POA: Diagnosis not present

## 2022-02-05 DIAGNOSIS — G4733 Obstructive sleep apnea (adult) (pediatric): Secondary | ICD-10-CM | POA: Diagnosis not present

## 2022-02-05 DIAGNOSIS — G629 Polyneuropathy, unspecified: Secondary | ICD-10-CM | POA: Diagnosis not present

## 2022-02-05 DIAGNOSIS — Z79899 Other long term (current) drug therapy: Secondary | ICD-10-CM | POA: Diagnosis not present

## 2022-02-10 DIAGNOSIS — I519 Heart disease, unspecified: Secondary | ICD-10-CM | POA: Diagnosis not present

## 2022-02-14 DIAGNOSIS — Z4432 Encounter for fitting and adjustment of external left breast prosthesis: Secondary | ICD-10-CM | POA: Diagnosis not present

## 2022-02-14 DIAGNOSIS — C50112 Malignant neoplasm of central portion of left female breast: Secondary | ICD-10-CM | POA: Diagnosis not present

## 2022-02-14 DIAGNOSIS — Z9012 Acquired absence of left breast and nipple: Secondary | ICD-10-CM | POA: Diagnosis not present

## 2022-02-17 DIAGNOSIS — E669 Obesity, unspecified: Secondary | ICD-10-CM | POA: Diagnosis not present

## 2022-02-17 DIAGNOSIS — I5022 Chronic systolic (congestive) heart failure: Secondary | ICD-10-CM | POA: Diagnosis not present

## 2022-02-17 DIAGNOSIS — I10 Essential (primary) hypertension: Secondary | ICD-10-CM | POA: Diagnosis not present

## 2022-02-17 DIAGNOSIS — E7800 Pure hypercholesterolemia, unspecified: Secondary | ICD-10-CM | POA: Diagnosis not present

## 2022-02-17 DIAGNOSIS — G4733 Obstructive sleep apnea (adult) (pediatric): Secondary | ICD-10-CM | POA: Diagnosis not present

## 2022-02-17 DIAGNOSIS — I2510 Atherosclerotic heart disease of native coronary artery without angina pectoris: Secondary | ICD-10-CM | POA: Diagnosis not present

## 2022-02-20 DIAGNOSIS — G4733 Obstructive sleep apnea (adult) (pediatric): Secondary | ICD-10-CM | POA: Diagnosis not present

## 2022-02-20 DIAGNOSIS — Z9989 Dependence on other enabling machines and devices: Secondary | ICD-10-CM | POA: Diagnosis not present

## 2022-02-20 DIAGNOSIS — E663 Overweight: Secondary | ICD-10-CM | POA: Diagnosis not present

## 2022-02-20 DIAGNOSIS — R0602 Shortness of breath: Secondary | ICD-10-CM | POA: Diagnosis not present

## 2022-03-07 DIAGNOSIS — Z4432 Encounter for fitting and adjustment of external left breast prosthesis: Secondary | ICD-10-CM | POA: Diagnosis not present

## 2022-03-07 DIAGNOSIS — C50112 Malignant neoplasm of central portion of left female breast: Secondary | ICD-10-CM | POA: Diagnosis not present

## 2022-03-07 DIAGNOSIS — Z9012 Acquired absence of left breast and nipple: Secondary | ICD-10-CM | POA: Diagnosis not present

## 2022-06-23 DIAGNOSIS — R0609 Other forms of dyspnea: Secondary | ICD-10-CM | POA: Diagnosis not present

## 2022-06-23 DIAGNOSIS — G4733 Obstructive sleep apnea (adult) (pediatric): Secondary | ICD-10-CM | POA: Diagnosis not present

## 2022-06-23 DIAGNOSIS — Z9989 Dependence on other enabling machines and devices: Secondary | ICD-10-CM | POA: Diagnosis not present

## 2022-06-23 DIAGNOSIS — R2689 Other abnormalities of gait and mobility: Secondary | ICD-10-CM | POA: Diagnosis not present

## 2022-07-01 DIAGNOSIS — G4733 Obstructive sleep apnea (adult) (pediatric): Secondary | ICD-10-CM | POA: Diagnosis not present

## 2022-08-01 DIAGNOSIS — G4733 Obstructive sleep apnea (adult) (pediatric): Secondary | ICD-10-CM | POA: Diagnosis not present

## 2022-08-03 DIAGNOSIS — R809 Proteinuria, unspecified: Secondary | ICD-10-CM | POA: Insufficient documentation

## 2022-08-12 DIAGNOSIS — N1831 Chronic kidney disease, stage 3a: Secondary | ICD-10-CM | POA: Diagnosis not present

## 2022-08-12 DIAGNOSIS — Z79899 Other long term (current) drug therapy: Secondary | ICD-10-CM | POA: Diagnosis not present

## 2022-08-12 DIAGNOSIS — I5022 Chronic systolic (congestive) heart failure: Secondary | ICD-10-CM | POA: Diagnosis not present

## 2022-08-12 DIAGNOSIS — M2550 Pain in unspecified joint: Secondary | ICD-10-CM | POA: Diagnosis not present

## 2022-08-12 DIAGNOSIS — E559 Vitamin D deficiency, unspecified: Secondary | ICD-10-CM | POA: Diagnosis not present

## 2022-08-12 DIAGNOSIS — Z0000 Encounter for general adult medical examination without abnormal findings: Secondary | ICD-10-CM | POA: Diagnosis not present

## 2022-08-12 DIAGNOSIS — I10 Essential (primary) hypertension: Secondary | ICD-10-CM | POA: Diagnosis not present

## 2022-08-12 DIAGNOSIS — E538 Deficiency of other specified B group vitamins: Secondary | ICD-10-CM | POA: Diagnosis not present

## 2022-08-12 DIAGNOSIS — R7302 Impaired glucose tolerance (oral): Secondary | ICD-10-CM | POA: Diagnosis not present

## 2022-08-12 DIAGNOSIS — E7800 Pure hypercholesterolemia, unspecified: Secondary | ICD-10-CM | POA: Diagnosis not present

## 2022-08-12 DIAGNOSIS — I2510 Atherosclerotic heart disease of native coronary artery without angina pectoris: Secondary | ICD-10-CM | POA: Diagnosis not present

## 2022-08-12 DIAGNOSIS — G629 Polyneuropathy, unspecified: Secondary | ICD-10-CM | POA: Diagnosis not present

## 2022-08-12 DIAGNOSIS — Z23 Encounter for immunization: Secondary | ICD-10-CM | POA: Diagnosis not present

## 2022-08-12 DIAGNOSIS — G4733 Obstructive sleep apnea (adult) (pediatric): Secondary | ICD-10-CM | POA: Diagnosis not present

## 2022-08-27 DIAGNOSIS — Z96652 Presence of left artificial knee joint: Secondary | ICD-10-CM | POA: Diagnosis not present

## 2022-08-27 DIAGNOSIS — M1711 Unilateral primary osteoarthritis, right knee: Secondary | ICD-10-CM | POA: Diagnosis not present

## 2022-08-27 DIAGNOSIS — E669 Obesity, unspecified: Secondary | ICD-10-CM | POA: Diagnosis not present

## 2022-08-31 DIAGNOSIS — G4733 Obstructive sleep apnea (adult) (pediatric): Secondary | ICD-10-CM | POA: Diagnosis not present

## 2022-09-01 DIAGNOSIS — G4733 Obstructive sleep apnea (adult) (pediatric): Secondary | ICD-10-CM | POA: Diagnosis not present

## 2022-10-01 DIAGNOSIS — G4733 Obstructive sleep apnea (adult) (pediatric): Secondary | ICD-10-CM | POA: Diagnosis not present

## 2022-10-06 ENCOUNTER — Ambulatory Visit: Payer: PPO | Attending: Nurse Practitioner | Admitting: Physical Therapy

## 2022-10-06 ENCOUNTER — Encounter: Payer: Self-pay | Admitting: Physical Therapy

## 2022-10-06 DIAGNOSIS — R262 Difficulty in walking, not elsewhere classified: Secondary | ICD-10-CM | POA: Diagnosis not present

## 2022-10-06 DIAGNOSIS — M6281 Muscle weakness (generalized): Secondary | ICD-10-CM | POA: Diagnosis not present

## 2022-10-06 DIAGNOSIS — R2689 Other abnormalities of gait and mobility: Secondary | ICD-10-CM | POA: Diagnosis not present

## 2022-10-06 NOTE — Therapy (Unsigned)
OUTPATIENT PHYSICAL THERAPY BALANCE EVALUATION   Patient Name: Laurie Roy MRN: [id number] DOB:04/14/1941, 81 y.o., female Today's Date: 10/06/2022   PT End of Session - 10/06/22 1554     Visit Number 1    Number of Visits 17    Date for PT Re-Evaluation 12/01/22    Authorization Type HTA, visit limit based on medical necessity; no auth    Progress Note Due on Visit 10    PT Start Time 1415    PT Stop Time 1502    PT Time Calculation (min) 47 min    Equipment Utilized During Treatment Gait belt    Activity Tolerance Patient tolerated treatment well    Behavior During Therapy WFL for tasks assessed/performed             Past Medical History:  Diagnosis Date   Breast cancer ([place]) 9833   Complication of anesthesia    Heart murmur    Personal history of chemotherapy    Personal history of radiation therapy    PONV (postoperative nausea and vomiting)    Past Surgical History:  Procedure Laterality Date   APPLICATION OF WOUND VAC Left 12/10/2020   Procedure: APPLICATION OF PREVENA  WOUND VAC;  Surgeon: Dereck Leep, MD;  Location: [facility];  Service: Orthopedics;  Laterality: Left;   COLONOSCOPY     KNEE ARTHROPLASTY Left 12/10/2020   Procedure: COMPUTER ASSISTED TOTAL KNEE ARTHROPLASTY;  Surgeon: Dereck Leep, MD;  Location: [facility];  Service: Orthopedics;  Laterality: Left;   LEFT HEART CATH AND CORONARY ANGIOGRAPHY Left 04/16/2021   Procedure: LEFT HEART CATH AND CORONARY ANGIOGRAPHY;  Surgeon: Corey Skains, MD;  Location: [facility];  Service: Cardiovascular;  Laterality: Left;   MASTECTOMY Left 2002   Patient Active Problem List   Diagnosis Date Noted   Angina pectoris ([place]) 04/16/2021   IGT (impaired glucose tolerance) 12/10/2020   Neuropathy 12/10/2020   Obesity (BMI 30-39.9) 12/10/2020   Primary osteoarthritis of left knee 09/16/2020   Primary osteoarthritis of right knee 09/16/2020   Polyarthralgia 03/26/2017   Vitamin B12 deficiency  02/01/2017   Vitamin D deficiency 02/01/2017   HTN (hypertension) 02/17/2014   Hypercholesterolemia 02/17/2014    PCP: Sallee Lange, NP  REFERRING PROVIDER: Sallee Lange, NP  REFERRING DIAGNOSIS:  G62.9 (ICD-10-CM) - Polyneuropathy, unspecified  R26.89 (ICD-10-CM) - Other abnormalities of gait and mobility    THERAPY DIAG: Imbalance  Difficulty in walking, not elsewhere classified  Muscle weakness (generalized)  ONSET DATE:  Februrary of 2022  FOLLOW UP APPT WITH PROVIDER: No    RATIONALE FOR EVALUATION AND TREATMENT: Rehabilitation  SUBJECTIVE:  Chief Complaint: Patient is an 81 year old female referred for balance and gait deficits with Hx of longstanding bilateral peripheral neuropathy, hx of L TKA and R knee pain secondary to OA.   Pertinent History Patient is an 81 year old female referred for balance and gait deficits with Hx of longstanding bilateral peripheral neuropathy, hx of L TKA (12/10/2020) and R knee pain secondary to OA. Patient reports SOB following panic attack during her L TKA rehab. Pt uses CPAP at night now for sleep apnea. She reports small heart blockage after that episode. She reports having increased difficulty walking during this workup with cardiology. Pt reports increased fluid accumulation from 230 lbs with her weight now being 179 lbs. Patient reports difficulty with walking at this time - she uses walker when going outside usually, especially on uneven ground. Pt finds herself walking without AD in home sometimes. Hx of vascular disease and peripheral neuropathy. Pt is notably challenged with fear of falling. Hx of dyspnea on exertion   Pain: Yes, R knee; doing better following cortisone injection  Numbness/Tingling: Yes, sensory change in hands and feet  from neuropathy Focal Weakness: general LE weakness Recent changes in overall health/medication: Yes, heart blockage found  Prior history of physical therapy for balance:  No Falls: Has patient fallen in last 6 months? Yes, Number of falls: 4, pt fell when riding lawn mower when ducking under limb (pt fell backward off of lawnmower). Pt fell in home 2x when bending over to grab item from floor, pt fell forward when getting out of bed.  Directional pattern for falls: Yes, forward falling when bending over  Imaging: Yes   Radiograph L knee 12/10/20 Status post total knee replacement with prosthetic components well-seated. No fracture or dislocation. Acute postoperative changes noted.    Prior level of function: Independent Occupational demands: Retired  Office manager: Media planner (jewelry, painting)   Red flags (bowel/bladder changes, saddle paresthesia, personal history of cancer, h/o spinal tumors, h/o compression fx, h/o abdominal aneurysm, abdominal pain, chills/fever, night sweats, nausea, vomiting, unrelenting pain): Positive for Hx of breast cancer (in remission), otherwise no other red flags  Precautions: None  Weight Bearing Restrictions: No  Living Environment Lives with: lives alone, daughter lives close by  Lives in: House/apartment Has following equipment at home: Single point cane, Environmental consultant - 4 wheeled, and Grab bars Pt uses step-to pattern to get up 14 steps in home 3 steps to get into home (gets up those easily)  Step-in shower, no shower bench; pt has suction grab bar   Patient Goals: Able to walk better, improve balance    OBJECTIVE:   Patient Surveys  FOTO: 8, predicted improvement to 16 ABC: to be obtained next visit   Cognition Patient is oriented to person, place, and time.  Recent memory is intact.  Remote memory is intact.  Attention span and concentration are intact.  Expressive speech is intact.  Patient's fund of knowledge is within normal limits for  educational level.     Gross Musculoskeletal Assessment Tremor: None Bulk: Normal Tone: Normal  Observation Venous distension and varicose veins in bilat legs, color change. No inc tissue temperature, calf girth, pitting edema, or significant calf tenderness.   GAIT: Distance walked: 45 ft Assistive device utilized: Single point cane, RUE Level of assistance: SBA Comments: Pt walks with RLE externally rotated, wide BOS, decreased gait cadence, diminished heel strike, slow velocity   Posture: Pt rests in posterior pelvic tilt. Grossly kyphotic posture. Pes planus. Genu valgus R>L knee.  LE MMT:  MMT (out of 5) Right 10/06/2022 Left 10/06/2022  Hip flexion 4 4  Hip extension    Hip abduction 5 5  Hip adduction 5 5  Hip internal rotation    Hip external rotation    Knee flexion 5 5  Knee extension 5 5  Ankle dorsiflexion 4 5  Ankle plantarflexion    Ankle inversion    Ankle eversion    (* = pain; Blank rows = not tested)   Sensation Decreased light touch sensation in bilateral toes and dorsum of feet   Reflexes Deferred   Cranial Nerves Visual acuity and visual fields are intact  Extraocular muscles are intact; pt does have difficulty tracking vertically from top of visual field back to neutral position  Facial sensation is intact bilaterally  Facial strength is intact bilaterally  Hearing is normal as tested by gross conversation Palate elevates midline, normal phonation  Shoulder shrug strength is intact  Tongue protrudes midline   Coordination/Cerebellar Finger to Nose: WNL Heel to Shin: WNL Rapid alternating movements: WNL Finger Opposition: WNL Pronator Drift: Negative   FUNCTIONAL OUTCOME MEASURES   Results Comments  BERG 10/06/22: Next visit/56 Fall risk, in need of intervention  TUG 10/06/22: 19.5 seconds   5TSTS 10/06/22: 17 seconds   DGI Next visit    10 Meter Gait Speed Next visit Self-selected: s = m/s; Fastest: s = m/s Below  normative values for full community ambulation  (Blank rows = not tested)    TODAY'S TREATMENT    Therapeutic Exercise - discussion on appropriate exercise/activity modification, PT education   Reviewed safe exercises for home. Discussed with pt plan to formally initiate HEP with [facility] program next visit. Discussed signs of DVT that would warrant immediate medical attention.   Patient education on current condition, prognosis, plan of care. Reviewed fall risk education pamphlet and gave to pt.      PATIENT EDUCATION:  Education details: see above for patient education details Person educated: Patient Education method: Explanation, Demonstration, and Handouts Education comprehension: verbalized understanding   HOME EXERCISE PROGRAM: Formal HEP to be initiated next visit     ASSESSMENT:  CLINICAL IMPRESSION: Patient is a 81 y.o. female who was seen today for physical therapy evaluation and treatment for balance and gait deficits. Patient has good gross LE strength, but she has difficulty with sit to stand performance and has notable gait changes contributing to increased risk of falling including diminished heel strike and RLE remaining externally rotated. Pt tends to fall forward when bending over or reaching to the ground to retrieve item. Objective impairments include Abnormal gait, cardiopulmonary status limiting activity, decreased activity tolerance, decreased balance, difficulty walking, decreased strength, and postural dysfunction. These impairments are limiting patient from cleaning, shopping, and community activity. Personal factors including Age, Past/current experiences, Time since onset of injury/illness/exacerbation, and 3+ comorbidities: (HTN, angina, hypercholesterolemia, polyarthralgia, R knee OA, peripheral neuropathy)  are also affecting patient's functional outcome. Patient will benefit from skilled PT to address above impairments and improve overall  function.  REHAB POTENTIAL: Good  CLINICAL DECISION MAKING: Evolving/moderate complexity  EVALUATION COMPLEXITY: Moderate   GOALS:  SHORT TERM GOALS: Target date: 10/30/2022  Pt will be independent with HEP in order to improve strength and balance in order to decrease fall risk and improve function at home. Baseline: 10/06/22: Baseline HEP to be initiated next visit  Goal status: INITIAL   LONG TERM GOALS: Target date: 12/04/2022  Pt will increase FOTO to at least 54  to demonstrate significant improvement in function at home related to balance  Baseline: 10/06/22: 57 Goal status: INITIAL  2.  Pt will improve BERG by at least 3 points in order to demonstrate clinically significant improvement in balance.   Baseline: 10/06/22: Will complete next visit Goal status: INITIAL  3.  Pt will improve ABC by at least 13% in order to demonstrate clinically significant improvement in balance confidence.      Baseline: 10/06/22: Will complete next visit Goal status: INITIAL  4. Pt will decrease 5TSTS by at least 3 seconds in order to demonstrate clinically significant improvement in LE strength      Baseline: 10/06/22: 17 seconds Goal status: INITIAL  5. Pt will improve DGI by at least 3 points in order to demonstrate clinically significant improvement in balance and decreased risk for falls.     Baseline: 10/06/22: Baseline to be completed next visit Goal status: INITIAL  6. Pt will decrease TUG to below 14 seconds/decrease in order to demonstrate decreased fall risk.  Baseline: 10/06/22: 19.5 sec Goal status: INITIAL     PLAN: PT FREQUENCY: 2x/week  PT DURATION: 8-10 weeks  PLANNED INTERVENTIONS: Therapeutic exercises, Therapeutic activity, Neuromuscular re-education, Balance training, Gait training, Patient/Family education,   PLAN FOR NEXT SESSION: Complete BERG, DGI, ABC Scale, 10-meter gait speed. Continue with task practice for stepping with sound heel strike technique,  postural control training, obstacle negotiation; strategies to improve ability to reach low to floor including squatting and functional reaching.    Valentina Gu, PT, DPT #[id number]  Eilleen Kempf 10/06/2022, 3:55 PM

## 2022-10-08 ENCOUNTER — Ambulatory Visit: Payer: PPO | Admitting: Physical Therapy

## 2022-10-08 DIAGNOSIS — R262 Difficulty in walking, not elsewhere classified: Secondary | ICD-10-CM

## 2022-10-08 DIAGNOSIS — E038 Other specified hypothyroidism: Secondary | ICD-10-CM | POA: Diagnosis not present

## 2022-10-08 DIAGNOSIS — M6281 Muscle weakness (generalized): Secondary | ICD-10-CM

## 2022-10-08 DIAGNOSIS — R2689 Other abnormalities of gait and mobility: Secondary | ICD-10-CM

## 2022-10-08 NOTE — Therapy (Signed)
OUTPATIENT PHYSICAL THERAPY TREATMENT NOTE   Patient Name: Laurie Roy MRN: [id number] DOB:11/11/1940, 81 y.o., female Today's Date: 10/08/2022  PCP: Sallee Lange, NP REFERRING PROVIDER: Sallee Lange, NP  END OF SESSION:   PT End of Session - 10/09/22 1635     Visit Number 2    Number of Visits 17    Date for PT Re-Evaluation 12/01/22    Authorization Type HTA, visit limit based on medical necessity; no auth    Progress Note Due on Visit 10    PT Start Time 1417    PT Stop Time 1500    PT Time Calculation (min) 43 min    Equipment Utilized During Treatment Gait belt    Activity Tolerance Patient tolerated treatment well    Behavior During Therapy WFL for tasks assessed/performed             Past Medical History:  Diagnosis Date   Breast cancer ([place]) 2023   Complication of anesthesia    Heart murmur    Personal history of chemotherapy    Personal history of radiation therapy    PONV (postoperative nausea and vomiting)    Past Surgical History:  Procedure Laterality Date   APPLICATION OF WOUND VAC Left 12/10/2020   Procedure: APPLICATION OF PREVENA  WOUND VAC;  Surgeon: Dereck Leep, MD;  Location: [facility];  Service: Orthopedics;  Laterality: Left;   COLONOSCOPY     KNEE ARTHROPLASTY Left 12/10/2020   Procedure: COMPUTER ASSISTED TOTAL KNEE ARTHROPLASTY;  Surgeon: Dereck Leep, MD;  Location: [facility];  Service: Orthopedics;  Laterality: Left;   LEFT HEART CATH AND CORONARY ANGIOGRAPHY Left 04/16/2021   Procedure: LEFT HEART CATH AND CORONARY ANGIOGRAPHY;  Surgeon: Corey Skains, MD;  Location: [facility];  Service: Cardiovascular;  Laterality: Left;   MASTECTOMY Left 2002   Patient Active Problem List   Diagnosis Date Noted   Angina pectoris ([place]) 04/16/2021   IGT (impaired glucose tolerance) 12/10/2020   Neuropathy 12/10/2020   Obesity (BMI 30-39.9) 12/10/2020   Primary osteoarthritis of left knee 09/16/2020   Primary  osteoarthritis of right knee 09/16/2020   Polyarthralgia 03/26/2017   Vitamin B12 deficiency 02/01/2017   Vitamin D deficiency 02/01/2017   HTN (hypertension) 02/17/2014   Hypercholesterolemia 02/17/2014    REFERRING DIAG:  G62.9 (ICD-10-CM) - Polyneuropathy, unspecified  R26.89 (ICD-10-CM) - Other abnormalities of gait and mobility    THERAPY DIAG:  Imbalance  Difficulty in walking, not elsewhere classified  Muscle weakness (generalized)  Rationale for Evaluation and Treatment Rehabilitation  PERTINENT HISTORY: Patient is an 81 year old female referred for balance and gait deficits with Hx of longstanding bilateral peripheral neuropathy, hx of L TKA (12/10/2020) and R knee pain secondary to OA. Patient reports SOB following panic attack during her L TKA rehab. Pt uses CPAP at night now for sleep apnea. She reports small heart blockage after that episode. She reports having increased difficulty walking during this workup with cardiology. Pt reports increased fluid accumulation from 230 lbs with her weight now being 179 lbs. Patient reports difficulty with walking at this time - she uses walker when going outside usually, especially on uneven ground. Pt finds herself walking without AD in home sometimes. Hx of vascular disease and peripheral neuropathy. Pt is notably challenged with fear of falling. Hx of dyspnea on exertion    Pain: Yes, R knee; doing better following cortisone injection  Numbness/Tingling: Yes, sensory change in hands and feet from neuropathy  Focal Weakness: general LE weakness Recent changes in overall health/medication: Yes, heart blockage found  Prior history of physical therapy for balance:  No Falls: Has patient fallen in last 6 months? Yes, Number of falls: 4, pt fell when riding lawn mower when ducking under limb (pt fell backward off of lawnmower). Pt fell in home 2x when bending over to grab item from floor, pt fell forward when getting out of bed.  Directional  pattern for falls: Yes, forward falling when bending over  Imaging: Yes    Radiograph L knee 12/10/20 Status post total knee replacement with prosthetic components well-seated. No fracture or dislocation. Acute postoperative changes noted.       Prior level of function: Independent Occupational demands: Retired  Office manager: Media planner (jewelry, painting)    Red flags (bowel/bladder changes, saddle paresthesia, personal history of cancer, h/o spinal tumors, h/o compression fx, h/o abdominal aneurysm, abdominal pain, chills/fever, night sweats, nausea, vomiting, unrelenting pain): Positive for Hx of breast cancer (in remission), otherwise no other red flags    Weight Bearing Restrictions: No   Living Environment Lives with: lives alone, daughter lives close by  Lives in: House/apartment Has following equipment at home: Single point cane, Environmental consultant - 4 wheeled, and Grab bars Pt uses step-to pattern to get up 14 steps in home 3 steps to get into home (gets up those easily)  Step-in shower, no shower bench; pt has suction grab bar     Patient Goals: Able to walk better, improve balance    PRECAUTIONS: Fall history   SUBJECTIVE:                                                                                                                                                                                      SUBJECTIVE STATEMENT:  Patient reports no major updates since IE. No recent near-falls.   PAIN:  Are you having pain? No   OBJECTIVE: (objective measures completed at initial evaluation unless otherwise dated)   Patient Surveys  FOTO: 90, predicted improvement to 69 ABC: 62.5%     Cognition Patient is oriented to person, place, and time.  Recent memory is intact.  Remote memory is intact.  Attention span and concentration are intact.  Expressive speech is intact.  Patient's fund of knowledge is within normal limits for educational level.                             Observation Venous distension and varicose veins in bilat legs, color change. No inc tissue temperature, calf girth, pitting edema, or significant calf tenderness.     GAIT: Distance walked:  45 ft Assistive device utilized: Single point cane, RUE Level of assistance: SBA Comments: Pt walks with RLE externally rotated, wide BOS, decreased gait cadence, diminished heel strike, slow velocity    Posture: Pt rests in posterior pelvic tilt. Grossly kyphotic posture. Pes planus. Genu valgus R>L knee.      LE MMT:   MMT (out of 5) Right 10/06/2022 Left 10/06/2022  Hip flexion 4 4  Hip extension      Hip abduction 5 5  Hip adduction 5 5  Hip internal rotation      Hip external rotation      Knee flexion 5 5  Knee extension 5 5  Ankle dorsiflexion 4 5  Ankle plantarflexion      Ankle inversion      Ankle eversion      (* = pain; Blank rows = not tested)     Sensation Decreased light touch sensation in bilateral toes and dorsum of feet     Reflexes Deferred     Cranial Nerves Visual acuity and visual fields are intact  Extraocular muscles are intact; pt does have difficulty tracking vertically from top of visual field back to neutral position  Facial sensation is intact bilaterally  Facial strength is intact bilaterally  Hearing is normal as tested by gross conversation Palate elevates midline, normal phonation  Shoulder shrug strength is intact  Tongue protrudes midline     Coordination/Cerebellar Finger to Nose: WNL Heel to Shin: WNL Rapid alternating movements: WNL Finger Opposition: WNL Pronator Drift: Negative     FUNCTIONAL OUTCOME MEASURES     Results Comments  BERG 10/08/22: 48/56 Fall risk, in need of intervention  TUG 10/06/22: 19.5 seconds    5TSTS 10/06/22: 17 seconds    DGI 10/08/22: 11/24    10 Meter Gait Speed 10/08/22: Self-selected: s = 0.59 m/s; Fastest: s = 0.69 m/s Below normative values for full community ambulation  (Blank rows = not  tested)       TODAY'S TREATMENT      Neuromuscular Re-education - for improved sensory integration, static and dynamic postural control, equilibrium and non-equilibrium coordination as needed for negotiating home and community environment and stepping over obstacles  Performance of BERG, DGI   -Discussed results of these outcome measures and association with fall risk and goals of PT to attain measurable improvement in these validated measures    Therapeutic Exercise - improved strength as needed to improve performance of CKC activities/functional movements and as needed for power production to prevent fall during episode of large postural perturbation, completion of performance-based outcome measures to detect fall risk and track progress    Performance of 10-meter gait speed and completion of ABC Scale  HEP review: Standing heel raise/toe raise; x10 alternating Standing march with light touch in single bar, in // bars; x10 alternating Sit to stand, reviewed   PATIENT EDUCATION: Creation of [employer] handout for initial HEP and review of simple, safe at-home exercises.     PATIENT EDUCATION:  Education details: see above for patient education details Person educated: Patient Education method: Explanation, Demonstration, and Handouts Education comprehension: verbalized understanding     HOME EXERCISE PROGRAM: Access Code: [email or web address] URL: [email or web address] Date: 10/12/2022 Prepared by: Valentina Gu  Exercises - Sit to Stand Without Arm Support  - 2 x daily - 7 x weekly - 2 sets - 10 reps - Heel Toe Raises with Unilateral Counter Support  - 2 x daily - 7 x weekly - 2 sets - 10  reps - Standing March with Counter Support  - 2 x daily - 7 x weekly - 2 sets - 10 reps      ASSESSMENT:   CLINICAL IMPRESSION: Today's session focused on completion of outcome measures with various balance and performance-based challenges to determine current fall risk and for  tracking progress with future visits. Patient is above cut-off score for BERG, but she does have room for clinically significant improvement in BERG. She is below cut-off score for falls risk for DGI. Patient is most notably challenged with obstacle clearance and standing with modified BOS (e.g. unipedal stance, Tandem standing). Patient has remaining deficits in impaired postural control, gait changes, hip and trunk control needed to prevent forward LOB with bending over/reaching to low-lying item.  Patient will benefit from skilled PT to address above impairments and improve overall function.   REHAB POTENTIAL: Good   CLINICAL DECISION MAKING: Evolving/moderate complexity   EVALUATION COMPLEXITY: Moderate     GOALS:   SHORT TERM GOALS: Target date: 10/30/2022   Pt will be independent with HEP in order to improve strength and balance in order to decrease fall risk and improve function at home. Baseline: 10/06/22: Baseline HEP to be initiated next visit  Goal status: INITIAL     LONG TERM GOALS: Target date: 12/04/2022   Pt will increase FOTO to at least 54 to demonstrate significant improvement in function at home related to balance  Baseline: 10/06/22: 57 Goal status: INITIAL   2.  Pt will improve BERG by at least 3 points in order to demonstrate clinically significant improvement in balance.   Baseline: 10/06/22: Will complete next visit     10/08/22: 48/56 Goal status: INITIAL   3.  Pt will improve ABC by at least 13% in order to demonstrate clinically significant improvement in balance confidence.      Baseline: 10/06/22: Will complete next visit     10/08/22: 62.5% Goal status: INITIAL   4. Pt will decrease 5TSTS by at least 3 seconds in order to demonstrate clinically significant improvement in LE strength      Baseline: 10/06/22: 17 seconds Goal status: INITIAL   5. Pt will improve DGI by at least 3 points in order to demonstrate clinically significant improvement in balance and  decreased risk for falls.     Baseline: 10/06/22: Baseline to be completed next visit     10/08/22: 11/24 Goal status: INITIAL   6. Pt will decrease TUG to below 14 seconds/decrease in order to demonstrate decreased fall risk.  Baseline: 10/06/22: 19.5 sec Goal status: INITIAL         PLAN: PT FREQUENCY: 2x/week   PT DURATION: 8-10 weeks   PLANNED INTERVENTIONS: Therapeutic exercises, Therapeutic activity, Neuromuscular re-education, Balance training, Gait training, Patient/Family education,    PLAN FOR NEXT SESSION: Continue with task practice for stepping with sound heel strike technique, postural control training, obstacle negotiation; strategies to improve ability to reach low to floor including squatting and functional reaching.    Valentina Gu, PT, DPT #[id number]  Eilleen Kempf, PT 10/09/2022, 4:36 PM

## 2022-10-13 ENCOUNTER — Encounter: Payer: Self-pay | Admitting: Physical Therapy

## 2022-10-13 ENCOUNTER — Ambulatory Visit: Payer: PPO | Admitting: Physical Therapy

## 2022-10-13 DIAGNOSIS — R2689 Other abnormalities of gait and mobility: Secondary | ICD-10-CM | POA: Diagnosis not present

## 2022-10-13 DIAGNOSIS — R262 Difficulty in walking, not elsewhere classified: Secondary | ICD-10-CM

## 2022-10-13 DIAGNOSIS — M6281 Muscle weakness (generalized): Secondary | ICD-10-CM

## 2022-10-13 NOTE — Therapy (Signed)
OUTPATIENT PHYSICAL THERAPY TREATMENT NOTE   Patient Name: Laurie Roy MRN: [id number] DOB:09/09/1941, 81 y.o., female Today's Date: 10/08/2022  PCP: Sallee Lange, NP REFERRING PROVIDER: Sallee Lange, NP  END OF SESSION:   PT End of Session - 10/13/22 1423     Visit Number 3    Number of Visits 17    Date for PT Re-Evaluation 12/01/22    Authorization Type HTA, visit limit based on medical necessity; no auth    Progress Note Due on Visit 10    PT Start Time 1419    PT Stop Time 1502    PT Time Calculation (min) 43 min    Equipment Utilized During Treatment Gait belt    Activity Tolerance Patient tolerated treatment well    Behavior During Therapy WFL for tasks assessed/performed              Past Medical History:  Diagnosis Date   Breast cancer ([place]) 9357   Complication of anesthesia    Heart murmur    Personal history of chemotherapy    Personal history of radiation therapy    PONV (postoperative nausea and vomiting)    Past Surgical History:  Procedure Laterality Date   APPLICATION OF WOUND VAC Left 12/10/2020   Procedure: APPLICATION OF PREVENA  WOUND VAC;  Surgeon: Dereck Leep, MD;  Location: [facility];  Service: Orthopedics;  Laterality: Left;   COLONOSCOPY     KNEE ARTHROPLASTY Left 12/10/2020   Procedure: COMPUTER ASSISTED TOTAL KNEE ARTHROPLASTY;  Surgeon: Dereck Leep, MD;  Location: [facility];  Service: Orthopedics;  Laterality: Left;   LEFT HEART CATH AND CORONARY ANGIOGRAPHY Left 04/16/2021   Procedure: LEFT HEART CATH AND CORONARY ANGIOGRAPHY;  Surgeon: Corey Skains, MD;  Location: [place] [facility];  Service: Cardiovascular;  Laterality: Left;   MASTECTOMY Left 2002   Patient Active Problem List   Diagnosis Date Noted   Angina pectoris ([place]) 04/16/2021   IGT (impaired glucose tolerance) 12/10/2020   Neuropathy 12/10/2020   Obesity (BMI 30-39.9) 12/10/2020   Primary osteoarthritis of left knee 09/16/2020   Primary  osteoarthritis of right knee 09/16/2020   Polyarthralgia 03/26/2017   Vitamin B12 deficiency 02/01/2017   Vitamin D deficiency 02/01/2017   HTN (hypertension) 02/17/2014   Hypercholesterolemia 02/17/2014    REFERRING DIAG:  G62.9 (ICD-10-CM) - Polyneuropathy, unspecified  R26.89 (ICD-10-CM) - Other abnormalities of gait and mobility    THERAPY DIAG:  Imbalance  Difficulty in walking, not elsewhere classified  Muscle weakness (generalized)  Rationale for Evaluation and Treatment Rehabilitation  PERTINENT HISTORY: Patient is an 81 year old female referred for balance and gait deficits with Hx of longstanding bilateral peripheral neuropathy, hx of L TKA (12/10/2020) and R knee pain secondary to OA. Patient reports SOB following panic attack during her L TKA rehab. Pt uses CPAP at night now for sleep apnea. She reports small heart blockage after that episode. She reports having increased difficulty walking during this workup with cardiology. Pt reports increased fluid accumulation from 230 lbs with her weight now being 179 lbs. Patient reports difficulty with walking at this time - she uses walker when going outside usually, especially on uneven ground. Pt finds herself walking without AD in home sometimes. Hx of vascular disease and peripheral neuropathy. Pt is notably challenged with fear of falling. Hx of dyspnea on exertion    Pain: Yes, R knee; doing better following cortisone injection  Numbness/Tingling: Yes, sensory change in hands and feet from  neuropathy Focal Weakness: general LE weakness Recent changes in overall health/medication: Yes, heart blockage found  Prior history of physical therapy for balance:  No Falls: Has patient fallen in last 6 months? Yes, Number of falls: 4, pt fell when riding lawn mower when ducking under limb (pt fell backward off of lawnmower). Pt fell in home 2x when bending over to grab item from floor, pt fell forward when getting out of bed.  Directional  pattern for falls: Yes, forward falling when bending over  Imaging: Yes    Radiograph L knee 12/10/20 Status post total knee replacement with prosthetic components well-seated. No fracture or dislocation. Acute postoperative changes noted.       Prior level of function: Independent Occupational demands: Retired  Office manager: Media planner (jewelry, painting)    Red flags (bowel/bladder changes, saddle paresthesia, personal history of cancer, h/o spinal tumors, h/o compression fx, h/o abdominal aneurysm, abdominal pain, chills/fever, night sweats, nausea, vomiting, unrelenting pain): Positive for Hx of breast cancer (in remission), otherwise no other red flags    Weight Bearing Restrictions: No   Living Environment Lives with: lives alone, daughter lives close by  Lives in: House/apartment Has following equipment at home: Single point cane, Environmental consultant - 4 wheeled, and Grab bars Pt uses step-to pattern to get up 14 steps in home 3 steps to get into home (gets up those easily)  Step-in shower, no shower bench; pt has suction grab bar     Patient Goals: Able to walk better, improve balance    PRECAUTIONS: Fall history   SUBJECTIVE:                                                                                                                                                                                      SUBJECTIVE STATEMENT:  Patient reports feeling more "shaky"  or unsteady this AM; this is variable from day to day. Pt reports some R knee soreness after last session. Patient reports doing well with her initial home exercises.    PAIN:  Are you having pain? No   OBJECTIVE: (objective measures completed at initial evaluation unless otherwise dated)   Patient Surveys  FOTO: 7, predicted improvement to 74 ABC: 62.5%     Cognition Patient is oriented to person, place, and time.  Recent memory is intact.  Remote memory is intact.  Attention span and concentration are intact.   Expressive speech is intact.  Patient's fund of knowledge is within normal limits for educational level.                            Observation Venous distension  and varicose veins in bilat legs, color change. No inc tissue temperature, calf girth, pitting edema, or significant calf tenderness.     GAIT: Distance walked: 45 ft Assistive device utilized: Single point cane, RUE Level of assistance: SBA Comments: Pt walks with RLE externally rotated, wide BOS, decreased gait cadence, diminished heel strike, slow velocity    Posture: Pt rests in posterior pelvic tilt. Grossly kyphotic posture. Pes planus. Genu valgus R>L knee.      LE MMT:   MMT (out of 5) Right 10/06/2022 Left 10/06/2022  Hip flexion 4 4  Hip extension      Hip abduction 5 5  Hip adduction 5 5  Hip internal rotation      Hip external rotation      Knee flexion 5 5  Knee extension 5 5  Ankle dorsiflexion 4 5  Ankle plantarflexion      Ankle inversion      Ankle eversion      (* = pain; Blank rows = not tested)     Sensation Decreased light touch sensation in bilateral toes and dorsum of feet     Reflexes Deferred     Cranial Nerves Visual acuity and visual fields are intact  Extraocular muscles are intact; pt does have difficulty tracking vertically from top of visual field back to neutral position  Facial sensation is intact bilaterally  Facial strength is intact bilaterally  Hearing is normal as tested by gross conversation Palate elevates midline, normal phonation  Shoulder shrug strength is intact  Tongue protrudes midline     Coordination/Cerebellar Finger to Nose: WNL Heel to Shin: WNL Rapid alternating movements: WNL Finger Opposition: WNL Pronator Drift: Negative     FUNCTIONAL OUTCOME MEASURES     Results Comments  BERG 10/08/22: 48/56 Fall risk, in need of intervention  TUG 10/06/22: 19.5 seconds    5TSTS 10/06/22: 17 seconds    DGI 10/08/22: 11/24    10 Meter Gait Speed  10/08/22: Self-selected: s = 0.59 m/s; Fastest: s = 0.69 m/s Below normative values for full community ambulation  (Blank rows = not tested)       TODAY'S TREATMENT      Neuromuscular Re-education - for improved sensory integration, static and dynamic postural control, equilibrium and non-equilibrium coordination as needed for negotiating home and community environment and stepping over obstacles  In // bars:  Toe tapping; 6-inch step; 2x10 alternating R/L - for fleeting unipedal stance as needed for obstacle negotiation   Standing hurdle step; 1x10 with each LE, with dumbbell on floor    -verbal cueing for heel strike with each step  Feet together on Airex pad; 1x30sec  Semitandem on Airex pad; 1x30 sec in each position   Functional reaching with cones; 10 cones on adjacent table; 2 trials of forward reaching and cross-body reach to place cone down with each upper limb   -for postural control with forward leaning and reaching tasks  Standing feet together with postural perturbations, multi-directional; 2 x 1 minute    *next visit*   Therapeutic Exercise - improved strength as needed to improve performance of CKC activities/functional movements and as needed for power production to prevent fall during episode of large postural perturbation, completion of performance-based outcome measures to detect fall risk and track progress   NuStep; x 5 minutes with Level 3 resistance - for LE strengthening and lower limb mobility   -subjective information gathered during this time   In // bars:  Forward/backward stepping; 4x  D/B length of bars   Sidestepping; 4x D/B length of bars   *next visit* High knees in bars   *not today* Standing heel raise/toe raise; x10 alternating Standing march with light touch in single bar, in // bars; x10 alternating Sit to stand, reviewed      PATIENT EDUCATION:  Education details: see above for patient education details Person educated:  Patient Education method: Explanation, Demonstration, and Handouts Education comprehension: verbalized understanding     HOME EXERCISE PROGRAM: Access Code: [email or web address] URL: [email or web address] Date: 10/12/2022 Prepared by: Valentina Gu  Exercises - Sit to Stand Without Arm Support  - 2 x daily - 7 x weekly - 2 sets - 10 reps - Heel Toe Raises with Unilateral Counter Support  - 2 x daily - 7 x weekly - 2 sets - 10 reps - Standing March with Counter Support  - 2 x daily - 7 x weekly - 2 sets - 10 reps      ASSESSMENT:   CLINICAL IMPRESSION: Patient does have significantly increased postural sway with stance on compliance surface and with Semitandem standing. Pt is most notably challege with Semitandem stance with RLE in back. Patient is able to respond appropriately with forward perturbations today without significant LOB. Will continue balance challenges integrating tasks and circumstances that are most challenging for the patient. Patient has remaining deficits in impaired postural control, gait changes, hip and trunk control needed to prevent forward LOB with bending over/reaching to low-lying item.  Patient will benefit from skilled PT to address above impairments and improve overall function.   REHAB POTENTIAL: Good   CLINICAL DECISION MAKING: Evolving/moderate complexity   EVALUATION COMPLEXITY: Moderate     GOALS:   SHORT TERM GOALS: Target date: 10/30/2022   Pt will be independent with HEP in order to improve strength and balance in order to decrease fall risk and improve function at home. Baseline: 10/06/22: Baseline HEP to be initiated next visit  Goal status: INITIAL     LONG TERM GOALS: Target date: 12/04/2022   Pt will increase FOTO to at least 54 to demonstrate significant improvement in function at home related to balance  Baseline: 10/06/22: 57 Goal status: INITIAL   2.  Pt will improve BERG by at least 3 points in order to demonstrate  clinically significant improvement in balance.   Baseline: 10/06/22: Will complete next visit     10/08/22: 48/56 Goal status: INITIAL   3.  Pt will improve ABC by at least 13% in order to demonstrate clinically significant improvement in balance confidence.      Baseline: 10/06/22: Will complete next visit     10/08/22: 62.5% Goal status: INITIAL   4. Pt will decrease 5TSTS by at least 3 seconds in order to demonstrate clinically significant improvement in LE strength      Baseline: 10/06/22: 17 seconds Goal status: INITIAL   5. Pt will improve DGI by at least 3 points in order to demonstrate clinically significant improvement in balance and decreased risk for falls.     Baseline: 10/06/22: Baseline to be completed next visit     10/08/22: 11/24 Goal status: INITIAL   6. Pt will decrease TUG to below 14 seconds/decrease in order to demonstrate decreased fall risk.  Baseline: 10/06/22: 19.5 sec Goal status: INITIAL         PLAN: PT FREQUENCY: 2x/week   PT DURATION: 8-10 weeks   PLANNED INTERVENTIONS: Therapeutic exercises, Therapeutic activity, Neuromuscular re-education, Balance training, Gait training, Patient/Family education,  PLAN FOR NEXT SESSION: Continue with task practice for stepping with sound heel strike technique, postural control training, obstacle negotiation; strategies to improve ability to reach low to floor including squatting and functional reaching.    Valentina Gu, PT, DPT #[id number]  Eilleen Kempf, PT 10/13/2022, 2:24 PM

## 2022-10-15 ENCOUNTER — Ambulatory Visit: Payer: PPO | Admitting: Physical Therapy

## 2022-10-15 NOTE — Therapy (Deleted)
OUTPATIENT PHYSICAL THERAPY TREATMENT NOTE   Patient Name: Laurie Roy MRN: [id number] DOB:1941-06-24, 81 y.o., female Today's Date: 10/08/2022  PCP: Sallee Lange, NP REFERRING PROVIDER: Sallee Lange, NP  END OF SESSION:      Past Medical History:  Diagnosis Date   Breast cancer [facility]) 0350   Complication of anesthesia    Heart murmur    Personal history of chemotherapy    Personal history of radiation therapy    PONV (postoperative nausea and vomiting)    Past Surgical History:  Procedure Laterality Date   APPLICATION OF WOUND VAC Left 12/10/2020   Procedure: APPLICATION OF PREVENA  WOUND VAC;  Surgeon: Dereck Leep, MD;  Location: [facility];  Service: Orthopedics;  Laterality: Left;   COLONOSCOPY     KNEE ARTHROPLASTY Left 12/10/2020   Procedure: COMPUTER ASSISTED TOTAL KNEE ARTHROPLASTY;  Surgeon: Dereck Leep, MD;  Location: [facility];  Service: Orthopedics;  Laterality: Left;   LEFT HEART CATH AND CORONARY ANGIOGRAPHY Left 04/16/2021   Procedure: LEFT HEART CATH AND CORONARY ANGIOGRAPHY;  Surgeon: Corey Skains, MD;  Location: [facility];  Service: Cardiovascular;  Laterality: Left;   MASTECTOMY Left 2002   Patient Active Problem List   Diagnosis Date Noted   Angina pectoris ([place]) 04/16/2021   IGT (impaired glucose tolerance) 12/10/2020   Neuropathy 12/10/2020   Obesity (BMI 30-39.9) 12/10/2020   Primary osteoarthritis of left knee 09/16/2020   Primary osteoarthritis of right knee 09/16/2020   Polyarthralgia 03/26/2017   Vitamin B12 deficiency 02/01/2017   Vitamin D deficiency 02/01/2017   HTN (hypertension) 02/17/2014   Hypercholesterolemia 02/17/2014    REFERRING DIAG:  G62.9 (ICD-10-CM) - Polyneuropathy, unspecified  R26.89 (ICD-10-CM) - Other abnormalities of gait and mobility    THERAPY DIAG:  Imbalance  Difficulty in walking, not elsewhere classified  Muscle weakness (generalized)  Rationale for Evaluation and  Treatment Rehabilitation  PERTINENT HISTORY: Patient is an 81 year old female referred for balance and gait deficits with Hx of longstanding bilateral peripheral neuropathy, hx of L TKA (12/10/2020) and R knee pain secondary to OA. Patient reports SOB following panic attack during her L TKA rehab. Pt uses CPAP at night now for sleep apnea. She reports small heart blockage after that episode. She reports having increased difficulty walking during this workup with cardiology. Pt reports increased fluid accumulation from 230 lbs with her weight now being 179 lbs. Patient reports difficulty with walking at this time - she uses walker when going outside usually, especially on uneven ground. Pt finds herself walking without AD in home sometimes. Hx of vascular disease and peripheral neuropathy. Pt is notably challenged with fear of falling. Hx of dyspnea on exertion    Pain: Yes, R knee; doing better following cortisone injection  Numbness/Tingling: Yes, sensory change in hands and feet from neuropathy Focal Weakness: general LE weakness Recent changes in overall health/medication: Yes, heart blockage found  Prior history of physical therapy for balance:  No Falls: Has patient fallen in last 6 months? Yes, Number of falls: 4, pt fell when riding lawn mower when ducking under limb (pt fell backward off of lawnmower). Pt fell in home 2x when bending over to grab item from floor, pt fell forward when getting out of bed.  Directional pattern for falls: Yes, forward falling when bending over  Imaging: Yes    Radiograph L knee 12/10/20 Status post total knee replacement with prosthetic components well-seated. No fracture or dislocation. Acute postoperative changes noted.  Prior level of function: Independent Occupational demands: Retired  Office manager: Media planner (jewelry, painting)    Red flags (bowel/bladder changes, saddle paresthesia, personal history of cancer, h/o spinal tumors, h/o compression fx, h/o  abdominal aneurysm, abdominal pain, chills/fever, night sweats, nausea, vomiting, unrelenting pain): Positive for Hx of breast cancer (in remission), otherwise no other red flags    Weight Bearing Restrictions: No   Living Environment Lives with: lives alone, daughter lives close by  Lives in: House/apartment Has following equipment at home: Single point cane, Environmental consultant - 4 wheeled, and Grab bars Pt uses step-to pattern to get up 14 steps in home 3 steps to get into home (gets up those easily)  Step-in shower, no shower bench; pt has suction grab bar     Patient Goals: Able to walk better, improve balance    PRECAUTIONS: Fall history   SUBJECTIVE:                                                                                                                                                                                      SUBJECTIVE STATEMENT:  Patient reports feeling more "shaky"  or unsteady this AM; this is variable from day to day. Pt reports some R knee soreness after last session. Patient reports doing well with her initial home exercises.    PAIN:  Are you having pain? No   OBJECTIVE: (objective measures completed at initial evaluation unless otherwise dated)   Patient Surveys  FOTO: 40, predicted improvement to 37 ABC: 62.5%     Cognition Patient is oriented to person, place, and time.  Recent memory is intact.  Remote memory is intact.  Attention span and concentration are intact.  Expressive speech is intact.  Patient's fund of knowledge is within normal limits for educational level.                            Observation Venous distension and varicose veins in bilat legs, color change. No inc tissue temperature, calf girth, pitting edema, or significant calf tenderness.     GAIT: Distance walked: 45 ft Assistive device utilized: Single point cane, RUE Level of assistance: SBA Comments: Pt walks with RLE externally rotated, wide BOS, decreased gait  cadence, diminished heel strike, slow velocity    Posture: Pt rests in posterior pelvic tilt. Grossly kyphotic posture. Pes planus. Genu valgus R>L knee.      LE MMT:   MMT (out of 5) Right 10/06/2022 Left 10/06/2022  Hip flexion 4 4  Hip extension      Hip abduction 5 5  Hip adduction 5 5  Hip internal rotation  Hip external rotation      Knee flexion 5 5  Knee extension 5 5  Ankle dorsiflexion 4 5  Ankle plantarflexion      Ankle inversion      Ankle eversion      (* = pain; Blank rows = not tested)     Sensation Decreased light touch sensation in bilateral toes and dorsum of feet     Reflexes Deferred     Cranial Nerves Visual acuity and visual fields are intact  Extraocular muscles are intact; pt does have difficulty tracking vertically from top of visual field back to neutral position  Facial sensation is intact bilaterally  Facial strength is intact bilaterally  Hearing is normal as tested by gross conversation Palate elevates midline, normal phonation  Shoulder shrug strength is intact  Tongue protrudes midline     Coordination/Cerebellar Finger to Nose: WNL Heel to Shin: WNL Rapid alternating movements: WNL Finger Opposition: WNL Pronator Drift: Negative     FUNCTIONAL OUTCOME MEASURES     Results Comments  BERG 10/08/22: 48/56 Fall risk, in need of intervention  TUG 10/06/22: 19.5 seconds    5TSTS 10/06/22: 17 seconds    DGI 10/08/22: 11/24    10 Meter Gait Speed 10/08/22: Self-selected: s = 0.59 m/s; Fastest: s = 0.69 m/s Below normative values for full community ambulation  (Blank rows = not tested)       TODAY'S TREATMENT      Neuromuscular Re-education - for improved sensory integration, static and dynamic postural control, equilibrium and non-equilibrium coordination as needed for negotiating home and community environment and stepping over obstacles  In // bars:  Toe tapping; 6-inch step; 2x10 alternating R/L - for fleeting unipedal  stance as needed for obstacle negotiation   Standing hurdle step; 1x10 with each LE, with dumbbell on floor    -verbal cueing for heel strike with each step  Feet together on Airex pad; 1x30sec  Semitandem on Airex pad; 1x30 sec in each position   Functional reaching with cones; 10 cones on adjacent table; 2 trials of forward reaching and cross-body reach to place cone down with each upper limb   -for postural control with forward leaning and reaching tasks  Standing feet together with postural perturbations, multi-directional; 2 x 1 minute    *next visit*   Therapeutic Exercise - improved strength as needed to improve performance of CKC activities/functional movements and as needed for power production to prevent fall during episode of large postural perturbation, completion of performance-based outcome measures to detect fall risk and track progress   NuStep; x 5 minutes with Level 3 resistance - for LE strengthening and lower limb mobility   -subjective information gathered during this time   In // bars:  Forward/backward stepping; 4x D/B length of bars   Sidestepping; 4x D/B length of bars   *next visit* High knees in bars   *not today* Standing heel raise/toe raise; x10 alternating Standing march with light touch in single bar, in // bars; x10 alternating Sit to stand, reviewed      PATIENT EDUCATION:  Education details: see above for patient education details Person educated: Patient Education method: Explanation, Demonstration, and Handouts Education comprehension: verbalized understanding     HOME EXERCISE PROGRAM: Access Code: [email or web address] URL: [email or web address] Date: 10/12/2022 Prepared by: Valentina Gu  Exercises - Sit to Stand Without Arm Support  - 2 x daily - 7 x weekly - 2 sets - 10 reps - Heel Toe Raises with Unilateral  Counter Support  - 2 x daily - 7 x weekly - 2 sets - 10 reps - Standing March with Counter Support  - 2 x daily - 7  x weekly - 2 sets - 10 reps      ASSESSMENT:   CLINICAL IMPRESSION: Patient does have significantly increased postural sway with stance on compliance surface and with Semitandem standing. Pt is most notably challege with Semitandem stance with RLE in back. Patient is able to respond appropriately with forward perturbations today without significant LOB. Will continue balance challenges integrating tasks and circumstances that are most challenging for the patient. Patient has remaining deficits in impaired postural control, gait changes, hip and trunk control needed to prevent forward LOB with bending over/reaching to low-lying item.  Patient will benefit from skilled PT to address above impairments and improve overall function.   REHAB POTENTIAL: Good   CLINICAL DECISION MAKING: Evolving/moderate complexity   EVALUATION COMPLEXITY: Moderate     GOALS:   SHORT TERM GOALS: Target date: 10/30/2022   Pt will be independent with HEP in order to improve strength and balance in order to decrease fall risk and improve function at home. Baseline: 10/06/22: Baseline HEP to be initiated next visit  Goal status: INITIAL     LONG TERM GOALS: Target date: 12/04/2022   Pt will increase FOTO to at least 54 to demonstrate significant improvement in function at home related to balance  Baseline: 10/06/22: 57 Goal status: INITIAL   2.  Pt will improve BERG by at least 3 points in order to demonstrate clinically significant improvement in balance.   Baseline: 10/06/22: Will complete next visit     10/08/22: 48/56 Goal status: INITIAL   3.  Pt will improve ABC by at least 13% in order to demonstrate clinically significant improvement in balance confidence.      Baseline: 10/06/22: Will complete next visit     10/08/22: 62.5% Goal status: INITIAL   4. Pt will decrease 5TSTS by at least 3 seconds in order to demonstrate clinically significant improvement in LE strength      Baseline: 10/06/22: 17  seconds Goal status: INITIAL   5. Pt will improve DGI by at least 3 points in order to demonstrate clinically significant improvement in balance and decreased risk for falls.     Baseline: 10/06/22: Baseline to be completed next visit     10/08/22: 11/24 Goal status: INITIAL   6. Pt will decrease TUG to below 14 seconds/decrease in order to demonstrate decreased fall risk.  Baseline: 10/06/22: 19.5 sec Goal status: INITIAL         PLAN: PT FREQUENCY: 2x/week   PT DURATION: 8-10 weeks   PLANNED INTERVENTIONS: Therapeutic exercises, Therapeutic activity, Neuromuscular re-education, Balance training, Gait training, Patient/Family education,    PLAN FOR NEXT SESSION: Continue with task practice for stepping with sound heel strike technique, postural control training, obstacle negotiation; strategies to improve ability to reach low to floor including squatting and functional reaching.    Valentina Gu, PT, DPT #[id number]  Eilleen Kempf, PT 10/15/2022, 1:52 PM

## 2022-10-20 ENCOUNTER — Encounter: Payer: Self-pay | Admitting: Physical Therapy

## 2022-10-20 ENCOUNTER — Ambulatory Visit: Payer: PPO | Admitting: Physical Therapy

## 2022-10-20 DIAGNOSIS — M6281 Muscle weakness (generalized): Secondary | ICD-10-CM

## 2022-10-20 DIAGNOSIS — R2689 Other abnormalities of gait and mobility: Secondary | ICD-10-CM

## 2022-10-20 DIAGNOSIS — R262 Difficulty in walking, not elsewhere classified: Secondary | ICD-10-CM

## 2022-10-20 NOTE — Therapy (Signed)
OUTPATIENT PHYSICAL THERAPY TREATMENT NOTE   Patient Name: Laurie Roy MRN: [id number] DOB:[birth year], 81 y.o., female Today's Date: 10/08/2022  PCP: Sallee Lange, NP REFERRING PROVIDER: Sallee Lange, NP  END OF SESSION:   PT End of Session - 10/20/22 1414     Visit Number 4    Number of Visits 17    Date for PT Re-Evaluation 12/01/22    Authorization Type HTA, visit limit based on medical necessity; no auth    Progress Note Due on Visit 10    PT Start Time 1410    PT Stop Time 1454    PT Time Calculation (min) 44 min    Equipment Utilized During Treatment Gait belt    Activity Tolerance Patient tolerated treatment well    Behavior During Therapy WFL for tasks assessed/performed               Past Medical History:  Diagnosis Date   Breast cancer ([place]) 1017   Complication of anesthesia    Heart murmur    Personal history of chemotherapy    Personal history of radiation therapy    PONV (postoperative nausea and vomiting)    Past Surgical History:  Procedure Laterality Date   APPLICATION OF WOUND VAC Left 12/10/2020   Procedure: APPLICATION OF PREVENA  WOUND VAC;  Surgeon: Dereck Leep, MD;  Location: [facility];  Service: Orthopedics;  Laterality: Left;   COLONOSCOPY     KNEE ARTHROPLASTY Left 12/10/2020   Procedure: COMPUTER ASSISTED TOTAL KNEE ARTHROPLASTY;  Surgeon: Dereck Leep, MD;  Location: [facility];  Service: Orthopedics;  Laterality: Left;   LEFT HEART CATH AND CORONARY ANGIOGRAPHY Left 04/16/2021   Procedure: LEFT HEART CATH AND CORONARY ANGIOGRAPHY;  Surgeon: Corey Skains, MD;  Location: [place] [facility];  Service: Cardiovascular;  Laterality: Left;   MASTECTOMY Left 2002   Patient Active Problem List   Diagnosis Date Noted   Angina pectoris ([place]) 04/16/2021   IGT (impaired glucose tolerance) 12/10/2020   Neuropathy 12/10/2020   Obesity (BMI 30-39.9) 12/10/2020   Primary osteoarthritis of left knee 09/16/2020   Primary  osteoarthritis of right knee 09/16/2020   Polyarthralgia 03/26/2017   Vitamin B12 deficiency 02/01/2017   Vitamin D deficiency 02/01/2017   HTN (hypertension) 02/17/2014   Hypercholesterolemia 02/17/2014    REFERRING DIAG:  G62.9 (ICD-10-CM) - Polyneuropathy, unspecified  R26.89 (ICD-10-CM) - Other abnormalities of gait and mobility    THERAPY DIAG:  Imbalance  Difficulty in walking, not elsewhere classified  Muscle weakness (generalized)  Rationale for Evaluation and Treatment Rehabilitation  PERTINENT HISTORY: Patient is an 81 year old female referred for balance and gait deficits with Hx of longstanding bilateral peripheral neuropathy, hx of L TKA (12/10/2020) and R knee pain secondary to OA. Patient reports SOB following panic attack during her L TKA rehab. Pt uses CPAP at night now for sleep apnea. She reports small heart blockage after that episode. She reports having increased difficulty walking during this workup with cardiology. Pt reports increased fluid accumulation from 230 lbs with her weight now being 179 lbs. Patient reports difficulty with walking at this time - she uses walker when going outside usually, especially on uneven ground. Pt finds herself walking without AD in home sometimes. Hx of vascular disease and peripheral neuropathy. Pt is notably challenged with fear of falling. Hx of dyspnea on exertion    Pain: Yes, R knee; doing better following cortisone injection  Numbness/Tingling: Yes, sensory change in hands and feet  from neuropathy Focal Weakness: general LE weakness Recent changes in overall health/medication: Yes, heart blockage found  Prior history of physical therapy for balance:  No Falls: Has patient fallen in last 6 months? Yes, Number of falls: 4, pt fell when riding lawn mower when ducking under limb (pt fell backward off of lawnmower). Pt fell in home 2x when bending over to grab item from floor, pt fell forward when getting out of bed.  Directional  pattern for falls: Yes, forward falling when bending over  Imaging: Yes    Radiograph L knee 12/10/20 Status post total knee replacement with prosthetic components well-seated. No fracture or dislocation. Acute postoperative changes noted.       Prior level of function: Independent Occupational demands: Retired  Office manager: Media planner (jewelry, painting)    Red flags (bowel/bladder changes, saddle paresthesia, personal history of cancer, h/o spinal tumors, h/o compression fx, h/o abdominal aneurysm, abdominal pain, chills/fever, night sweats, nausea, vomiting, unrelenting pain): Positive for Hx of breast cancer (in remission), otherwise no other red flags    Weight Bearing Restrictions: No   Living Environment Lives with: lives alone, daughter lives close by  Lives in: House/apartment Has following equipment at home: Single point cane, Environmental consultant - 4 wheeled, and Grab bars Pt uses step-to pattern to get up 14 steps in home 3 steps to get into home (gets up those easily)  Step-in shower, no shower bench; pt has suction grab bar     Patient Goals: Able to walk better, improve balance    PRECAUTIONS: Fall history   SUBJECTIVE:                                                                                                                                                                                      SUBJECTIVE STATEMENT:  Patient reports feeling more stiff and having more knee pain. Patient reports no recent near-falls or safety incidents. Pt reports tolerating last visit well.   PAIN:  Are you having pain? Yes, 3/10 pain in her R knee anteriorly   OBJECTIVE: (objective measures completed at initial evaluation unless otherwise dated)   Patient Surveys  FOTO: 94, predicted improvement to 33 ABC: 62.5%     Cognition Patient is oriented to person, place, and time.  Recent memory is intact.  Remote memory is intact.  Attention span and concentration are intact.  Expressive  speech is intact.  Patient's fund of knowledge is within normal limits for educational level.                            Observation Venous distension and varicose veins in  bilat legs, color change. No inc tissue temperature, calf girth, pitting edema, or significant calf tenderness.     GAIT: Distance walked: 45 ft Assistive device utilized: Single point cane, RUE Level of assistance: SBA Comments: Pt walks with RLE externally rotated, wide BOS, decreased gait cadence, diminished heel strike, slow velocity    Posture: Pt rests in posterior pelvic tilt. Grossly kyphotic posture. Pes planus. Genu valgus R>L knee.      LE MMT:   MMT (out of 5) Right 10/06/2022 Left 10/06/2022  Hip flexion 4 4  Hip extension      Hip abduction 5 5  Hip adduction 5 5  Hip internal rotation      Hip external rotation      Knee flexion 5 5  Knee extension 5 5  Ankle dorsiflexion 4 5  Ankle plantarflexion      Ankle inversion      Ankle eversion      (* = pain; Blank rows = not tested)     Sensation Decreased light touch sensation in bilateral toes and dorsum of feet     Reflexes Deferred     Cranial Nerves Visual acuity and visual fields are intact  Extraocular muscles are intact; pt does have difficulty tracking vertically from top of visual field back to neutral position  Facial sensation is intact bilaterally  Facial strength is intact bilaterally  Hearing is normal as tested by gross conversation Palate elevates midline, normal phonation  Shoulder shrug strength is intact  Tongue protrudes midline     Coordination/Cerebellar Finger to Nose: WNL Heel to Shin: WNL Rapid alternating movements: WNL Finger Opposition: WNL Pronator Drift: Negative     FUNCTIONAL OUTCOME MEASURES     Results Comments  BERG 10/08/22: 48/56 Fall risk, in need of intervention  TUG 10/06/22: 19.5 seconds    5TSTS 10/06/22: 17 seconds    DGI 10/08/22: 11/24    10 Meter Gait Speed  10/08/22: Self-selected: s = 0.59 m/s; Fastest: s = 0.69 m/s Below normative values for full community ambulation  (Blank rows = not tested)       TODAY'S TREATMENT      Neuromuscular Re-education - for improved sensory integration, static and dynamic postural control, equilibrium and non-equilibrium coordination as needed for negotiating home and community environment and stepping over obstacles  In // bars:  Toe tapping; 6-inch step; 2x10 alternating R/L - for fleeting unipedal stance as needed for obstacle negotiation   Standing hurdle step; 1x10 with each LE, with dumbbell on floor    -verbal cueing for heel strike with each step    Semitandem on Airex pad; 2x30 sec in each position   Functional reaching with cones; 10 cones on adjacent table; 2 trials of forward reaching and cross-body reach to place cone down with each upper limb   -for postural control with forward leaning and reaching tasks  Standing in stride stance with postural perturbations, multi-directional; 2 x 1 minute    Sit to stand with Airex under feet; additional Airex on chair to increase chair height; performed 2x8  -verbal cueing to limit pushing posterior thigh on edge of chair `   *not today* Feet together on Airex pad; 1x30sec   Therapeutic Exercise - improved strength as needed to improve performance of CKC activities/functional movements and as needed for power production to prevent fall during episode of large postural perturbation, completion of performance-based outcome measures to detect fall risk and track progress   NuStep; x 5  minutes with Level 3 resistance - for LE strengthening and lower limb mobility   -subjective information gathered during this time   In // bars:  Forward/backward stepping; 4x D/B length of bars; verbal cueing to limit toe-out  Sidestepping; 4x D/B length of bars High knees in bars; 3x D/B    *not today* Standing heel raise/toe raise; x10 alternating Standing march  with light touch in single bar, in // bars; x10 alternating Sit to stand, reviewed      PATIENT EDUCATION:  Education details: see above for patient education details Person educated: Patient Education method: Explanation, Demonstration, and Handouts Education comprehension: verbalized understanding     HOME EXERCISE PROGRAM: Access Code: [email or web address] URL: [email or web address] Date: 10/12/2022 Prepared by: Valentina Gu  Exercises - Sit to Stand Without Arm Support  - 2 x daily - 7 x weekly - 2 sets - 10 reps - Heel Toe Raises with Unilateral Counter Support  - 2 x daily - 7 x weekly - 2 sets - 10 reps - Standing March with Counter Support  - 2 x daily - 7 x weekly - 2 sets - 10 reps      ASSESSMENT:   CLINICAL IMPRESSION: Patient has comorbid R knee pain and stiffness associated with OA. This is improved with exercise in clinic today (non-specific effect with no certain drills targeting knee impairments). Pt is able to progress static postural control work on Airex to include Semitandem standing versus feet together and she is able to maintain balance with perturbations in more unstable position. Pt reports minimal fatigue at end of session, so exercise can be progressed in intensity. Patient has remaining deficits in impaired postural control, gait changes, hip and trunk control needed to prevent forward LOB with bending over/reaching to low-lying item.  Patient will benefit from skilled PT to address above impairments and improve overall function.   REHAB POTENTIAL: Good   CLINICAL DECISION MAKING: Evolving/moderate complexity   EVALUATION COMPLEXITY: Moderate     GOALS:   SHORT TERM GOALS: Target date: 10/30/2022   Pt will be independent with HEP in order to improve strength and balance in order to decrease fall risk and improve function at home. Baseline: 10/06/22: Baseline HEP to be initiated next visit  Goal status: INITIAL     LONG TERM GOALS: Target  date: 12/04/2022   Pt will increase FOTO to at least 54 to demonstrate significant improvement in function at home related to balance  Baseline: 10/06/22: 57 Goal status: INITIAL   2.  Pt will improve BERG by at least 3 points in order to demonstrate clinically significant improvement in balance.   Baseline: 10/06/22: Will complete next visit     10/08/22: 48/56 Goal status: INITIAL   3.  Pt will improve ABC by at least 13% in order to demonstrate clinically significant improvement in balance confidence.      Baseline: 10/06/22: Will complete next visit     10/08/22: 62.5% Goal status: INITIAL   4. Pt will decrease 5TSTS by at least 3 seconds in order to demonstrate clinically significant improvement in LE strength      Baseline: 10/06/22: 17 seconds Goal status: INITIAL   5. Pt will improve DGI by at least 3 points in order to demonstrate clinically significant improvement in balance and decreased risk for falls.     Baseline: 10/06/22: Baseline to be completed next visit     10/08/22: 11/24 Goal status: INITIAL   6. Pt will decrease TUG to below 14  seconds/decrease in order to demonstrate decreased fall risk.  Baseline: 10/06/22: 19.5 sec Goal status: INITIAL         PLAN: PT FREQUENCY: 2x/week   PT DURATION: 8-10 weeks   PLANNED INTERVENTIONS: Therapeutic exercises, Therapeutic activity, Neuromuscular re-education, Balance training, Gait training, Patient/Family education,    PLAN FOR NEXT SESSION: Continue with task practice for stepping with sound heel strike technique, postural control training, obstacle negotiation; strategies to improve ability to reach low to floor including squatting and functional reaching.    Valentina Gu, PT, DPT #[id number]  Laurie Roy, PT 10/20/2022, 2:14 PM

## 2022-10-22 ENCOUNTER — Ambulatory Visit: Payer: PPO | Admitting: Physical Therapy

## 2022-10-22 DIAGNOSIS — R2689 Other abnormalities of gait and mobility: Secondary | ICD-10-CM

## 2022-10-22 DIAGNOSIS — R262 Difficulty in walking, not elsewhere classified: Secondary | ICD-10-CM

## 2022-10-22 DIAGNOSIS — M6281 Muscle weakness (generalized): Secondary | ICD-10-CM

## 2022-10-22 NOTE — Therapy (Signed)
OUTPATIENT PHYSICAL THERAPY TREATMENT NOTE   Patient Name: Laurie Roy MRN: [id number] DOB:1940/11/30, 81 y.o., female Today's Date: 10/08/2022  PCP: Sallee Lange, NP REFERRING PROVIDER: Sallee Lange, NP  END OF SESSION:   PT End of Session - 10/22/22 1415     Visit Number 5    Number of Visits 17    Date for PT Re-Evaluation 12/01/22    Authorization Type HTA, visit limit based on medical necessity; no auth    Progress Note Due on Visit 10    PT Start Time 1411    PT Stop Time 1455    PT Time Calculation (min) 44 min    Equipment Utilized During Treatment Gait belt    Activity Tolerance Patient tolerated treatment well    Behavior During Therapy WFL for tasks assessed/performed             Past Medical History:  Diagnosis Date   Breast cancer ([place]) 4854   Complication of anesthesia    Heart murmur    Personal history of chemotherapy    Personal history of radiation therapy    PONV (postoperative nausea and vomiting)    Past Surgical History:  Procedure Laterality Date   APPLICATION OF WOUND VAC Left 12/10/2020   Procedure: APPLICATION OF PREVENA  WOUND VAC;  Surgeon: Dereck Leep, MD;  Location: [facility];  Service: Orthopedics;  Laterality: Left;   COLONOSCOPY     KNEE ARTHROPLASTY Left 12/10/2020   Procedure: COMPUTER ASSISTED TOTAL KNEE ARTHROPLASTY;  Surgeon: Dereck Leep, MD;  Location: [facility];  Service: Orthopedics;  Laterality: Left;   LEFT HEART CATH AND CORONARY ANGIOGRAPHY Left 04/16/2021   Procedure: LEFT HEART CATH AND CORONARY ANGIOGRAPHY;  Surgeon: Corey Skains, MD;  Location: [facility];  Service: Cardiovascular;  Laterality: Left;   MASTECTOMY Left 2002   Patient Active Problem List   Diagnosis Date Noted   Angina pectoris ([place]) 04/16/2021   IGT (impaired glucose tolerance) 12/10/2020   Neuropathy 12/10/2020   Obesity (BMI 30-39.9) 12/10/2020   Primary osteoarthritis of left knee 09/16/2020   Primary  osteoarthritis of right knee 09/16/2020   Polyarthralgia 03/26/2017   Vitamin B12 deficiency 02/01/2017   Vitamin D deficiency 02/01/2017   HTN (hypertension) 02/17/2014   Hypercholesterolemia 02/17/2014    REFERRING DIAG:  G62.9 (ICD-10-CM) - Polyneuropathy, unspecified  R26.89 (ICD-10-CM) - Other abnormalities of gait and mobility    THERAPY DIAG:  Imbalance  Difficulty in walking, not elsewhere classified  Muscle weakness (generalized)  Rationale for Evaluation and Treatment Rehabilitation  PERTINENT HISTORY: Patient is an 81 year old female referred for balance and gait deficits with Hx of longstanding bilateral peripheral neuropathy, hx of L TKA (12/10/2020) and R knee pain secondary to OA. Patient reports SOB following panic attack during her L TKA rehab. Pt uses CPAP at night now for sleep apnea. She reports small heart blockage after that episode. She reports having increased difficulty walking during this workup with cardiology. Pt reports increased fluid accumulation from 230 lbs with her weight now being 179 lbs. Patient reports difficulty with walking at this time - she uses walker when going outside usually, especially on uneven ground. Pt finds herself walking without AD in home sometimes. Hx of vascular disease and peripheral neuropathy. Pt is notably challenged with fear of falling. Hx of dyspnea on exertion    Pain: Yes, R knee; doing better following cortisone injection  Numbness/Tingling: Yes, sensory change in hands and feet from neuropathy  Focal Weakness: general LE weakness Recent changes in overall health/medication: Yes, heart blockage found  Prior history of physical therapy for balance:  No Falls: Has patient fallen in last 6 months? Yes, Number of falls: 4, pt fell when riding lawn mower when ducking under limb (pt fell backward off of lawnmower). Pt fell in home 2x when bending over to grab item from floor, pt fell forward when getting out of bed.  Directional  pattern for falls: Yes, forward falling when bending over  Imaging: Yes    Radiograph L knee 12/10/20 Status post total knee replacement with prosthetic components well-seated. No fracture or dislocation. Acute postoperative changes noted.       Prior level of function: Independent Occupational demands: Retired  Office manager: Media planner (jewelry, painting)    Red flags (bowel/bladder changes, saddle paresthesia, personal history of cancer, h/o spinal tumors, h/o compression fx, h/o abdominal aneurysm, abdominal pain, chills/fever, night sweats, nausea, vomiting, unrelenting pain): Positive for Hx of breast cancer (in remission), otherwise no other red flags    Weight Bearing Restrictions: No   Living Environment Lives with: lives alone, daughter lives close by  Lives in: House/apartment Has following equipment at home: Single point cane, Environmental consultant - 4 wheeled, and Grab bars Pt uses step-to pattern to get up 14 steps in home 3 steps to get into home (gets up those easily)  Step-in shower, no shower bench; pt has suction grab bar     Patient Goals: Able to walk better, improve balance    PRECAUTIONS: Fall history   SUBJECTIVE:                                                                                                                                                                                      SUBJECTIVE STATEMENT:  Patient reports she has misplaced her cane and has another one at home she can use. Patient reports no near-fall incidents or recent safety concerns. Patient reports ongoing pain in R knee related to cold weather.   PAIN:  Are you having pain? Yes, 2-3/10 pain in her R knee   OBJECTIVE: (objective measures completed at initial evaluation unless otherwise dated)   Patient Surveys  FOTO: 35, predicted improvement to 64 ABC: 62.5%     Cognition Patient is oriented to person, place, and time.  Recent memory is intact.  Remote memory is intact.  Attention span  and concentration are intact.  Expressive speech is intact.  Patient's fund of knowledge is within normal limits for educational level.  Observation Venous distension and varicose veins in bilat legs, color change. No inc tissue temperature, calf girth, pitting edema, or significant calf tenderness.     GAIT: Distance walked: 45 ft Assistive device utilized: Single point cane, RUE Level of assistance: SBA Comments: Pt walks with RLE externally rotated, wide BOS, decreased gait cadence, diminished heel strike, slow velocity    Posture: Pt rests in posterior pelvic tilt. Grossly kyphotic posture. Pes planus. Genu valgus R>L knee.      LE MMT:   MMT (out of 5) Right 10/06/2022 Left 10/06/2022  Hip flexion 4 4  Hip extension      Hip abduction 5 5  Hip adduction 5 5  Hip internal rotation      Hip external rotation      Knee flexion 5 5  Knee extension 5 5  Ankle dorsiflexion 4 5  Ankle plantarflexion      Ankle inversion      Ankle eversion      (* = pain; Blank rows = not tested)     Sensation Decreased light touch sensation in bilateral toes and dorsum of feet     Reflexes Deferred     Cranial Nerves Visual acuity and visual fields are intact  Extraocular muscles are intact; pt does have difficulty tracking vertically from top of visual field back to neutral position  Facial sensation is intact bilaterally  Facial strength is intact bilaterally  Hearing is normal as tested by gross conversation Palate elevates midline, normal phonation  Shoulder shrug strength is intact  Tongue protrudes midline     Coordination/Cerebellar Finger to Nose: WNL Heel to Shin: WNL Rapid alternating movements: WNL Finger Opposition: WNL Pronator Drift: Negative     FUNCTIONAL OUTCOME MEASURES     Results Comments  BERG 10/08/22: 48/56 Fall risk, in need of intervention  TUG 10/06/22: 19.5 seconds    5TSTS 10/06/22: 17 seconds    DGI 10/08/22: 11/24     10 Meter Gait Speed 10/08/22: Self-selected: s = 0.59 m/s; Fastest: s = 0.69 m/s Below normative values for full community ambulation  (Blank rows = not tested)       TODAY'S TREATMENT      Neuromuscular Re-education - for improved sensory integration, static and dynamic postural control, equilibrium and non-equilibrium coordination as needed for negotiating home and community environment and stepping over obstacles  In // bars:  Toe tapping, with bilat standing; alternating R/L 1x10 on 6-inch step and 1x10 on 6-inch step with Airex pad on top - for fleeting unipedal stance as needed for obstacle negotiation    -attempted with standing on Airex pad, pt had repeated posterior LOB  Standing hurdle step; 1x10 with each LE, with 6-inch hurdle on floor   -intermittent LUE touch on parallel bar for balance  Semitandem on Airex pad; 1x30 sec in each position   Airex pad standing march; 1x15 alternating  Standing in stride stance with postural perturbations, multi-directional; 2 x 1 minute     Sit to stand with Airex under feet; additional Airex on chair to increase chair height; performed 2x10  -verbal cueing to limit pushing posterior thigh on edge of chair `   Tagging sticky notes on wall with ball; multi-directional reaching requiring bending/leaning; x 3 minutes    *not today* Functional reaching with cones; 10 cones on adjacent table; 2 trials of forward reaching and cross-body reach to place cone down with each upper limb   -for postural control with forward leaning and  reaching tasks Feet together on Airex pad; 1x30sec   Therapeutic Exercise - improved strength as needed to improve performance of CKC activities/functional movements and as needed for power production to prevent fall during episode of large postural perturbation, completion of performance-based outcome measures to detect fall risk and track progress   NuStep; x 5 minutes with Level 3 resistance - for LE  strengthening and lower limb mobility   -subjective information gathered during this time  In // bars:  Forward/backward stepping; 4x D/B length of bars; verbal cueing to limit toe-out  Sidestepping with Red Tband just superior to bilat patellae; 4x D/B length of bars    *not today* High knees in bars; 3x D/B  Standing heel raise/toe raise; x10 alternating Standing march with light touch in single bar, in // bars; x10 alternating Sit to stand, reviewed      PATIENT EDUCATION:  Education details: see above for patient education details Person educated: Patient Education method: Explanation, Demonstration, and Handouts Education comprehension: verbalized understanding     HOME EXERCISE PROGRAM: Access Code: [email or web address] URL: [email or web address] Date: 10/12/2022 Prepared by: Valentina Gu  Exercises - Sit to Stand Without Arm Support  - 2 x daily - 7 x weekly - 2 sets - 10 reps - Heel Toe Raises with Unilateral Counter Support  - 2 x daily - 7 x weekly - 2 sets - 10 reps - Standing March with Counter Support  - 2 x daily - 7 x weekly - 2 sets - 10 reps      ASSESSMENT:   CLINICAL IMPRESSION: Patient does have moderate fatigue with intensity of session today. Patient is significantly challenged with stepping over 6-inch obstacle and with dynamic movements with standing on uneven surface (e.g. marching and attempted toe tapping on Airex). She responds well with postural perturbations and is able to perform reaching task low to ground without forward LOB at this time. Patient has remaining deficits in impaired postural control, gait changes, hip and trunk control needed to prevent forward LOB with bending over/reaching to low-lying item.  Patient will benefit from skilled PT to address above impairments and improve overall function.   REHAB POTENTIAL: Good   CLINICAL DECISION MAKING: Evolving/moderate complexity   EVALUATION COMPLEXITY: Moderate     GOALS:    SHORT TERM GOALS: Target date: 10/30/2022   Pt will be independent with HEP in order to improve strength and balance in order to decrease fall risk and improve function at home. Baseline: 10/06/22: Baseline HEP to be initiated next visit  Goal status: INITIAL     LONG TERM GOALS: Target date: 12/04/2022   Pt will increase FOTO to at least 54 to demonstrate significant improvement in function at home related to balance  Baseline: 10/06/22: 57 Goal status: INITIAL   2.  Pt will improve BERG by at least 3 points in order to demonstrate clinically significant improvement in balance.   Baseline: 10/06/22: Will complete next visit     10/08/22: 48/56 Goal status: INITIAL   3.  Pt will improve ABC by at least 13% in order to demonstrate clinically significant improvement in balance confidence.      Baseline: 10/06/22: Will complete next visit     10/08/22: 62.5% Goal status: INITIAL   4. Pt will decrease 5TSTS by at least 3 seconds in order to demonstrate clinically significant improvement in LE strength      Baseline: 10/06/22: 17 seconds Goal status: INITIAL   5. Pt will improve DGI by at  least 3 points in order to demonstrate clinically significant improvement in balance and decreased risk for falls.     Baseline: 10/06/22: Baseline to be completed next visit     10/08/22: 11/24 Goal status: INITIAL   6. Pt will decrease TUG to below 14 seconds/decrease in order to demonstrate decreased fall risk.  Baseline: 10/06/22: 19.5 sec Goal status: INITIAL         PLAN: PT FREQUENCY: 2x/week   PT DURATION: 8-10 weeks   PLANNED INTERVENTIONS: Therapeutic exercises, Therapeutic activity, Neuromuscular re-education, Balance training, Gait training, Patient/Family education,    PLAN FOR NEXT SESSION: Continue with task practice for stepping with sound heel strike technique, postural control training, obstacle negotiation; strategies to improve ability to reach low to floor including squatting and  functional reaching.    Valentina Gu, PT, DPT #[id number]  Eilleen Kempf, PT 10/22/2022, 2:16 PM

## 2022-10-29 ENCOUNTER — Ambulatory Visit: Payer: PPO | Admitting: Physical Therapy

## 2022-10-29 ENCOUNTER — Encounter: Payer: Self-pay | Admitting: Physical Therapy

## 2022-10-29 DIAGNOSIS — M6281 Muscle weakness (generalized): Secondary | ICD-10-CM

## 2022-10-29 DIAGNOSIS — R2689 Other abnormalities of gait and mobility: Secondary | ICD-10-CM

## 2022-10-29 DIAGNOSIS — R262 Difficulty in walking, not elsewhere classified: Secondary | ICD-10-CM

## 2022-10-29 NOTE — Therapy (Signed)
OUTPATIENT PHYSICAL THERAPY TREATMENT NOTE   Patient Name: Laurie Roy MRN: [id number] DOB:28-Oct-1941, 81 y.o., female Today's Date: 10/08/2022  PCP: Sallee Lange, NP REFERRING PROVIDER: Sallee Lange, NP  END OF SESSION:   PT End of Session - 10/29/22 1422     Visit Number 6    Number of Visits 17    Date for PT Re-Evaluation 12/01/22    Authorization Type HTA, visit limit based on medical necessity; no auth    Progress Note Due on Visit 10    PT Start Time 1416    PT Stop Time 1459    PT Time Calculation (min) 43 min    Equipment Utilized During Treatment Gait belt    Activity Tolerance Patient tolerated treatment well    Behavior During Therapy WFL for tasks assessed/performed              Past Medical History:  Diagnosis Date   Breast cancer ([place]) 7062   Complication of anesthesia    Heart murmur    Personal history of chemotherapy    Personal history of radiation therapy    PONV (postoperative nausea and vomiting)    Past Surgical History:  Procedure Laterality Date   APPLICATION OF WOUND VAC Left 12/10/2020   Procedure: APPLICATION OF PREVENA  WOUND VAC;  Surgeon: Dereck Leep, MD;  Location: [facility];  Service: Orthopedics;  Laterality: Left;   COLONOSCOPY     KNEE ARTHROPLASTY Left 12/10/2020   Procedure: COMPUTER ASSISTED TOTAL KNEE ARTHROPLASTY;  Surgeon: Dereck Leep, MD;  Location: [facility];  Service: Orthopedics;  Laterality: Left;   LEFT HEART CATH AND CORONARY ANGIOGRAPHY Left 04/16/2021   Procedure: LEFT HEART CATH AND CORONARY ANGIOGRAPHY;  Surgeon: Corey Skains, MD;  Location: [place] [facility];  Service: Cardiovascular;  Laterality: Left;   MASTECTOMY Left 2002   Patient Active Problem List   Diagnosis Date Noted   Angina pectoris ([place]) 04/16/2021   IGT (impaired glucose tolerance) 12/10/2020   Neuropathy 12/10/2020   Obesity (BMI 30-39.9) 12/10/2020   Primary osteoarthritis of left knee 09/16/2020   Primary  osteoarthritis of right knee 09/16/2020   Polyarthralgia 03/26/2017   Vitamin B12 deficiency 02/01/2017   Vitamin D deficiency 02/01/2017   HTN (hypertension) 02/17/2014   Hypercholesterolemia 02/17/2014    REFERRING DIAG:  G62.9 (ICD-10-CM) - Polyneuropathy, unspecified  R26.89 (ICD-10-CM) - Other abnormalities of gait and mobility    THERAPY DIAG:  Imbalance  Difficulty in walking, not elsewhere classified  Muscle weakness (generalized)  Rationale for Evaluation and Treatment Rehabilitation  PERTINENT HISTORY: Patient is an 80 year old female referred for balance and gait deficits with Hx of longstanding bilateral peripheral neuropathy, hx of L TKA (12/10/2020) and R knee pain secondary to OA. Patient reports SOB following panic attack during her L TKA rehab. Pt uses CPAP at night now for sleep apnea. She reports small heart blockage after that episode. She reports having increased difficulty walking during this workup with cardiology. Pt reports increased fluid accumulation from 230 lbs with her weight now being 179 lbs. Patient reports difficulty with walking at this time - she uses walker when going outside usually, especially on uneven ground. Pt finds herself walking without AD in home sometimes. Hx of vascular disease and peripheral neuropathy. Pt is notably challenged with fear of falling. Hx of dyspnea on exertion    Pain: Yes, R knee; doing better following cortisone injection  Numbness/Tingling: Yes, sensory change in hands and feet from  neuropathy Focal Weakness: general LE weakness Recent changes in overall health/medication: Yes, heart blockage found  Prior history of physical therapy for balance:  No Falls: Has patient fallen in last 6 months? Yes, Number of falls: 4, pt fell when riding lawn mower when ducking under limb (pt fell backward off of lawnmower). Pt fell in home 2x when bending over to grab item from floor, pt fell forward when getting out of bed.  Directional  pattern for falls: Yes, forward falling when bending over  Imaging: Yes    Radiograph L knee 12/10/20 Status post total knee replacement with prosthetic components well-seated. No fracture or dislocation. Acute postoperative changes noted.       Prior level of function: Independent Occupational demands: Retired  Office manager: Media planner (jewelry, painting)    Red flags (bowel/bladder changes, saddle paresthesia, personal history of cancer, h/o spinal tumors, h/o compression fx, h/o abdominal aneurysm, abdominal pain, chills/fever, night sweats, nausea, vomiting, unrelenting pain): Positive for Hx of breast cancer (in remission), otherwise no other red flags    Weight Bearing Restrictions: No   Living Environment Lives with: lives alone, daughter lives close by  Lives in: House/apartment Has following equipment at home: Single point cane, Environmental consultant - 4 wheeled, and Grab bars Pt uses step-to pattern to get up 14 steps in home 3 steps to get into home (gets up those easily)  Step-in shower, no shower bench; pt has suction grab bar     Patient Goals: Able to walk better, improve balance    PRECAUTIONS: Fall history   SUBJECTIVE:                                                                                                                                                                                      SUBJECTIVE STATEMENT:  Patient reports she feels more "shaky" today. Patient reports minimal musculoskeletal pain at arrival. She reports not doing home exercises as much over Christmas time. Patient reports no other major updates at arrival.   PAIN:  Are you having pain? No   OBJECTIVE: (objective measures completed at initial evaluation unless otherwise dated)   Patient Surveys  FOTO: 71, predicted improvement to 75 ABC: 62.5%     Cognition Patient is oriented to person, place, and time.  Recent memory is intact.  Remote memory is intact.  Attention span and concentration are  intact.  Expressive speech is intact.  Patient's fund of knowledge is within normal limits for educational level.                            Observation Venous distension and varicose veins  in bilat legs, color change. No inc tissue temperature, calf girth, pitting edema, or significant calf tenderness.     GAIT: Distance walked: 45 ft Assistive device utilized: Single point cane, RUE Level of assistance: SBA Comments: Pt walks with RLE externally rotated, wide BOS, decreased gait cadence, diminished heel strike, slow velocity    Posture: Pt rests in posterior pelvic tilt. Grossly kyphotic posture. Pes planus. Genu valgus R>L knee.      LE MMT:   MMT (out of 5) Right 10/06/2022 Left 10/06/2022  Hip flexion 4 4  Hip extension      Hip abduction 5 5  Hip adduction 5 5  Hip internal rotation      Hip external rotation      Knee flexion 5 5  Knee extension 5 5  Ankle dorsiflexion 4 5  Ankle plantarflexion      Ankle inversion      Ankle eversion      (* = pain; Blank rows = not tested)     Sensation Decreased light touch sensation in bilateral toes and dorsum of feet     Reflexes Deferred     Cranial Nerves Visual acuity and visual fields are intact  Extraocular muscles are intact; pt does have difficulty tracking vertically from top of visual field back to neutral position  Facial sensation is intact bilaterally  Facial strength is intact bilaterally  Hearing is normal as tested by gross conversation Palate elevates midline, normal phonation  Shoulder shrug strength is intact  Tongue protrudes midline     Coordination/Cerebellar Finger to Nose: WNL Heel to Shin: WNL Rapid alternating movements: WNL Finger Opposition: WNL Pronator Drift: Negative     FUNCTIONAL OUTCOME MEASURES     Results Comments  BERG 10/08/22: 48/56 Fall risk, in need of intervention  TUG 10/06/22: 19.5 seconds    5TSTS 10/06/22: 17 seconds    DGI 10/08/22: 11/24    10 Meter Gait  Speed 10/08/22: Self-selected: s = 0.59 m/s; Fastest: s = 0.69 m/s Below normative values for full community ambulation  (Blank rows = not tested)       TODAY'S TREATMENT      Neuromuscular Re-education - for improved sensory integration, static and dynamic postural control, equilibrium and non-equilibrium coordination as needed for negotiating home and community environment and stepping over obstacles  In // bars:  Toe tapping, with bilat standing; alternating R/L 1x10 on 6-inch step and 1x10 on 6-inch step with Airex pad on top - for fleeting unipedal stance as needed for obstacle negotiation    -attempted with standing on Airex pad, pt had repeated posterior LOB  Standing hurdle step; 1x10 with each LE, with 6-inch hurdle on floor   -intermittent LUE touch on parallel bar for balance  Semitandem on Airex pad; 1x30 sec in each position   Airex pad standing march; 1x15 alternating  Standing in stride stance with postural perturbations, multi-directional; 2 x 1 minute    `   Tagging sticky notes on wall with ball; multi-directional reaching requiring bending/leaning; x 3 minutes    *not today*  Sit to stand with Airex under feet; additional Airex on chair to increase chair height; performed 2x10  -verbal cueing to limit pushing posterior thigh on edge of chair Functional reaching with cones; 10 cones on adjacent table; 2 trials of forward reaching and cross-body reach to place cone down with each upper limb   -for postural control with forward leaning and reaching tasks Feet together on Airex  pad; 1x30sec    Therapeutic Exercise - improved strength as needed to improve performance of CKC activities/functional movements and as needed for power production to prevent fall during episode of large postural perturbation, completion of performance-based outcome measures to detect fall risk and track progress   NuStep; x 5 minutes with Level 3 resistance - for LE strengthening and lower  limb mobility   -subjective information gathered during this time  In // bars:  Forward/backward stepping; 4x D/B length of bars; verbal cueing to limit toe-out and heel strike  Sidestepping with Red Tband just superior to bilat patellae; 4x D/B length of bars High knees 4x D/B  length of bars  Squat to butt tap on edge of chair, with Airex on chair for increased seat height; 2x10   *not today* High knees in bars; 3x D/B  Standing heel raise/toe raise; x10 alternating Standing march with light touch in single bar, in // bars; x10 alternating Sit to stand, reviewed      PATIENT EDUCATION:  Education details: see above for patient education details Person educated: Patient Education method: Explanation, Demonstration, and Handouts Education comprehension: verbalized understanding     HOME EXERCISE PROGRAM: Access Code: [email or web address] URL: [email or web address] Date: 10/12/2022 Prepared by: Valentina Gu  Exercises - Sit to Stand Without Arm Support  - 2 x daily - 7 x weekly - 2 sets - 10 reps - Heel Toe Raises with Unilateral Counter Support  - 2 x daily - 7 x weekly - 2 sets - 10 reps - Standing March with Counter Support  - 2 x daily - 7 x weekly - 2 sets - 10 reps      ASSESSMENT:   CLINICAL IMPRESSION: Patient is able to perform squat to chair without coming to complete rest such as during sit to stands - simulating demands of bending down to retrieve item from floor and being able to return to standing without reliance on external support. Seat height is increased due to comorbid R knee pain/OA. Pt is improving with static postural control performance, but she is still notably limited with obstacle and stair negotiation. Patient has remaining deficits in impaired postural control, gait changes, hip and trunk control needed to prevent forward LOB with bending over/reaching to low-lying item.  Patient will benefit from skilled PT to address above impairments and  improve overall function.   REHAB POTENTIAL: Good   CLINICAL DECISION MAKING: Evolving/moderate complexity   EVALUATION COMPLEXITY: Moderate     GOALS:   SHORT TERM GOALS: Target date: 10/30/2022   Pt will be independent with HEP in order to improve strength and balance in order to decrease fall risk and improve function at home. Baseline: 10/06/22: Baseline HEP to be initiated next visit  Goal status: INITIAL     LONG TERM GOALS: Target date: 12/04/2022   Pt will increase FOTO to at least 54 to demonstrate significant improvement in function at home related to balance  Baseline: 10/06/22: 57 Goal status: INITIAL   2.  Pt will improve BERG by at least 3 points in order to demonstrate clinically significant improvement in balance.   Baseline: 10/06/22: Will complete next visit     10/08/22: 48/56 Goal status: INITIAL   3.  Pt will improve ABC by at least 13% in order to demonstrate clinically significant improvement in balance confidence.      Baseline: 10/06/22: Will complete next visit     10/08/22: 62.5% Goal status: INITIAL   4. Pt will decrease 5TSTS  by at least 3 seconds in order to demonstrate clinically significant improvement in LE strength      Baseline: 10/06/22: 17 seconds Goal status: INITIAL   5. Pt will improve DGI by at least 3 points in order to demonstrate clinically significant improvement in balance and decreased risk for falls.     Baseline: 10/06/22: Baseline to be completed next visit     10/08/22: 11/24 Goal status: INITIAL   6. Pt will decrease TUG to below 14 seconds/decrease in order to demonstrate decreased fall risk.  Baseline: 10/06/22: 19.5 sec Goal status: INITIAL         PLAN: PT FREQUENCY: 2x/week   PT DURATION: 8-10 weeks   PLANNED INTERVENTIONS: Therapeutic exercises, Therapeutic activity, Neuromuscular re-education, Balance training, Gait training, Patient/Family education,    PLAN FOR NEXT SESSION: Continue with task practice for  stepping with sound heel strike technique, postural control training, obstacle negotiation; strategies to improve ability to reach low to floor including squatting and functional reaching.    Valentina Gu, PT, DPT #[id number]  Eilleen Kempf, PT 10/29/2022, 2:23 PM

## 2022-10-31 DIAGNOSIS — R0602 Shortness of breath: Secondary | ICD-10-CM | POA: Diagnosis not present

## 2022-10-31 DIAGNOSIS — G4733 Obstructive sleep apnea (adult) (pediatric): Secondary | ICD-10-CM | POA: Diagnosis not present

## 2022-11-03 DIAGNOSIS — G4733 Obstructive sleep apnea (adult) (pediatric): Secondary | ICD-10-CM | POA: Diagnosis not present

## 2022-11-05 ENCOUNTER — Ambulatory Visit: Payer: PPO | Attending: Nurse Practitioner | Admitting: Physical Therapy

## 2022-11-05 ENCOUNTER — Encounter: Payer: Self-pay | Admitting: Physical Therapy

## 2022-11-05 DIAGNOSIS — M6281 Muscle weakness (generalized): Secondary | ICD-10-CM | POA: Diagnosis not present

## 2022-11-05 DIAGNOSIS — R2689 Other abnormalities of gait and mobility: Secondary | ICD-10-CM | POA: Insufficient documentation

## 2022-11-05 DIAGNOSIS — R262 Difficulty in walking, not elsewhere classified: Secondary | ICD-10-CM | POA: Diagnosis not present

## 2022-11-05 NOTE — Therapy (Signed)
OUTPATIENT PHYSICAL THERAPY TREATMENT NOTE   Patient Name: Laurie Roy MRN: [id number] DOB:1941-01-22, 82 y.o., female Today's Date: 10/08/2022  PCP: Sallee Lange, NP REFERRING PROVIDER: Sallee Lange, NP  END OF SESSION:   PT End of Session - 11/05/22 1422     Visit Number 7    Number of Visits 17    Date for PT Re-Evaluation 12/01/22    Authorization Type HTA, visit limit based on medical necessity; no auth    Progress Note Due on Visit 10    PT Start Time 1417    PT Stop Time 1500    PT Time Calculation (min) 43 min    Equipment Utilized During Treatment Gait belt    Activity Tolerance Patient tolerated treatment well    Behavior During Therapy WFL for tasks assessed/performed               Past Medical History:  Diagnosis Date   Breast cancer ([place]) 1224   Complication of anesthesia    Heart murmur    Personal history of chemotherapy    Personal history of radiation therapy    PONV (postoperative nausea and vomiting)    Past Surgical History:  Procedure Laterality Date   APPLICATION OF WOUND VAC Left 12/10/2020   Procedure: APPLICATION OF PREVENA  WOUND VAC;  Surgeon: Dereck Leep, MD;  Location: [facility];  Service: Orthopedics;  Laterality: Left;   COLONOSCOPY     KNEE ARTHROPLASTY Left 12/10/2020   Procedure: COMPUTER ASSISTED TOTAL KNEE ARTHROPLASTY;  Surgeon: Dereck Leep, MD;  Location: [facility];  Service: Orthopedics;  Laterality: Left;   LEFT HEART CATH AND CORONARY ANGIOGRAPHY Left 04/16/2021   Procedure: LEFT HEART CATH AND CORONARY ANGIOGRAPHY;  Surgeon: Corey Skains, MD;  Location: [facility];  Service: Cardiovascular;  Laterality: Left;   MASTECTOMY Left 2002   Patient Active Problem List   Diagnosis Date Noted   Angina pectoris ([place]) 04/16/2021   IGT (impaired glucose tolerance) 12/10/2020   Neuropathy 12/10/2020   Obesity (BMI 30-39.9) 12/10/2020   Primary osteoarthritis of left knee 09/16/2020   Primary  osteoarthritis of right knee 09/16/2020   Polyarthralgia 03/26/2017   Vitamin B12 deficiency 02/01/2017   Vitamin D deficiency 02/01/2017   HTN (hypertension) 02/17/2014   Hypercholesterolemia 02/17/2014    REFERRING DIAG:  G62.9 (ICD-10-CM) - Polyneuropathy, unspecified  R26.89 (ICD-10-CM) - Other abnormalities of gait and mobility    THERAPY DIAG:  Imbalance  Difficulty in walking, not elsewhere classified  Muscle weakness (generalized)  Rationale for Evaluation and Treatment Rehabilitation  PERTINENT HISTORY: Patient is an 82 year old female referred for balance and gait deficits with Hx of longstanding bilateral peripheral neuropathy, hx of L TKA (12/10/2020) and R knee pain secondary to OA. Patient reports SOB following panic attack during her L TKA rehab. Pt uses CPAP at night now for sleep apnea. She reports small heart blockage after that episode. She reports having increased difficulty walking during this workup with cardiology. Pt reports increased fluid accumulation from 230 lbs with her weight now being 179 lbs. Patient reports difficulty with walking at this time - she uses walker when going outside usually, especially on uneven ground. Pt finds herself walking without AD in home sometimes. Hx of vascular disease and peripheral neuropathy. Pt is notably challenged with fear of falling. Hx of dyspnea on exertion    Pain: Yes, R knee; doing better following cortisone injection  Numbness/Tingling: Yes, sensory change in hands and feet  from neuropathy Focal Weakness: general LE weakness Recent changes in overall health/medication: Yes, heart blockage found  Prior history of physical therapy for balance:  No Falls: Has patient fallen in last 6 months? Yes, Number of falls: 4, pt fell when riding lawn mower when ducking under limb (pt fell backward off of lawnmower). Pt fell in home 2x when bending over to grab item from floor, pt fell forward when getting out of bed.  Directional  pattern for falls: Yes, forward falling when bending over  Imaging: Yes    Radiograph L knee 12/10/20 Status post total knee replacement with prosthetic components well-seated. No fracture or dislocation. Acute postoperative changes noted.       Prior level of function: Independent Occupational demands: Retired  Office manager: Media planner (jewelry, painting)    Red flags (bowel/bladder changes, saddle paresthesia, personal history of cancer, h/o spinal tumors, h/o compression fx, h/o abdominal aneurysm, abdominal pain, chills/fever, night sweats, nausea, vomiting, unrelenting pain): Positive for Hx of breast cancer (in remission), otherwise no other red flags    Weight Bearing Restrictions: No   Living Environment Lives with: lives alone, daughter lives close by  Lives in: House/apartment Has following equipment at home: Single point cane, Environmental consultant - 4 wheeled, and Grab bars Pt uses step-to pattern to get up 14 steps in home 3 steps to get into home (gets up those easily)  Step-in shower, no shower bench; pt has suction grab bar     Patient Goals: Able to walk better, improve balance    PRECAUTIONS: Fall history   SUBJECTIVE:                                                                                                                                                                                      SUBJECTIVE STATEMENT:  Patient reports no major concerns since last visit. She had good f/u with pulmonology.   PAIN:  Are you having pain? No   OBJECTIVE: (objective measures completed at initial evaluation unless otherwise dated)   Patient Surveys  FOTO: 45, predicted improvement to 25 ABC: 62.5%     Cognition Patient is oriented to person, place, and time.  Recent memory is intact.  Remote memory is intact.  Attention span and concentration are intact.  Expressive speech is intact.  Patient's fund of knowledge is within normal limits for educational level.                             Observation Venous distension and varicose veins in bilat legs, color change. No inc tissue temperature, calf girth, pitting edema, or significant calf tenderness.  GAIT: Distance walked: 45 ft Assistive device utilized: Single point cane, RUE Level of assistance: SBA Comments: Pt walks with RLE externally rotated, wide BOS, decreased gait cadence, diminished heel strike, slow velocity    Posture: Pt rests in posterior pelvic tilt. Grossly kyphotic posture. Pes planus. Genu valgus R>L knee.      LE MMT:   MMT (out of 5) Right 10/06/2022 Left 10/06/2022  Hip flexion 4 4  Hip extension      Hip abduction 5 5  Hip adduction 5 5  Hip internal rotation      Hip external rotation      Knee flexion 5 5  Knee extension 5 5  Ankle dorsiflexion 4 5  Ankle plantarflexion      Ankle inversion      Ankle eversion      (* = pain; Blank rows = not tested)     Sensation Decreased light touch sensation in bilateral toes and dorsum of feet     Reflexes Deferred     Cranial Nerves Visual acuity and visual fields are intact  Extraocular muscles are intact; pt does have difficulty tracking vertically from top of visual field back to neutral position  Facial sensation is intact bilaterally  Facial strength is intact bilaterally  Hearing is normal as tested by gross conversation Palate elevates midline, normal phonation  Shoulder shrug strength is intact  Tongue protrudes midline     Coordination/Cerebellar Finger to Nose: WNL Heel to Shin: WNL Rapid alternating movements: WNL Finger Opposition: WNL Pronator Drift: Negative     FUNCTIONAL OUTCOME MEASURES     Results Comments  BERG 10/08/22: 48/56 Fall risk, in need of intervention  TUG 10/06/22: 19.5 seconds    5TSTS 10/06/22: 17 seconds    DGI 10/08/22: 11/24    10 Meter Gait Speed 10/08/22: Self-selected: s = 0.59 m/s; Fastest: s = 0.69 m/s Below normative values for full community ambulation  (Blank rows =  not tested)       TODAY'S TREATMENT      Neuromuscular Re-education - for improved sensory integration, static and dynamic postural control, equilibrium and non-equilibrium coordination as needed for negotiating home and community environment and stepping over obstacles  In // bars:  Toe tapping, with bilat standing; alternating R/L 2x10 on 6-inch step with Airex pad on top - for fleeting unipedal stance as needed for obstacle negotiation   Standing hurdle step; 1x10 with each LE, with 5-b Dbell on floor   -no UE support  Step up/down 3 consecutive Airex pads; 2x D/B length of parallel bars  Tandem standing on floor; 2x20 sec each position (RLE in back and LLE in back)  Airex pad standing march; 2x10 alternating  Standing in stride stance with postural perturbations, multi-directional; 2 x 1 minute       *not today* Tagging sticky notes on wall with ball; multi-directional reaching requiring bending/leaning; x 3 minutes  Sit to stand with Airex under feet; additional Airex on chair to increase chair height; performed 2x10  -verbal cueing to limit pushing posterior thigh on edge of chair Functional reaching with cones; 10 cones on adjacent table; 2 trials of forward reaching and cross-body reach to place cone down with each upper limb   -for postural control with forward leaning and reaching tasks Feet together on Airex pad; 1x30sec    Therapeutic Exercise - improved strength as needed to improve performance of CKC activities/functional movements and as needed for power production to prevent fall during  episode of large postural perturbation, completion of performance-based outcome measures to detect fall risk and track progress   NuStep; x 5 minutes with Level 4 resistance - for LE strengthening and lower limb mobility   -subjective information gathered during this time  In // bars:  Forward/backward stepping; 4x D/B length of bars; verbal cueing to limit toe-out and heel  strike  Sidestepping with Green Tband just superior to bilat patellae; 5x D/B length of bars High knees 4x D/B  length of bars  Squat to butt tap on edge of chair, with Airex on chair for increased seat height; 2x10  -verbal cueing and demo to reduce weightbearing on ischial tuberosities at bottom of squat  *not today* High knees in bars; 3x D/B  Standing heel raise/toe raise; x10 alternating Standing march with light touch in single bar, in // bars; x10 alternating Sit to stand, reviewed      PATIENT EDUCATION:  Education details: see above for patient education details Person educated: Patient Education method: Explanation, Demonstration, and Handouts Education comprehension: verbalized understanding     HOME EXERCISE PROGRAM: Access Code: [email or web address] URL: [email or web address] Date: 10/12/2022 Prepared by: Valentina Gu  Exercises - Sit to Stand Without Arm Support  - 2 x daily - 7 x weekly - 2 sets - 10 reps - Heel Toe Raises with Unilateral Counter Support  - 2 x daily - 7 x weekly - 2 sets - 10 reps - Standing March with Counter Support  - 2 x daily - 7 x weekly - 2 sets - 10 reps      ASSESSMENT:   CLINICAL IMPRESSION: Patient is improving in ability to perform tandem standing and she demonstrates improved ability to respond to postural perturbations. Pt is significantly challenged with stepping up/down and negotiating obstacles. Pt reports heavy assist of BUEs on parallel bars during Airex step up/down. Pt is limited by comorbid R knee OA and has pain toward end of 2nd set of squat to chair. Pt participates well with PT and is demonstrating good faith effort to date. Pt needs continued work on Surveyor, mining and obstacle negotiation. Patient has remaining deficits in impaired postural control, gait changes, hip and trunk control needed to prevent forward LOB with bending over/reaching to low-lying item.  Patient will benefit from skilled PT to  address above impairments and improve overall function.   REHAB POTENTIAL: Good   CLINICAL DECISION MAKING: Evolving/moderate complexity   EVALUATION COMPLEXITY: Moderate     GOALS:   SHORT TERM GOALS: Target date: 10/30/2022   Pt will be independent with HEP in order to improve strength and balance in order to decrease fall risk and improve function at home. Baseline: 10/06/22: Baseline HEP to be initiated next visit  Goal status: INITIAL     LONG TERM GOALS: Target date: 12/04/2022   Pt will increase FOTO to at least 54 to demonstrate significant improvement in function at home related to balance  Baseline: 10/06/22: 57 Goal status: INITIAL   2.  Pt will improve BERG by at least 3 points in order to demonstrate clinically significant improvement in balance.   Baseline: 10/06/22: Will complete next visit     10/08/22: 48/56 Goal status: INITIAL   3.  Pt will improve ABC by at least 13% in order to demonstrate clinically significant improvement in balance confidence.      Baseline: 10/06/22: Will complete next visit     10/08/22: 62.5% Goal status: INITIAL   4. Pt will decrease 5TSTS by  at least 3 seconds in order to demonstrate clinically significant improvement in LE strength      Baseline: 10/06/22: 17 seconds Goal status: INITIAL   5. Pt will improve DGI by at least 3 points in order to demonstrate clinically significant improvement in balance and decreased risk for falls.     Baseline: 10/06/22: Baseline to be completed next visit     10/08/22: 11/24 Goal status: INITIAL   6. Pt will decrease TUG to below 14 seconds/decrease in order to demonstrate decreased fall risk.  Baseline: 10/06/22: 19.5 sec Goal status: INITIAL         PLAN: PT FREQUENCY: 2x/week   PT DURATION: 8-10 weeks   PLANNED INTERVENTIONS: Therapeutic exercises, Therapeutic activity, Neuromuscular re-education, Balance training, Gait training, Patient/Family education,    PLAN FOR NEXT SESSION: Continue  with task practice for stepping with sound heel strike technique, postural control training, obstacle negotiation; strategies to improve ability to reach low to floor including squatting and functional reaching.    Valentina Gu, PT, DPT #[id number]  Eilleen Kempf, PT 11/05/2022, 2:23 PM

## 2022-11-10 ENCOUNTER — Ambulatory Visit: Payer: PPO | Admitting: Physical Therapy

## 2022-11-10 DIAGNOSIS — R2689 Other abnormalities of gait and mobility: Secondary | ICD-10-CM | POA: Diagnosis not present

## 2022-11-10 DIAGNOSIS — R262 Difficulty in walking, not elsewhere classified: Secondary | ICD-10-CM

## 2022-11-10 DIAGNOSIS — M6281 Muscle weakness (generalized): Secondary | ICD-10-CM

## 2022-11-10 NOTE — Therapy (Incomplete)
OUTPATIENT PHYSICAL THERAPY TREATMENT NOTE   Patient Name: Laurie Roy MRN: [id number] DOB:02/26/1941, 82 y.o., female Today's Date: 10/08/2022  PCP: Sallee Lange, NP REFERRING PROVIDER: Sallee Lange, NP  END OF SESSION:   PT End of Session - 11/10/22 1456     Visit Number 8    Number of Visits 17    Date for PT Re-Evaluation 12/01/22    Authorization Type HTA, visit limit based on medical necessity; no auth    Progress Note Due on Visit 10    PT Start Time 1421    PT Stop Time 1501    PT Time Calculation (min) 40 min    Equipment Utilized During Treatment Gait belt    Activity Tolerance Patient tolerated treatment well    Behavior During Therapy WFL for tasks assessed/performed              Past Medical History:  Diagnosis Date   Breast cancer ([place]) 1027   Complication of anesthesia    Heart murmur    Personal history of chemotherapy    Personal history of radiation therapy    PONV (postoperative nausea and vomiting)    Past Surgical History:  Procedure Laterality Date   APPLICATION OF WOUND VAC Left 12/10/2020   Procedure: APPLICATION OF PREVENA  WOUND VAC;  Surgeon: Dereck Leep, MD;  Location: [facility];  Service: Orthopedics;  Laterality: Left;   COLONOSCOPY     KNEE ARTHROPLASTY Left 12/10/2020   Procedure: COMPUTER ASSISTED TOTAL KNEE ARTHROPLASTY;  Surgeon: Dereck Leep, MD;  Location: [facility];  Service: Orthopedics;  Laterality: Left;   LEFT HEART CATH AND CORONARY ANGIOGRAPHY Left 04/16/2021   Procedure: LEFT HEART CATH AND CORONARY ANGIOGRAPHY;  Surgeon: Corey Skains, MD;  Location: [facility];  Service: Cardiovascular;  Laterality: Left;   MASTECTOMY Left 2002   Patient Active Problem List   Diagnosis Date Noted   Angina pectoris ([place]) 04/16/2021   IGT (impaired glucose tolerance) 12/10/2020   Neuropathy 12/10/2020   Obesity (BMI 30-39.9) 12/10/2020   Primary osteoarthritis of left knee 09/16/2020   Primary  osteoarthritis of right knee 09/16/2020   Polyarthralgia 03/26/2017   Vitamin B12 deficiency 02/01/2017   Vitamin D deficiency 02/01/2017   HTN (hypertension) 02/17/2014   Hypercholesterolemia 02/17/2014    REFERRING DIAG:  G62.9 (ICD-10-CM) - Polyneuropathy, unspecified  R26.89 (ICD-10-CM) - Other abnormalities of gait and mobility    THERAPY DIAG:  Imbalance  Difficulty in walking, not elsewhere classified  Muscle weakness (generalized)  Rationale for Evaluation and Treatment Rehabilitation  PERTINENT HISTORY: Patient is an 82 year old female referred for balance and gait deficits with Hx of longstanding bilateral peripheral neuropathy, hx of L TKA (12/10/2020) and R knee pain secondary to OA. Patient reports SOB following panic attack during her L TKA rehab. Pt uses CPAP at night now for sleep apnea. She reports small heart blockage after that episode. She reports having increased difficulty walking during this workup with cardiology. Pt reports increased fluid accumulation from 230 lbs with her weight now being 179 lbs. Patient reports difficulty with walking at this time - she uses walker when going outside usually, especially on uneven ground. Pt finds herself walking without AD in home sometimes. Hx of vascular disease and peripheral neuropathy. Pt is notably challenged with fear of falling. Hx of dyspnea on exertion    Pain: Yes, R knee; doing better following cortisone injection  Numbness/Tingling: Yes, sensory change in hands and feet from  neuropathy Focal Weakness: general LE weakness Recent changes in overall health/medication: Yes, heart blockage found  Prior history of physical therapy for balance:  No Falls: Has patient fallen in last 6 months? Yes, Number of falls: 4, pt fell when riding lawn mower when ducking under limb (pt fell backward off of lawnmower). Pt fell in home 2x when bending over to grab item from floor, pt fell forward when getting out of bed.  Directional  pattern for falls: Yes, forward falling when bending over  Imaging: Yes    Radiograph L knee 12/10/20 Status post total knee replacement with prosthetic components well-seated. No fracture or dislocation. Acute postoperative changes noted.       Prior level of function: Independent Occupational demands: Retired  Office manager: Media planner (jewelry, painting)    Red flags (bowel/bladder changes, saddle paresthesia, personal history of cancer, h/o spinal tumors, h/o compression fx, h/o abdominal aneurysm, abdominal pain, chills/fever, night sweats, nausea, vomiting, unrelenting pain): Positive for Hx of breast cancer (in remission), otherwise no other red flags    Weight Bearing Restrictions: No   Living Environment Lives with: lives alone, daughter lives close by  Lives in: House/apartment Has following equipment at home: Single point cane, Environmental consultant - 4 wheeled, and Grab bars Pt uses step-to pattern to get up 14 steps in home 3 steps to get into home (gets up those easily)  Step-in shower, no shower bench; pt has suction grab bar     Patient Goals: Able to walk better, improve balance    PRECAUTIONS: Fall history   SUBJECTIVE:                                                                                                                                                                                      SUBJECTIVE STATEMENT:  Patient reports no major concerns since last visit and no recent incidents/near falls. She reports feeling very stiff today and having more general achiness and R knee pain with recent cold weather.    PAIN:  Are you having pain? No   OBJECTIVE: (objective measures completed at initial evaluation unless otherwise dated)   Patient Surveys  FOTO: 68, predicted improvement to 47 ABC: 62.5%     Cognition Patient is oriented to person, place, and time.  Recent memory is intact.  Remote memory is intact.  Attention span and concentration are intact.   Expressive speech is intact.  Patient's fund of knowledge is within normal limits for educational level.                            Observation Venous distension and varicose veins  in bilat legs, color change. No inc tissue temperature, calf girth, pitting edema, or significant calf tenderness.     GAIT: Distance walked: 45 ft Assistive device utilized: Single point cane, RUE Level of assistance: SBA Comments: Pt walks with RLE externally rotated, wide BOS, decreased gait cadence, diminished heel strike, slow velocity    Posture: Pt rests in posterior pelvic tilt. Grossly kyphotic posture. Pes planus. Genu valgus R>L knee.      LE MMT:   MMT (out of 5) Right 10/06/2022 Left 10/06/2022  Hip flexion 4 4  Hip extension      Hip abduction 5 5  Hip adduction 5 5  Hip internal rotation      Hip external rotation      Knee flexion 5 5  Knee extension 5 5  Ankle dorsiflexion 4 5  Ankle plantarflexion      Ankle inversion      Ankle eversion      (* = pain; Blank rows = not tested)     Sensation Decreased light touch sensation in bilateral toes and dorsum of feet     Reflexes Deferred     Cranial Nerves Visual acuity and visual fields are intact  Extraocular muscles are intact; pt does have difficulty tracking vertically from top of visual field back to neutral position  Facial sensation is intact bilaterally  Facial strength is intact bilaterally  Hearing is normal as tested by gross conversation Palate elevates midline, normal phonation  Shoulder shrug strength is intact  Tongue protrudes midline     Coordination/Cerebellar Finger to Nose: WNL Heel to Shin: WNL Rapid alternating movements: WNL Finger Opposition: WNL Pronator Drift: Negative     FUNCTIONAL OUTCOME MEASURES     Results Comments  BERG 10/08/22: 48/56 Fall risk, in need of intervention  TUG 10/06/22: 19.5 seconds    5TSTS 10/06/22: 17 seconds    DGI 10/08/22: 11/24    10 Meter Gait Speed  10/08/22: Self-selected: s = 0.59 m/s; Fastest: s = 0.69 m/s Below normative values for full community ambulation  (Blank rows = not tested)       TODAY'S TREATMENT      Neuromuscular Re-education - for improved sensory integration, static and dynamic postural control, equilibrium and non-equilibrium coordination as needed for negotiating home and community environment and stepping over obstacles  In // bars:  Toe tapping, with bilat standing; alternating R/L 2x10 on 6-inch step with Airex pad on top - for fleeting unipedal stance as needed for obstacle negotiation   Standing hurdle step; 1x10 with each LE, with yard stick for visual target   -no UE support  Tandem standing on floor; 2x20 sec each position (RLE in back and LLE in back)  Airex pad standing march; 2x10 alternating  Standing in stride stance with postural perturbations, multi-directional; 2 x 1 minute      Forward stepping over hurdles, 3 6-inch hurdles; 4x D/B  *not today* Step up/down 3 consecutive Airex pads; 2x D/B length of parallel bars Tagging sticky notes on wall with ball; multi-directional reaching requiring bending/leaning; x 3 minutes  Sit to stand with Airex under feet; additional Airex on chair to increase chair height; performed 2x10  -verbal cueing to limit pushing posterior thigh on edge of chair Functional reaching with cones; 10 cones on adjacent table; 2 trials of forward reaching and cross-body reach to place cone down with each upper limb   -for postural control with forward leaning and reaching tasks Feet together on  Airex pad; 1x30sec    Therapeutic Exercise - improved strength as needed to improve performance of CKC activities/functional movements and as needed for power production to prevent fall during episode of large postural perturbation, completion of performance-based outcome measures to detect fall risk and track progress   NuStep; x 5 minutes with Level 4 resistance - for LE  strengthening and lower limb mobility   -subjective information gathered during this time intermittently, 2 minutes not billed   In // bars:  Forward/backward stepping; 5x D/B length of bars; verbal cueing to limit toe-out and heel strike  Sidestepping with Green Tband just superior to bilat patellae; 5x D/B length of bars High knees 5x D/B  length of bars  Squat to butt tap on edge of chair, with Airex on chair for increased seat height; 2x10  -verbal cueing and demo to reduce weightbearing on ischial tuberosities at bottom of squat  *not today* High knees in bars; 3x D/B  Standing heel raise/toe raise; x10 alternating Standing march with light touch in single bar, in // bars; x10 alternating Sit to stand, reviewed      PATIENT EDUCATION:  Education details: see above for patient education details Person educated: Patient Education method: Explanation, Demonstration, and Handouts Education comprehension: verbalized understanding     HOME EXERCISE PROGRAM: Access Code: [email or web address] URL: [email or web address] Date: 10/12/2022 Prepared by: Valentina Gu  Exercises - Sit to Stand Without Arm Support  - 2 x daily - 7 x weekly - 2 sets - 10 reps - Heel Toe Raises with Unilateral Counter Support  - 2 x daily - 7 x weekly - 2 sets - 10 reps - Standing March with Counter Support  - 2 x daily - 7 x weekly - 2 sets - 10 reps      ASSESSMENT:   CLINICAL IMPRESSION: Patient is notably challenged with hurdle step and has difficulty maintaining postural control with transition from heel strike to foot flat. She is making good progress with static postural control work and is able to maintain full Tandem standing up to 20 seconds. Pt is notably challenged with negotiating obstacles and tasks requiring higher degree of combined hip/knee flexion for toe clearance. Patient has remaining deficits in impaired postural control, gait changes, hip and trunk control needed to prevent  forward LOB with bending over/reaching to low-lying item.  Patient will benefit from skilled PT to address above impairments and improve overall function.   REHAB POTENTIAL: Good   CLINICAL DECISION MAKING: Evolving/moderate complexity   EVALUATION COMPLEXITY: Moderate     GOALS:   SHORT TERM GOALS: Target date: 10/30/2022   Pt will be independent with HEP in order to improve strength and balance in order to decrease fall risk and improve function at home. Baseline: 10/06/22: Baseline HEP to be initiated next visit  Goal status: INITIAL     LONG TERM GOALS: Target date: 12/04/2022   Pt will increase FOTO to at least 54 to demonstrate significant improvement in function at home related to balance  Baseline: 10/06/22: 57 Goal status: INITIAL   2.  Pt will improve BERG by at least 3 points in order to demonstrate clinically significant improvement in balance.   Baseline: 10/06/22: Will complete next visit     10/08/22: 48/56 Goal status: INITIAL   3.  Pt will improve ABC by at least 13% in order to demonstrate clinically significant improvement in balance confidence.      Baseline: 10/06/22: Will complete next visit  10/08/22: 62.5% Goal status: INITIAL   4. Pt will decrease 5TSTS by at least 3 seconds in order to demonstrate clinically significant improvement in LE strength      Baseline: 10/06/22: 17 seconds Goal status: INITIAL   5. Pt will improve DGI by at least 3 points in order to demonstrate clinically significant improvement in balance and decreased risk for falls.     Baseline: 10/06/22: Baseline to be completed next visit     10/08/22: 11/24 Goal status: INITIAL   6. Pt will decrease TUG to below 14 seconds/decrease in order to demonstrate decreased fall risk.  Baseline: 10/06/22: 19.5 sec Goal status: INITIAL         PLAN: PT FREQUENCY: 2x/week   PT DURATION: 8-10 weeks   PLANNED INTERVENTIONS: Therapeutic exercises, Therapeutic activity, Neuromuscular  re-education, Balance training, Gait training, Patient/Family education,    PLAN FOR NEXT SESSION: Continue with task practice for stepping with sound heel strike technique, postural control training, obstacle negotiation; strategies to improve ability to reach low to floor including squatting and functional reaching.    Valentina Gu, PT, DPT #[id number]  Eilleen Kempf, PT 11/10/2022, 2:56 PM

## 2022-11-12 ENCOUNTER — Encounter: Payer: Self-pay | Admitting: Physical Therapy

## 2022-11-12 ENCOUNTER — Ambulatory Visit: Payer: PPO | Admitting: Physical Therapy

## 2022-11-12 DIAGNOSIS — R2689 Other abnormalities of gait and mobility: Secondary | ICD-10-CM

## 2022-11-12 DIAGNOSIS — M6281 Muscle weakness (generalized): Secondary | ICD-10-CM

## 2022-11-12 DIAGNOSIS — R262 Difficulty in walking, not elsewhere classified: Secondary | ICD-10-CM

## 2022-11-12 NOTE — Therapy (Signed)
OUTPATIENT PHYSICAL THERAPY TREATMENT NOTE   Patient Name: Laurie Roy MRN: [id number] DOB:Oct 15, 1941, 82 y.o., female Today's Date: 11/12/22  PCP: Sallee Lange, NP REFERRING PROVIDER: Sallee Lange, NP  END OF SESSION:   PT End of Session - 11/17/22 1445     Visit Number 9    Number of Visits 17    Date for PT Re-Evaluation 12/01/22    Progress Note Due on Visit 10    PT Start Time 1416    PT Stop Time 1502    PT Time Calculation (min) 46 min    Equipment Utilized During Treatment Gait belt    Activity Tolerance Patient tolerated treatment well    Behavior During Therapy WFL for tasks assessed/performed               Past Medical History:  Diagnosis Date   Breast cancer ([place]) 6568   Complication of anesthesia    Heart murmur    Personal history of chemotherapy    Personal history of radiation therapy    PONV (postoperative nausea and vomiting)    Past Surgical History:  Procedure Laterality Date   APPLICATION OF WOUND VAC Left 12/10/2020   Procedure: APPLICATION OF PREVENA  WOUND VAC;  Surgeon: Dereck Leep, MD;  Location: [facility];  Service: Orthopedics;  Laterality: Left;   COLONOSCOPY     KNEE ARTHROPLASTY Left 12/10/2020   Procedure: COMPUTER ASSISTED TOTAL KNEE ARTHROPLASTY;  Surgeon: Dereck Leep, MD;  Location: [facility];  Service: Orthopedics;  Laterality: Left;   LEFT HEART CATH AND CORONARY ANGIOGRAPHY Left 04/16/2021   Procedure: LEFT HEART CATH AND CORONARY ANGIOGRAPHY;  Surgeon: Corey Skains, MD;  Location: [facility];  Service: Cardiovascular;  Laterality: Left;   MASTECTOMY Left 2002   Patient Active Problem List   Diagnosis Date Noted   Angina pectoris ([place]) 04/16/2021   IGT (impaired glucose tolerance) 12/10/2020   Neuropathy 12/10/2020   Obesity (BMI 30-39.9) 12/10/2020   Primary osteoarthritis of left knee 09/16/2020   Primary osteoarthritis of right knee 09/16/2020   Polyarthralgia 03/26/2017   Vitamin  B12 deficiency 02/01/2017   Vitamin D deficiency 02/01/2017   HTN (hypertension) 02/17/2014   Hypercholesterolemia 02/17/2014    REFERRING DIAG:  G62.9 (ICD-10-CM) - Polyneuropathy, unspecified  R26.89 (ICD-10-CM) - Other abnormalities of gait and mobility    THERAPY DIAG:  Imbalance  Difficulty in walking, not elsewhere classified  Muscle weakness (generalized)  Rationale for Evaluation and Treatment Rehabilitation  PERTINENT HISTORY: Patient is an 82 year old female referred for balance and gait deficits with Hx of longstanding bilateral peripheral neuropathy, hx of L TKA (12/10/2020) and R knee pain secondary to OA. Patient reports SOB following panic attack during her L TKA rehab. Pt uses CPAP at night now for sleep apnea. She reports small heart blockage after that episode. She reports having increased difficulty walking during this workup with cardiology. Pt reports increased fluid accumulation from 230 lbs with her weight now being 179 lbs. Patient reports difficulty with walking at this time - she uses walker when going outside usually, especially on uneven ground. Pt finds herself walking without AD in home sometimes. Hx of vascular disease and peripheral neuropathy. Pt is notably challenged with fear of falling. Hx of dyspnea on exertion    Pain: Yes, R knee; doing better following cortisone injection  Numbness/Tingling: Yes, sensory change in hands and feet from neuropathy Focal Weakness: general LE weakness Recent changes in overall health/medication: Yes, heart  blockage found  Prior history of physical therapy for balance:  No Falls: Has patient fallen in last 6 months? Yes, Number of falls: 4, pt fell when riding lawn mower when ducking under limb (pt fell backward off of lawnmower). Pt fell in home 2x when bending over to grab item from floor, pt fell forward when getting out of bed.  Directional pattern for falls: Yes, forward falling when bending over  Imaging: Yes     Radiograph L knee 12/10/20 Status post total knee replacement with prosthetic components well-seated. No fracture or dislocation. Acute postoperative changes noted.       Prior level of function: Independent Occupational demands: Retired  Office manager: Media planner (jewelry, painting)    Red flags (bowel/bladder changes, saddle paresthesia, personal history of cancer, h/o spinal tumors, h/o compression fx, h/o abdominal aneurysm, abdominal pain, chills/fever, night sweats, nausea, vomiting, unrelenting pain): Positive for Hx of breast cancer (in remission), otherwise no other red flags    Weight Bearing Restrictions: No   Living Environment Lives with: lives alone, daughter lives close by  Lives in: House/apartment Has following equipment at home: Single point cane, Environmental consultant - 4 wheeled, and Grab bars Pt uses step-to pattern to get up 14 steps in home 3 steps to get into home (gets up those easily)  Step-in shower, no shower bench; pt has suction grab bar     Patient Goals: Able to walk better, improve balance    PRECAUTIONS: Fall history   SUBJECTIVE:    Patient states increased numbness in feet bilaterally and is taking Gabapetin and does not seem to be working as well. Patient reports increased sensation as visit continues. She reports general achiness and R knee pain.                                                                                                                                                                                   SUBJECTIVE STATEMENT:  Patient reports no major concerns since last visit and no recent incidents/near falls. She reports feeling very stiff today and having more general achiness and R knee pain with recent cold weather.    PAIN:  Are you having pain? No   OBJECTIVE: (objective measures completed at initial evaluation unless otherwise dated)   Patient Surveys  FOTO: 52, predicted improvement to 41 ABC: 62.5%     Cognition Patient is  oriented to person, place, and time.  Recent memory is intact.  Remote memory is intact.  Attention span and concentration are intact.  Expressive speech is intact.  Patient's fund of knowledge is within normal limits for educational level.  Observation Venous distension and varicose veins in bilat legs, color change. No inc tissue temperature, calf girth, pitting edema, or significant calf tenderness.     GAIT: Distance walked: 45 ft Assistive device utilized: Single point cane, RUE Level of assistance: SBA Comments: Pt walks with RLE externally rotated, wide BOS, decreased gait cadence, diminished heel strike, slow velocity    Posture: Pt rests in posterior pelvic tilt. Grossly kyphotic posture. Pes planus. Genu valgus R>L knee.      LE MMT:   MMT (out of 5) Right 10/06/2022 Left 10/06/2022  Hip flexion 4 4  Hip extension      Hip abduction 5 5  Hip adduction 5 5  Hip internal rotation      Hip external rotation      Knee flexion 5 5  Knee extension 5 5  Ankle dorsiflexion 4 5  Ankle plantarflexion      Ankle inversion      Ankle eversion      (* = pain; Blank rows = not tested)     Sensation Decreased light touch sensation in bilateral toes and dorsum of feet     Reflexes Deferred     Cranial Nerves Visual acuity and visual fields are intact  Extraocular muscles are intact; pt does have difficulty tracking vertically from top of visual field back to neutral position  Facial sensation is intact bilaterally  Facial strength is intact bilaterally  Hearing is normal as tested by gross conversation Palate elevates midline, normal phonation  Shoulder shrug strength is intact  Tongue protrudes midline     Coordination/Cerebellar Finger to Nose: WNL Heel to Shin: WNL Rapid alternating movements: WNL Finger Opposition: WNL Pronator Drift: Negative     FUNCTIONAL OUTCOME MEASURES     Results Comments  BERG 10/08/22: 48/56 Fall  risk, in need of intervention  TUG 10/06/22: 19.5 seconds    5TSTS 10/06/22: 17 seconds    DGI 10/08/22: 11/24    10 Meter Gait Speed 10/08/22: Self-selected: s = 0.59 m/s; Fastest: s = 0.69 m/s Below normative values for full community ambulation  (Blank rows = not tested)       TODAY'S TREATMENT      Neuromuscular Re-education - for improved sensory integration, static and dynamic postural control, equilibrium and non-equilibrium coordination as needed for negotiating home and community environment and stepping over obstacles  In // bars:  Toe tapping, with bilat standing; alternating R/L 2x10 on 6-inch step with Airex pad on top - for fleeting unipedal stance as needed for obstacle negotiation   Standing feet together on Airex; 2x30 sec  Standing single hurdle step; 2x10 with each LE, with yard stick for visual target   -no UE support  Balloon tapping for dynamic postural control and external perturbation; x 3 minutes Tagging sticky notes on wall with ball; multi-directional reaching requiring bending/leaning; x 3 minutes     *not today* Forward stepping over hurdles, 3 6-inch hurdles; 4x D/B Tandem standing on floor; 2x20 sec each position (RLE in back and LLE in back)  Airex pad standing march; 2x10 alternating  Standing in stride stance with postural perturbations, multi-directional; 2 x 1 minute  Step up/down 3 consecutive Airex pads; 2x D/B length of parallel bars   Sit to stand with Airex under feet; additional Airex on chair to increase chair height; performed 2x10  -verbal cueing to limit pushing posterior thigh on edge of chair Functional reaching with cones; 10 cones on adjacent table; 2 trials of  forward reaching and cross-body reach to place cone down with each upper limb   -for postural control with forward leaning and reaching tasks    Therapeutic Exercise - improved strength as needed to improve performance of CKC activities/functional movements and as needed  for power production to prevent fall during episode of large postural perturbation, completion of performance-based outcome measures to detect fall risk and track progress   NuStep; x 5 minutes with Level 4 resistance, seat at 8 - for LE strengthening and lower limb mobility   -subjective information gathered during this time  In // bars:  Forward/backward stepping; 5x D/B length of bars; verbal cueing to limit toe-out and heel strike Squat to butt tap on edge of chair, with Airex on chair for increased seat height; 2x10  -verbal cueing and demo to reduce weightbearing on ischial tuberosities at bottom of squat  *not today* High knees in bars; 3x D/B  Standing heel raise/toe raise; x10 alternating Standing march with light touch in single bar, in // bars; x10 alternating Sit to stand, reviewed Sidestepping with Green Tband just superior to bilat patellae; 5x D/B length of bars High knees 5x D/B  length of bars     PATIENT EDUCATION:  Education details: see above for patient education details Person educated: Patient Education method: Explanation, Demonstration, and Handouts Education comprehension: verbalized understanding     HOME EXERCISE PROGRAM: Access Code: [email or web address] URL: [email or web address] Date: 10/12/2022 Prepared by: Valentina Gu  Exercises - Sit to Stand Without Arm Support  - 2 x daily - 7 x weekly - 2 sets - 10 reps - Heel Toe Raises with Unilateral Counter Support  - 2 x daily - 7 x weekly - 2 sets - 10 reps - Standing March with Counter Support  - 2 x daily - 7 x weekly - 2 sets - 10 reps      ASSESSMENT:   CLINICAL IMPRESSION: Patient is minimally challenged with balloon tapping drill today. She demonstrates improved ability to maintain static postural control with varying BOS. Patient is making good progress to date with ability to perform dynamic balance tasks and obstacle negotiation. Patient has remaining deficits in impaired postural  control, gait changes, hip and trunk control needed to prevent forward LOB with bending over/reaching to low-lying item. Patient will benefit from skilled PT to address above impairments and improve overall function.   REHAB POTENTIAL: Good   CLINICAL DECISION MAKING: Evolving/moderate complexity   EVALUATION COMPLEXITY: Moderate     GOALS:   SHORT TERM GOALS: Target date: 10/30/2022   Pt will be independent with HEP in order to improve strength and balance in order to decrease fall risk and improve function at home. Baseline: 10/06/22: Baseline HEP to be initiated next visit  Goal status: INITIAL     LONG TERM GOALS: Target date: 12/04/2022   Pt will increase FOTO to at least 54 to demonstrate significant improvement in function at home related to balance  Baseline: 10/06/22: 57 Goal status: INITIAL   2.  Pt will improve BERG by at least 3 points in order to demonstrate clinically significant improvement in balance.   Baseline: 10/06/22: Will complete next visit     10/08/22: 48/56 Goal status: INITIAL   3.  Pt will improve ABC by at least 13% in order to demonstrate clinically significant improvement in balance confidence.      Baseline: 10/06/22: Will complete next visit     10/08/22: 62.5% Goal status: INITIAL   4. Pt will  decrease 5TSTS by at least 3 seconds in order to demonstrate clinically significant improvement in LE strength      Baseline: 10/06/22: 17 seconds Goal status: INITIAL   5. Pt will improve DGI by at least 3 points in order to demonstrate clinically significant improvement in balance and decreased risk for falls.     Baseline: 10/06/22: Baseline to be completed next visit     10/08/22: 11/24 Goal status: INITIAL   6. Pt will decrease TUG to below 14 seconds/decrease in order to demonstrate decreased fall risk.  Baseline: 10/06/22: 19.5 sec Goal status: INITIAL         PLAN: PT FREQUENCY: 2x/week   PT DURATION: 8-10 weeks   PLANNED INTERVENTIONS:  Therapeutic exercises, Therapeutic activity, Neuromuscular re-education, Balance training, Gait training, Patient/Family education,    PLAN FOR NEXT SESSION: Continue with task practice for stepping with sound heel strike technique, postural control training, obstacle negotiation; strategies to improve ability to reach low to floor including squatting and functional reaching.    Valentina Gu, PT, DPT #[id number]  Eilleen Kempf, PT 11/17/2022, 3:06 PM

## 2022-11-17 ENCOUNTER — Ambulatory Visit: Payer: PPO | Admitting: Physical Therapy

## 2022-11-17 DIAGNOSIS — R262 Difficulty in walking, not elsewhere classified: Secondary | ICD-10-CM

## 2022-11-17 DIAGNOSIS — R2689 Other abnormalities of gait and mobility: Secondary | ICD-10-CM | POA: Diagnosis not present

## 2022-11-17 DIAGNOSIS — M6281 Muscle weakness (generalized): Secondary | ICD-10-CM

## 2022-11-17 NOTE — Therapy (Signed)
OUTPATIENT PHYSICAL THERAPY TREATMENT AND PROGRESS NOTE   Dates of reporting period  10/06/22   to   11/17/22    Patient Name: Laurie Roy MRN: [id number] DOB:1940/12/09, 82 y.o., female Today's Date: 11/12/22  PCP: Sallee Lange, NP REFERRING PROVIDER: Sallee Lange, NP  END OF SESSION:   PT End of Session - 11/17/22 1614     Visit Number 10    Number of Visits 17    Date for PT Re-Evaluation 01/01/23    Progress Note Due on Visit 10    PT Start Time 1503    PT Stop Time 1555    PT Time Calculation (min) 52 min    Equipment Utilized During Treatment Gait belt    Activity Tolerance Patient tolerated treatment well    Behavior During Therapy WFL for tasks assessed/performed                Past Medical History:  Diagnosis Date   Breast cancer ([place]) 4332   Complication of anesthesia    Heart murmur    Personal history of chemotherapy    Personal history of radiation therapy    PONV (postoperative nausea and vomiting)    Past Surgical History:  Procedure Laterality Date   APPLICATION OF WOUND VAC Left 12/10/2020   Procedure: APPLICATION OF PREVENA  WOUND VAC;  Surgeon: Dereck Leep, MD;  Location: [facility];  Service: Orthopedics;  Laterality: Left;   COLONOSCOPY     KNEE ARTHROPLASTY Left 12/10/2020   Procedure: COMPUTER ASSISTED TOTAL KNEE ARTHROPLASTY;  Surgeon: Dereck Leep, MD;  Location: [facility];  Service: Orthopedics;  Laterality: Left;   LEFT HEART CATH AND CORONARY ANGIOGRAPHY Left 04/16/2021   Procedure: LEFT HEART CATH AND CORONARY ANGIOGRAPHY;  Surgeon: Corey Skains, MD;  Location: [place] [facility];  Service: Cardiovascular;  Laterality: Left;   MASTECTOMY Left 2002   Patient Active Problem List   Diagnosis Date Noted   Angina pectoris ([place]) 04/16/2021   IGT (impaired glucose tolerance) 12/10/2020   Neuropathy 12/10/2020   Obesity (BMI 30-39.9) 12/10/2020   Primary osteoarthritis of left knee 09/16/2020   Primary  osteoarthritis of right knee 09/16/2020   Polyarthralgia 03/26/2017   Vitamin B12 deficiency 02/01/2017   Vitamin D deficiency 02/01/2017   HTN (hypertension) 02/17/2014   Hypercholesterolemia 02/17/2014    REFERRING DIAG:  G62.9 (ICD-10-CM) - Polyneuropathy, unspecified  R26.89 (ICD-10-CM) - Other abnormalities of gait and mobility    THERAPY DIAG:  No diagnosis found.  Rationale for Evaluation and Treatment Rehabilitation  PERTINENT HISTORY: Patient is an 82 year old female referred for balance and gait deficits with Hx of longstanding bilateral peripheral neuropathy, hx of L TKA (12/10/2020) and R knee pain secondary to OA. Patient reports SOB following panic attack during her L TKA rehab. Pt uses CPAP at night now for sleep apnea. She reports small heart blockage after that episode. She reports having increased difficulty walking during this workup with cardiology. Pt reports increased fluid accumulation from 230 lbs with her weight now being 179 lbs. Patient reports difficulty with walking at this time - she uses walker when going outside usually, especially on uneven ground. Pt finds herself walking without AD in home sometimes. Hx of vascular disease and peripheral neuropathy. Pt is notably challenged with fear of falling. Hx of dyspnea on exertion    Pain: Yes, R knee; doing better following cortisone injection  Numbness/Tingling: Yes, sensory change in hands and feet from neuropathy Focal Weakness: general  LE weakness Recent changes in overall health/medication: Yes, heart blockage found  Prior history of physical therapy for balance:  No Falls: Has patient fallen in last 6 months? Yes, Number of falls: 4, pt fell when riding lawn mower when ducking under limb (pt fell backward off of lawnmower). Pt fell in home 2x when bending over to grab item from floor, pt fell forward when getting out of bed.  Directional pattern for falls: Yes, forward falling when bending over  Imaging: Yes     Radiograph L knee 12/10/20 Status post total knee replacement with prosthetic components well-seated. No fracture or dislocation. Acute postoperative changes noted.       Prior level of function: Independent Occupational demands: Retired  Office manager: Media planner (jewelry, painting)    Red flags (bowel/bladder changes, saddle paresthesia, personal history of cancer, h/o spinal tumors, h/o compression fx, h/o abdominal aneurysm, abdominal pain, chills/fever, night sweats, nausea, vomiting, unrelenting pain): Positive for Hx of breast cancer (in remission), otherwise no other red flags    Weight Bearing Restrictions: No   Living Environment Lives with: lives alone, daughter lives close by  Lives in: House/apartment Has following equipment at home: Single point cane, Environmental consultant - 4 wheeled, and Grab bars Pt uses step-to pattern to get up 14 steps in home 3 steps to get into home (gets up those easily)  Step-in shower, no shower bench; pt has suction grab bar     Patient Goals: Able to walk better, improve balance    PRECAUTIONS: Fall history                                                                                                                                                                   SUBJECTIVE STATEMENT:  Patient reports no major changes at arrival to PT. Patient reports feeling more achy with cold/inclement weather. Patient reports good progress to date and states she feels more confident with getting around her home; she feels she is able to rely less on her AD and finds herself leaving it in one of the rooms in her home sometimes. She reports compliance with her HEP.    PAIN:  Are you having pain? No   OBJECTIVE: (objective measures completed at initial evaluation unless otherwise dated)   Patient Surveys  FOTO: 56, predicted improvement to 39 ABC: 62.5%     Cognition Patient is oriented to person, place, and time.  Recent memory is intact.  Remote memory is  intact.  Attention span and concentration are intact.  Expressive speech is intact.  Patient's fund of knowledge is within normal limits for educational level.  Observation Venous distension and varicose veins in bilat legs, color change. No inc tissue temperature, calf girth, pitting edema, or significant calf tenderness.     GAIT: Distance walked: 45 ft Assistive device utilized: Single point cane, RUE Level of assistance: SBA Comments: Pt walks with RLE externally rotated, wide BOS, decreased gait cadence, diminished heel strike, slow velocity    Posture: Pt rests in posterior pelvic tilt. Grossly kyphotic posture. Pes planus. Genu valgus R>L knee.      LE MMT:   MMT (out of 5) Right 10/06/2022 Left 10/06/2022  Hip flexion 4 4  Hip extension      Hip abduction 5 5  Hip adduction 5 5  Hip internal rotation      Hip external rotation      Knee flexion 5 5  Knee extension 5 5  Ankle dorsiflexion 4 5  Ankle plantarflexion      Ankle inversion      Ankle eversion      (* = pain; Blank rows = not tested)     Sensation Decreased light touch sensation in bilateral toes and dorsum of feet     Reflexes Deferred     Cranial Nerves Visual acuity and visual fields are intact  Extraocular muscles are intact; pt does have difficulty tracking vertically from top of visual field back to neutral position  Facial sensation is intact bilaterally  Facial strength is intact bilaterally  Hearing is normal as tested by gross conversation Palate elevates midline, normal phonation  Shoulder shrug strength is intact  Tongue protrudes midline     Coordination/Cerebellar Finger to Nose: WNL Heel to Shin: WNL Rapid alternating movements: WNL Finger Opposition: WNL Pronator Drift: Negative     FUNCTIONAL OUTCOME MEASURES     Results Comments  BERG 10/08/22: 48/56 Fall risk, in need of intervention  TUG 10/06/22: 19.5 seconds     5TSTS 10/06/22: 17  seconds    DGI 10/08/22: 11/24    10 Meter Gait Speed 10/08/22: Self-selected: s = 0.59 m/s; Fastest: s = 0.69 m/s Below normative values for full community ambulation  (Blank rows = not tested)      [facility] PT Assessment - 11/17/22 0001       Berg Balance Test   Sit to Stand Able to stand without using hands and stabilize independently    Standing Unsupported Able to stand safely 2 minutes    Sitting with Back Unsupported but Feet Supported on Floor or Stool Able to sit safely and securely 2 minutes    Stand to Sit Sits safely with minimal use of hands    Transfers Able to transfer safely, minor use of hands    Standing Unsupported with Eyes Closed Able to stand 10 seconds safely    Standing Unsupported with Feet Together Able to place feet together independently and stand 1 minute safely    From Standing, Reach Forward with Outstretched Arm Can reach forward >12 cm safely (5")    From Standing Position, Pick up Object from Floor Able to pick up shoe safely and easily    From Standing Position, Turn to Look Behind Over each Shoulder Looks behind from both sides and weight shifts well    Turn 360 Degrees Able to turn 360 degrees safely one side only in 4 seconds or less    Standing Unsupported, Alternately Place Feet on Step/Stool Able to complete >2 steps/needs minimal assist    Standing Unsupported, One Foot in Front Able to plae foot  ahead of the other independently and hold 30 seconds    Standing on One Leg Able to lift leg independently and hold equal to or more than 3 seconds    Total Score 48               TODAY'S TREATMENT      Neuromuscular Re-education - for improved sensory integration, static and dynamic postural control, equilibrium and non-equilibrium coordination as needed for negotiating home and community environment and stepping over obstacles  *next visit* In // bars:  Toe tapping, with bilat standing; alternating R/L 2x10 on 6-inch step with Airex pad on top -  for fleeting unipedal stance as needed for obstacle negotiation   Standing feet together on Airex; 2x30 sec  Standing single hurdle step; 2x10 with each LE, with yard stick for visual target   -no UE support   Tagging sticky notes on wall with ball; multi-directional reaching requiring bending/leaning; x 3 minutes   *not today* Balloon tapping for dynamic postural control and external perturbation; x 3 minutes Forward stepping over hurdles, 3 6-inch hurdles; 4x D/B Tandem standing on floor; 2x20 sec each position (RLE in back and LLE in back)  Airex pad standing march; 2x10 alternating  Standing in stride stance with postural perturbations, multi-directional; 2 x 1 minute  Step up/down 3 consecutive Airex pads; 2x D/B length of parallel bars  Sit to stand with Airex under feet; additional Airex on chair to increase chair height; performed 2x10  -verbal cueing to limit pushing posterior thigh on edge of chair Functional reaching with cones; 10 cones on adjacent table; 2 trials of forward reaching and cross-body reach to place cone down with each upper limb   -for postural control with forward leaning and reaching tasks    Therapeutic Exercise - improved strength as needed to improve performance of CKC activities/functional movements and as needed for power production to prevent fall during episode of large postural perturbation, completion of performance-based outcome measures to detect fall risk and track progress    GOAL UPDATE PERFORMED  NuStep; x 5 minutes with Level 4 resistance, seat at 8 - for LE strengthening and lower limb mobility   -subjective information gathered during this time  In // bars: Forward/backward stepping; 5x D/B length of bars; verbal cueing to limit toe-out and heel strike Sidestepping with Green Tband just superior to bilat patellae; 5x D/B length of bars High knees 5x D/B  length of bars   Patient education: Discussed current progress with PT. Reviewed  PT plan of care and discussed prognosis.    *not today*  Squat to butt tap on edge of chair, with Airex on chair for increased seat height; 2x10  -verbal cueing and demo to reduce weightbearing on ischial tuberosities at bottom of squat High knees in bars; 3x D/B  Standing heel raise/toe raise; x10 alternating Standing march with light touch in single bar, in // bars; x10 alternating Sit to stand, reviewed      PATIENT EDUCATION:  Education details: see above for patient education details Person educated: Patient Education method: Explanation, Demonstration, and Handouts Education comprehension: verbalized understanding     HOME EXERCISE PROGRAM: Access Code: [email or web address] URL: [email or web address] Date: 10/12/2022 Prepared by: Laurie Roy  Exercises - Sit to Stand Without Arm Support  - 2 x daily - 7 x weekly - 2 sets - 10 reps - Heel Toe Raises with Unilateral Counter Support  - 2 x daily - 7 x weekly - 2 sets -  10 reps - Standing March with Counter Support  - 2 x daily - 7 x weekly - 2 sets - 10 reps      ASSESSMENT:   CLINICAL IMPRESSION: Patient has significantly improved DGI. She has improved 5TSTS and TUG, though short of established MDC/MCID. Patient is significantly challenged with stepping over obstacles and negotiating steps. She has notably improved static postural control as demonstrated by improved performance with Tandem standing. Patient does not yet have notable change in BERG or ABC Scale. FOTO is lower today, though this may be reflective of pt completing with therapist versus independently at IE. Patient has remaining deficits in impaired postural control, gait changes, difficulty with obstacle negotiation, hip and trunk control needed to prevent forward LOB with bending over/reaching to low-lying item. Patient will benefit from skilled PT to address above impairments and improve overall function.    REHAB POTENTIAL: Good   CLINICAL DECISION  MAKING: Evolving/moderate complexity   EVALUATION COMPLEXITY: Moderate     GOALS:   SHORT TERM GOALS: Target date: 10/30/2022   Pt will be independent with HEP in order to improve strength and balance in order to decrease fall risk and improve function at home. Baseline: 10/06/22: Baseline HEP to be initiated next visit.   11/17/22: Pt is compliant with HEP Goal status: ACHIEVED     LONG TERM GOALS: Target date: 12/04/2022   Pt will increase FOTO to at least 57 to demonstrate significant improvement in function at home related to balance  Baseline: 10/06/22: 54.   11/17/22: 48.   Goal status: NOT MET    2.  Pt will improve BERG by at least 3 points in order to demonstrate clinically significant improvement in balance.   Baseline: 10/06/22: Will complete next visit     10/08/22: 48/56    11/17/22: 48/56  Goal status: NOT MET   3.  Pt will improve ABC by at least 13% in order to demonstrate clinically significant improvement in balance confidence.      Baseline: 10/06/22: Will complete next visit     10/08/22: 62.5%.   11/17/22: 63.1% Goal status: NOT MET    4. Pt will decrease 5TSTS by at least 3 seconds in order to demonstrate clinically significant improvement in LE strength      Baseline: 10/06/22: 17 seconds.   11/17/22: 14.4 seconds.  Goal status: IN PROGRESS    5. Pt will improve DGI by at least 3 points in order to demonstrate clinically significant improvement in balance and decreased risk for falls.     Baseline: 10/06/22: Baseline to be completed next visit     10/08/22: 11/24.   11/17/22: 14/24 Goal status: ACHIEVED   6. Pt will decrease TUG to below 14 seconds/decrease in order to demonstrate decreased fall risk.  Baseline: 10/06/22: 19.5 sec.   11/17/22: 16.3 sec Goal status: IN PROGRESS         PLAN: PT FREQUENCY: 2x/week   PT DURATION: 4 weeks   PLANNED INTERVENTIONS: Therapeutic exercises, Therapeutic activity, Neuromuscular re-education, Balance training, Gait training,  Patient/Family education,    PLAN FOR NEXT SESSION: Continue with task practice for stepping with sound heel strike technique, postural control training, obstacle negotiation; strategies to improve ability to reach low to floor including squatting and functional reaching. Continue with gait stability training. Recommend continued PT 2x/week for 4 weeks.    Laurie Roy, PT, DPT #[id number]  Laurie Roy, PT 11/17/2022, 4:15 PM

## 2022-11-19 ENCOUNTER — Encounter: Payer: Self-pay | Admitting: Physical Therapy

## 2022-11-19 ENCOUNTER — Ambulatory Visit: Payer: PPO | Admitting: Physical Therapy

## 2022-11-19 DIAGNOSIS — R2689 Other abnormalities of gait and mobility: Secondary | ICD-10-CM | POA: Diagnosis not present

## 2022-11-19 DIAGNOSIS — R262 Difficulty in walking, not elsewhere classified: Secondary | ICD-10-CM

## 2022-11-19 DIAGNOSIS — M6281 Muscle weakness (generalized): Secondary | ICD-10-CM

## 2022-11-19 NOTE — Therapy (Signed)
OUTPATIENT PHYSICAL THERAPY TREATMENT    Patient Name: Laurie Roy MRN: [id number] DOB:[birth year], 82 y.o., female Today's Date: 11/12/22  PCP: Sallee Lange, NP REFERRING PROVIDER: Sallee Lange, NP  END OF SESSION:   PT End of Session - 11/19/22 1650     Visit Number 11    Number of Visits 17    Date for PT Re-Evaluation 01/01/23    Progress Note Due on Visit 10    PT Start Time 1532    PT Stop Time 1619    PT Time Calculation (min) 47 min    Equipment Utilized During Treatment Gait belt    Activity Tolerance Patient tolerated treatment well    Behavior During Therapy WFL for tasks assessed/performed                 Past Medical History:  Diagnosis Date   Breast cancer ([place]) 0093   Complication of anesthesia    Heart murmur    Personal history of chemotherapy    Personal history of radiation therapy    PONV (postoperative nausea and vomiting)    Past Surgical History:  Procedure Laterality Date   APPLICATION OF WOUND VAC Left 12/10/2020   Procedure: APPLICATION OF PREVENA  WOUND VAC;  Surgeon: Dereck Leep, MD;  Location: [facility];  Service: Orthopedics;  Laterality: Left;   COLONOSCOPY     KNEE ARTHROPLASTY Left 12/10/2020   Procedure: COMPUTER ASSISTED TOTAL KNEE ARTHROPLASTY;  Surgeon: Dereck Leep, MD;  Location: [facility];  Service: Orthopedics;  Laterality: Left;   LEFT HEART CATH AND CORONARY ANGIOGRAPHY Left 04/16/2021   Procedure: LEFT HEART CATH AND CORONARY ANGIOGRAPHY;  Surgeon: Corey Skains, MD;  Location: [facility];  Service: Cardiovascular;  Laterality: Left;   MASTECTOMY Left 2002   Patient Active Problem List   Diagnosis Date Noted   Angina pectoris ([place]) 04/16/2021   IGT (impaired glucose tolerance) 12/10/2020   Neuropathy 12/10/2020   Obesity (BMI 30-39.9) 12/10/2020   Primary osteoarthritis of left knee 09/16/2020   Primary osteoarthritis of right knee 09/16/2020   Polyarthralgia 03/26/2017   Vitamin  B12 deficiency 02/01/2017   Vitamin D deficiency 02/01/2017   HTN (hypertension) 02/17/2014   Hypercholesterolemia 02/17/2014    REFERRING DIAG:  G62.9 (ICD-10-CM) - Polyneuropathy, unspecified  R26.89 (ICD-10-CM) - Other abnormalities of gait and mobility    THERAPY DIAG:  Imbalance  Difficulty in walking, not elsewhere classified  Muscle weakness (generalized)  Rationale for Evaluation and Treatment Rehabilitation  PERTINENT HISTORY: Patient is an 82 year old female referred for balance and gait deficits with Hx of longstanding bilateral peripheral neuropathy, hx of L TKA (12/10/2020) and R knee pain secondary to OA. Patient reports SOB following panic attack during her L TKA rehab. Pt uses CPAP at night now for sleep apnea. She reports small heart blockage after that episode. She reports having increased difficulty walking during this workup with cardiology. Pt reports increased fluid accumulation from 230 lbs with her weight now being 179 lbs. Patient reports difficulty with walking at this time - she uses walker when going outside usually, especially on uneven ground. Pt finds herself walking without AD in home sometimes. Hx of vascular disease and peripheral neuropathy. Pt is notably challenged with fear of falling. Hx of dyspnea on exertion    Pain: Yes, R knee; doing better following cortisone injection  Numbness/Tingling: Yes, sensory change in hands and feet from neuropathy Focal Weakness: general LE weakness Recent changes in overall health/medication:  Yes, heart blockage found  Prior history of physical therapy for balance:  No Falls: Has patient fallen in last 6 months? Yes, Number of falls: 4, pt fell when riding lawn mower when ducking under limb (pt fell backward off of lawnmower). Pt fell in home 2x when bending over to grab item from floor, pt fell forward when getting out of bed.  Directional pattern for falls: Yes, forward falling when bending over  Imaging: Yes     Radiograph L knee 12/10/20 Status post total knee replacement with prosthetic components well-seated. No fracture or dislocation. Acute postoperative changes noted.       Prior level of function: Independent Occupational demands: Retired  Office manager: Media planner (jewelry, painting)    Red flags (bowel/bladder changes, saddle paresthesia, personal history of cancer, h/o spinal tumors, h/o compression fx, h/o abdominal aneurysm, abdominal pain, chills/fever, night sweats, nausea, vomiting, unrelenting pain): Positive for Hx of breast cancer (in remission), otherwise no other red flags    Weight Bearing Restrictions: No   Living Environment Lives with: lives alone, daughter lives close by  Lives in: House/apartment Has following equipment at home: Single point cane, Environmental consultant - 4 wheeled, and Grab bars Pt uses step-to pattern to get up 14 steps in home 3 steps to get into home (gets up those easily)  Step-in shower, no shower bench; pt has suction grab bar     Patient Goals: Able to walk better, improve balance    PRECAUTIONS: Fall history                                                                                                                                                                   SUBJECTIVE STATEMENT:  Patient reports no recent safety incidents at home and reports tolerating last visit well. She does report notable R knee stiffness and pain and is awaiting follow-up with orthopedics to discuss TKA. She does feel that exercise does help with R knee condition.    PAIN:  Are you having pain? No   OBJECTIVE: (objective measures completed at initial evaluation unless otherwise dated)   Patient Surveys  FOTO: 9, predicted improvement to 54 ABC: 62.5%     Cognition Patient is oriented to person, place, and time.  Recent memory is intact.  Remote memory is intact.  Attention span and concentration are intact.  Expressive speech is intact.  Patient's fund of  knowledge is within normal limits for educational level.                            Observation Venous distension and varicose veins in bilat legs, color change. No inc tissue temperature, calf girth, pitting edema, or significant calf tenderness.     GAIT:  Distance walked: 45 ft Assistive device utilized: Single point cane, RUE Level of assistance: SBA Comments: Pt walks with RLE externally rotated, wide BOS, decreased gait cadence, diminished heel strike, slow velocity    Posture: Pt rests in posterior pelvic tilt. Grossly kyphotic posture. Pes planus. Genu valgus R>L knee.      LE MMT:   MMT (out of 5) Right 10/06/2022 Left 10/06/2022  Hip flexion 4 4  Hip extension      Hip abduction 5 5  Hip adduction 5 5  Hip internal rotation      Hip external rotation      Knee flexion 5 5  Knee extension 5 5  Ankle dorsiflexion 4 5  Ankle plantarflexion      Ankle inversion      Ankle eversion      (* = pain; Blank rows = not tested)     Sensation Decreased light touch sensation in bilateral toes and dorsum of feet     Reflexes Deferred     Cranial Nerves Visual acuity and visual fields are intact  Extraocular muscles are intact; pt does have difficulty tracking vertically from top of visual field back to neutral position  Facial sensation is intact bilaterally  Facial strength is intact bilaterally  Hearing is normal as tested by gross conversation Palate elevates midline, normal phonation  Shoulder shrug strength is intact  Tongue protrudes midline     Coordination/Cerebellar Finger to Nose: WNL Heel to Shin: WNL Rapid alternating movements: WNL Finger Opposition: WNL Pronator Drift: Negative     FUNCTIONAL OUTCOME MEASURES     Results Comments  BERG 10/08/22: 48/56 Fall risk, in need of intervention  TUG 10/06/22: 19.5 seconds     5TSTS 10/06/22: 17 seconds    DGI 10/08/22: 11/24    10 Meter Gait Speed 10/08/22: Self-selected: s = 0.59 m/s; Fastest: s =  0.69 m/s Below normative values for full community ambulation  (Blank rows = not tested)          TODAY'S TREATMENT      Neuromuscular Re-education - for improved sensory integration, static and dynamic postural control, equilibrium and non-equilibrium coordination as needed for negotiating home and community environment and stepping over obstacles   In // bars:  Toe tapping, with bilat standing; alternating R/L 2x10 on 6-inch step with Airex pad on top - for fleeting unipedal stance as needed for obstacle negotiation   Standing feet together on Airex; 2x30 sec  Standing single hurdle step; 2x10 with each LE, over dumbbell on floor for visual target   -no UE support  Forward stepping over hurdles, 3 6-inch hurdles; 4x D/B  Cone reaching, pt reaching outside of BOS to challenge static postural control; x 5 cones with L and R hand, performed x 2 sets  Sit to stand with Airex under feet; additional Airex on chair to increase chair height; performed 2x10  Standing in stride stance with postural perturbations, multi-directional; 2 x 1 minute   *not today* Tagging sticky notes on wall with ball; multi-directional reaching requiring bending/leaning; x 3 minutes Balloon tapping for dynamic postural control and external perturbation; x 3 minutes Tandem standing on floor; 2x20 sec each position (RLE in back and LLE in back)  Airex pad standing march; 2x10 alternating Step up/down 3 consecutive Airex pads; 2x D/B length of parallel bars  -verbal cueing to limit pushing posterior thigh on edge of chair Functional reaching with cones; 10 cones on adjacent table; 2 trials of forward  reaching and cross-body reach to place cone down with each upper limb   -for postural control with forward leaning and reaching tasks    Therapeutic Exercise - improved strength as needed to improve performance of CKC activities/functional movements and as needed for power production to prevent fall during  episode of large postural perturbation, completion of performance-based outcome measures to detect fall risk and track progress    NuStep; x 5 minutes with Level 4 resistance, seat at 8 - for LE strengthening and lower limb mobility   -subjective information gathered during this time, 2 minutes unbilled   In // bars: Forward/backward stepping; 5x D/B length of bars; verbal cueing to limit toe-out and heel strike Sidestepping with Blue  Tband just superior to bilat patellae; 5x D/B length of bars High knees 5x D/B  length of bars   *not today*  Squat to butt tap on edge of chair, with Airex on chair for increased seat height; 2x10  -verbal cueing and demo to reduce weightbearing on ischial tuberosities at bottom of squat High knees in bars; 3x D/B  Standing heel raise/toe raise; x10 alternating Standing march with light touch in single bar, in // bars; x10 alternating Sit to stand, reviewed      PATIENT EDUCATION:  Education details: see above for patient education details Person educated: Patient Education method: Explanation, Demonstration, and Handouts Education comprehension: verbalized understanding     HOME EXERCISE PROGRAM: Access Code: [email or web address] URL: [email or web address] Date: 10/12/2022 Prepared by: Valentina Gu  Exercises - Sit to Stand Without Arm Support  - 2 x daily - 7 x weekly - 2 sets - 10 reps - Heel Toe Raises with Unilateral Counter Support  - 2 x daily - 7 x weekly - 2 sets - 10 reps - Standing March with Counter Support  - 2 x daily - 7 x weekly - 2 sets - 10 reps      ASSESSMENT:   CLINICAL IMPRESSION: Patient demonstrates excellent ability to complete righting reactions when responding to moderate postural perturbation in various positions. She is more challenged in stride stance with performance of standing balance work with perturbations. Pt is still markedly challenged with obstacle negotiation, but she is able to perform step-to  pattern with hurdle steps without significant LOB. Pt is also limited by comorbid knee OA which can cause RLE to buckle intermittently with prolonged weightbearing work and can also limit sit to stand performance. Patient has remaining deficits in impaired postural control, gait changes, difficulty with obstacle negotiation, hip and trunk control needed to prevent forward LOB with bending over/reaching to low-lying item. Patient will benefit from skilled PT to address above impairments and improve overall function.    REHAB POTENTIAL: Good   CLINICAL DECISION MAKING: Evolving/moderate complexity   EVALUATION COMPLEXITY: Moderate     GOALS:   SHORT TERM GOALS: Target date: 10/30/2022   Pt will be independent with HEP in order to improve strength and balance in order to decrease fall risk and improve function at home. Baseline: 10/06/22: Baseline HEP to be initiated next visit.   11/17/22: Pt is compliant with HEP Goal status: ACHIEVED     LONG TERM GOALS: Target date: 12/04/2022   Pt will increase FOTO to at least 57 to demonstrate significant improvement in function at home related to balance  Baseline: 10/06/22: 54.   11/17/22: 48.   Goal status: NOT MET    2.  Pt will improve BERG by at least 3 points in order to  demonstrate clinically significant improvement in balance.   Baseline: 10/06/22: Will complete next visit     10/08/22: 48/56    11/17/22: 48/56  Goal status: NOT MET   3.  Pt will improve ABC by at least 13% in order to demonstrate clinically significant improvement in balance confidence.      Baseline: 10/06/22: Will complete next visit     10/08/22: 62.5%.   11/17/22: 63.1% Goal status: NOT MET    4. Pt will decrease 5TSTS by at least 3 seconds in order to demonstrate clinically significant improvement in LE strength      Baseline: 10/06/22: 17 seconds.   11/17/22: 14.4 seconds.  Goal status: IN PROGRESS    5. Pt will improve DGI by at least 3 points in order to demonstrate  clinically significant improvement in balance and decreased risk for falls.     Baseline: 10/06/22: Baseline to be completed next visit     10/08/22: 11/24.   11/17/22: 14/24 Goal status: ACHIEVED   6. Pt will decrease TUG to below 14 seconds/decrease in order to demonstrate decreased fall risk.  Baseline: 10/06/22: 19.5 sec.   11/17/22: 16.3 sec Goal status: IN PROGRESS         PLAN: PT FREQUENCY: 2x/week   PT DURATION: 4 weeks   PLANNED INTERVENTIONS: Therapeutic exercises, Therapeutic activity, Neuromuscular re-education, Balance training, Gait training, Patient/Family education,    PLAN FOR NEXT SESSION: Continue with task practice for stepping with sound heel strike technique, postural control training, obstacle negotiation; strategies to improve ability to reach low to floor including squatting and functional reaching. Continue with gait stability training. Recommend continued PT 2x/week for 4 weeks.    Valentina Gu, PT, DPT #[id number]  Eilleen Kempf, PT 11/19/2022, 4:53 PM

## 2022-11-24 ENCOUNTER — Ambulatory Visit: Payer: PPO | Admitting: Physical Therapy

## 2022-11-24 DIAGNOSIS — M6281 Muscle weakness (generalized): Secondary | ICD-10-CM

## 2022-11-24 DIAGNOSIS — I5022 Chronic systolic (congestive) heart failure: Secondary | ICD-10-CM | POA: Diagnosis not present

## 2022-11-24 DIAGNOSIS — G4733 Obstructive sleep apnea (adult) (pediatric): Secondary | ICD-10-CM | POA: Diagnosis not present

## 2022-11-24 DIAGNOSIS — R262 Difficulty in walking, not elsewhere classified: Secondary | ICD-10-CM

## 2022-11-24 DIAGNOSIS — I2510 Atherosclerotic heart disease of native coronary artery without angina pectoris: Secondary | ICD-10-CM | POA: Diagnosis not present

## 2022-11-24 DIAGNOSIS — R2689 Other abnormalities of gait and mobility: Secondary | ICD-10-CM | POA: Diagnosis not present

## 2022-11-24 DIAGNOSIS — I10 Essential (primary) hypertension: Secondary | ICD-10-CM | POA: Diagnosis not present

## 2022-11-24 DIAGNOSIS — E7800 Pure hypercholesterolemia, unspecified: Secondary | ICD-10-CM | POA: Diagnosis not present

## 2022-11-24 NOTE — Therapy (Addendum)
OUTPATIENT PHYSICAL THERAPY TREATMENT    Patient Name: Laurie Roy MRN: [id number] DOB:03-01-1941, 82 y.o., female Today's Date: 11/12/22  PCP: Sallee Lange, NP REFERRING PROVIDER: Sallee Lange, NP  END OF SESSION:   PT End of Session - 11/24/22 0931     Visit Number 12    Number of Visits 17    Date for PT Re-Evaluation 01/01/23    Progress Note Due on Visit 10    PT Start Time 0931    PT Stop Time 1016    PT Time Calculation (min) 45 min    Equipment Utilized During Treatment Gait belt    Activity Tolerance Patient tolerated treatment well    Behavior During Therapy WFL for tasks assessed/performed                 Past Medical History:  Diagnosis Date   Breast cancer ([place]) 6568   Complication of anesthesia    Heart murmur    Personal history of chemotherapy    Personal history of radiation therapy    PONV (postoperative nausea and vomiting)    Past Surgical History:  Procedure Laterality Date   APPLICATION OF WOUND VAC Left 12/10/2020   Procedure: APPLICATION OF PREVENA  WOUND VAC;  Surgeon: Dereck Leep, MD;  Location: [facility];  Service: Orthopedics;  Laterality: Left;   COLONOSCOPY     KNEE ARTHROPLASTY Left 12/10/2020   Procedure: COMPUTER ASSISTED TOTAL KNEE ARTHROPLASTY;  Surgeon: Dereck Leep, MD;  Location: [facility];  Service: Orthopedics;  Laterality: Left;   LEFT HEART CATH AND CORONARY ANGIOGRAPHY Left 04/16/2021   Procedure: LEFT HEART CATH AND CORONARY ANGIOGRAPHY;  Surgeon: Corey Skains, MD;  Location: [facility];  Service: Cardiovascular;  Laterality: Left;   MASTECTOMY Left 2002   Patient Active Problem List   Diagnosis Date Noted   Angina pectoris ([place]) 04/16/2021   IGT (impaired glucose tolerance) 12/10/2020   Neuropathy 12/10/2020   Obesity (BMI 30-39.9) 12/10/2020   Primary osteoarthritis of left knee 09/16/2020   Primary osteoarthritis of right knee 09/16/2020   Polyarthralgia 03/26/2017   Vitamin  B12 deficiency 02/01/2017   Vitamin D deficiency 02/01/2017   HTN (hypertension) 02/17/2014   Hypercholesterolemia 02/17/2014    REFERRING DIAG:  G62.9 (ICD-10-CM) - Polyneuropathy, unspecified  R26.89 (ICD-10-CM) - Other abnormalities of gait and mobility    THERAPY DIAG:  Imbalance  Difficulty in walking, not elsewhere classified  Muscle weakness (generalized)  Rationale for Evaluation and Treatment Rehabilitation  PERTINENT HISTORY: Patient is an 82 year old female referred for balance and gait deficits with Hx of longstanding bilateral peripheral neuropathy, hx of L TKA (12/10/2020) and R knee pain secondary to OA. Patient reports SOB following panic attack during her L TKA rehab. Pt uses CPAP at night now for sleep apnea. She reports small heart blockage after that episode. She reports having increased difficulty walking during this workup with cardiology. Pt reports increased fluid accumulation from 230 lbs with her weight now being 179 lbs. Patient reports difficulty with walking at this time - she uses walker when going outside usually, especially on uneven ground. Pt finds herself walking without AD in home sometimes. Hx of vascular disease and peripheral neuropathy. Pt is notably challenged with fear of falling. Hx of dyspnea on exertion    Pain: Yes, R knee; doing better following cortisone injection  Numbness/Tingling: Yes, sensory change in hands and feet from neuropathy Focal Weakness: general LE weakness Recent changes in overall health/medication:  Yes, heart blockage found  Prior history of physical therapy for balance:  No Falls: Has patient fallen in last 6 months? Yes, Number of falls: 4, pt fell when riding lawn mower when ducking under limb (pt fell backward off of lawnmower). Pt fell in home 2x when bending over to grab item from floor, pt fell forward when getting out of bed.  Directional pattern for falls: Yes, forward falling when bending over  Imaging: Yes     Radiograph L knee 12/10/20 Status post total knee replacement with prosthetic components well-seated. No fracture or dislocation. Acute postoperative changes noted.       Prior level of function: Independent Occupational demands: Retired  Office manager: Media planner (jewelry, painting)    Red flags (bowel/bladder changes, saddle paresthesia, personal history of cancer, h/o spinal tumors, h/o compression fx, h/o abdominal aneurysm, abdominal pain, chills/fever, night sweats, nausea, vomiting, unrelenting pain): Positive for Hx of breast cancer (in remission), otherwise no other red flags    Weight Bearing Restrictions: No   Living Environment Lives with: lives alone, daughter lives close by  Lives in: House/apartment Has following equipment at home: Single point cane, Environmental consultant - 4 wheeled, and Grab bars Pt uses step-to pattern to get up 14 steps in home 3 steps to get into home (gets up those easily)  Step-in shower, no shower bench; pt has suction grab bar     Patient Goals: Able to walk better, improve balance    PRECAUTIONS: Fall history                                                                                                                                                                   SUBJECTIVE STATEMENT: Patient states that the neuropathy is feeling better today and has had more sensation the past couple of days.Patient reports feeling more confident with walking. Patient reports no recent safety incidents at home and reports tolerating last visit well.   PAIN:  Are you having pain? No   OBJECTIVE: (objective measures completed at initial evaluation unless otherwise dated)   Patient Surveys  FOTO: 48, predicted improvement to 57 ABC: 62.5%     Cognition Patient is oriented to person, place, and time.  Recent memory is intact.  Remote memory is intact.  Attention span and concentration are intact.  Expressive speech is intact.  Patient's fund of knowledge is  within normal limits for educational level.                            Observation Venous distension and varicose veins in bilat legs, color change. No inc tissue temperature, calf girth, pitting edema, or significant calf tenderness.     GAIT: Distance walked: 45 ft Assistive device  utilized: Single point cane, RUE Level of assistance: SBA Comments: Pt walks with RLE externally rotated, wide BOS, decreased gait cadence, diminished heel strike, slow velocity    Posture: Pt rests in posterior pelvic tilt. Grossly kyphotic posture. Pes planus. Genu valgus R>L knee.      LE MMT:   MMT (out of 5) Right 10/06/2022 Left 10/06/2022  Hip flexion 4 4  Hip extension      Hip abduction 5 5  Hip adduction 5 5  Hip internal rotation      Hip external rotation      Knee flexion 5 5  Knee extension 5 5  Ankle dorsiflexion 4 5  Ankle plantarflexion      Ankle inversion      Ankle eversion      (* = pain; Blank rows = not tested)     Sensation Decreased light touch sensation in bilateral toes and dorsum of feet     Reflexes Deferred     Cranial Nerves Visual acuity and visual fields are intact  Extraocular muscles are intact; pt does have difficulty tracking vertically from top of visual field back to neutral position  Facial sensation is intact bilaterally  Facial strength is intact bilaterally  Hearing is normal as tested by gross conversation Palate elevates midline, normal phonation  Shoulder shrug strength is intact  Tongue protrudes midline     Coordination/Cerebellar Finger to Nose: WNL Heel to Shin: WNL Rapid alternating movements: WNL Finger Opposition: WNL Pronator Drift: Negative     FUNCTIONAL OUTCOME MEASURES     Results Comments  BERG 10/08/22: 48/56 Fall risk, in need of intervention  TUG 10/06/22: 19.5 seconds     5TSTS 10/06/22: 17 seconds    DGI 10/08/22: 11/24    10 Meter Gait Speed 10/08/22: Self-selected: s = 0.59 m/s; Fastest: s = 0.69 m/s Below  normative values for full community ambulation  (Blank rows = not tested)          TODAY'S TREATMENT      Neuromuscular Re-education - for improved sensory integration, static and dynamic postural control, equilibrium and non-equilibrium coordination as needed for negotiating home and community environment and stepping over obstacles   In // bars:  Toe tapping, with bilat standing; alternating R/L 3x10 on 6-inch step with Airex pad on top - for fleeting unipedal stance as needed for obstacle negotiation     Sit to stand with Airex under feet; additional Airex on chair to increase chair height; performed 2x10  Standing in stride stance with postural perturbations, multi-directional; 4 x 1 minute  Tagging sticky notes on wall with ball; multi-directional reaching requiring bending/leaning; x 4 minutes - for forward reaching and having trunk stability while doing so.  *not today* Standing feet together on Airex; 2x30 sec Standing single hurdle step; 2x10 with each LE, over dumbbell on floor for visual target   -no UE support  Forward stepping over hurdles, 3 6-inch hurdles; 4x D/B Cone reaching, pt reaching outside of BOS to challenge static postural control; x 5 cones with L and R hand, performed x 2 sets  Balloon tapping for dynamic postural control and external perturbation; x 3 minutes Tandem standing on floor; 2x20 sec each position (RLE in back and LLE in back)  Airex pad standing march; 2x10 alternating Step up/down 3 consecutive Airex pads; 2x D/B length of parallel bars  -verbal cueing to limit pushing posterior thigh on edge of chair Functional reaching with cones; 10 cones on adjacent  table; 2 trials of forward reaching and cross-body reach to place cone down with each upper limb   -for postural control with forward leaning and reaching tasks    Therapeutic Exercise - improved strength as needed to improve performance of CKC activities/functional movements and as  needed for power production to prevent fall during episode of large postural perturbation, completion of performance-based outcome measures to detect fall risk and track progress   Walked 5 minutes around the gym space  NuStep; x 3 minutes with Level 4 resistance, seat at 8 - for LE strengthening and lower limb mobility   -subjective information gathered during this time, 2 minutes unbilled   In // bars: Forward/backward stepping; 5x D/B length of bars; verbal cueing to limit toe-out and heel strike Sidestepping with Blue Tband just superior to bilat patellae; 5x D/B length floor ladder   *not today*  High knees 5x D/B  length of bars Squat to butt tap on edge of chair, with Airex on chair for increased seat height; 2x10  -verbal cueing and demo to reduce weightbearing on ischial tuberosities at bottom of squat  Standing heel raise/toe raise; x10 alternating Standing march with light touch in single bar, in // bars; x10 alternating Sit to stand, reviewed      PATIENT EDUCATION:  Education details: see above for patient education details Person educated: Patient Education method: Explanation, Demonstration, and Handouts Education comprehension: verbalized understanding     HOME EXERCISE PROGRAM: Access Code: [email or web address] URL: [email or web address] Date: 10/12/2022 Prepared by: Valentina Gu  Exercises - Sit to Stand Without Arm Support  - 2 x daily - 7 x weekly - 2 sets - 10 reps - Heel Toe Raises with Unilateral Counter Support  - 2 x daily - 7 x weekly - 2 sets - 10 reps - Standing March with Counter Support  - 2 x daily - 7 x weekly - 2 sets - 10 reps      ASSESSMENT:   CLINICAL IMPRESSION: Patient demonstrates excellent ability to complete righting reactions when responding to moderate postural perturbation in various positions. She is more challenged in stride stance with performance of standing balance work with perturbations. Pt demonstrated some  difficulty with sit-to-stand and reaching normal trunk extension. Pt is also limited by comorbid knee OA which can limit sit to stand performance. Patient has remaining deficits in impaired postural control, gait changes, difficulty with obstacle negotiation, hip and trunk control needed to prevent forward LOB with bending over/reaching to low-lying item. Patient will benefit from skilled PT to address above impairments and improve overall function.    REHAB POTENTIAL: Good   CLINICAL DECISION MAKING: Evolving/moderate complexity   EVALUATION COMPLEXITY: Moderate     GOALS:   SHORT TERM GOALS: Target date: 10/30/2022   Pt will be independent with HEP in order to improve strength and balance in order to decrease fall risk and improve function at home. Baseline: 10/06/22: Baseline HEP to be initiated next visit.   11/17/22: Pt is compliant with HEP Goal status: ACHIEVED     LONG TERM GOALS: Target date: 12/04/2022   Pt will increase FOTO to at least 57 to demonstrate significant improvement in function at home related to balance  Baseline: 10/06/22: 54.   11/17/22: 48.   Goal status: NOT MET    2.  Pt will improve BERG by at least 3 points in order to demonstrate clinically significant improvement in balance.   Baseline: 10/06/22: Will complete next visit  10/08/22: 48/56    11/17/22: 48/56  Goal status: NOT MET   3.  Pt will improve ABC by at least 13% in order to demonstrate clinically significant improvement in balance confidence.      Baseline: 10/06/22: Will complete next visit     10/08/22: 62.5%.   11/17/22: 63.1% Goal status: NOT MET    4. Pt will decrease 5TSTS by at least 3 seconds in order to demonstrate clinically significant improvement in LE strength      Baseline: 10/06/22: 17 seconds.   11/17/22: 14.4 seconds.  Goal status: IN PROGRESS    5. Pt will improve DGI by at least 3 points in order to demonstrate clinically significant improvement in balance and decreased risk for  falls.     Baseline: 10/06/22: Baseline to be completed next visit     10/08/22: 11/24.   11/17/22: 14/24 Goal status: ACHIEVED   6. Pt will decrease TUG to below 14 seconds/decrease in order to demonstrate decreased fall risk.  Baseline: 10/06/22: 19.5 sec.   11/17/22: 16.3 sec Goal status: IN PROGRESS         PLAN: PT FREQUENCY: 2x/week   PT DURATION: 4 weeks   PLANNED INTERVENTIONS: Therapeutic exercises, Therapeutic activity, Neuromuscular re-education, Balance training, Gait training, Patient/Family education,    PLAN FOR NEXT SESSION: Continue with task practice for stepping with sound heel strike technique, postural control training, obstacle negotiation; strategies to improve ability to reach low to floor including squatting and functional reaching. Continue with gait stability training. Recommend continued PT 2x/week for 4 weeks.    Valentina Gu, PT, DPT #[id number]  Tamera Reason, SPT

## 2022-11-26 ENCOUNTER — Ambulatory Visit: Payer: PPO | Admitting: Physical Therapy

## 2022-11-26 ENCOUNTER — Encounter: Payer: Self-pay | Admitting: Physical Therapy

## 2022-11-26 DIAGNOSIS — M6281 Muscle weakness (generalized): Secondary | ICD-10-CM

## 2022-11-26 DIAGNOSIS — R2689 Other abnormalities of gait and mobility: Secondary | ICD-10-CM | POA: Diagnosis not present

## 2022-11-26 DIAGNOSIS — R262 Difficulty in walking, not elsewhere classified: Secondary | ICD-10-CM

## 2022-11-26 NOTE — Therapy (Signed)
OUTPATIENT PHYSICAL THERAPY TREATMENT    Patient Name: Laurie Roy MRN: [id number] DOB:[birth year], 82 y.o., female Today's Date: 11/12/22  PCP: Sallee Lange, NP REFERRING PROVIDER: Sallee Lange, NP  END OF SESSION:   PT End of Session - 11/26/22 1412     Visit Number 13    Number of Visits 17    Date for PT Re-Evaluation 01/01/23    Progress Note Due on Visit 10    PT Start Time 1312    PT Stop Time 1357    PT Time Calculation (min) 45 min    Equipment Utilized During Treatment Gait belt    Activity Tolerance Patient tolerated treatment well    Behavior During Therapy WFL for tasks assessed/performed                  Past Medical History:  Diagnosis Date   Breast cancer ([place]) 0300   Complication of anesthesia    Heart murmur    Personal history of chemotherapy    Personal history of radiation therapy    PONV (postoperative nausea and vomiting)    Past Surgical History:  Procedure Laterality Date   APPLICATION OF WOUND VAC Left 12/10/2020   Procedure: APPLICATION OF PREVENA  WOUND VAC;  Surgeon: Dereck Leep, MD;  Location: [facility];  Service: Orthopedics;  Laterality: Left;   COLONOSCOPY     KNEE ARTHROPLASTY Left 12/10/2020   Procedure: COMPUTER ASSISTED TOTAL KNEE ARTHROPLASTY;  Surgeon: Dereck Leep, MD;  Location: [facility];  Service: Orthopedics;  Laterality: Left;   LEFT HEART CATH AND CORONARY ANGIOGRAPHY Left 04/16/2021   Procedure: LEFT HEART CATH AND CORONARY ANGIOGRAPHY;  Surgeon: Corey Skains, MD;  Location: [facility];  Service: Cardiovascular;  Laterality: Left;   MASTECTOMY Left 2002   Patient Active Problem List   Diagnosis Date Noted   Angina pectoris ([place]) 04/16/2021   IGT (impaired glucose tolerance) 12/10/2020   Neuropathy 12/10/2020   Obesity (BMI 30-39.9) 12/10/2020   Primary osteoarthritis of left knee 09/16/2020   Primary osteoarthritis of right knee 09/16/2020   Polyarthralgia 03/26/2017    Vitamin B12 deficiency 02/01/2017   Vitamin D deficiency 02/01/2017   HTN (hypertension) 02/17/2014   Hypercholesterolemia 02/17/2014    REFERRING DIAG:  G62.9 (ICD-10-CM) - Polyneuropathy, unspecified  R26.89 (ICD-10-CM) - Other abnormalities of gait and mobility    THERAPY DIAG:  No diagnosis found.  Rationale for Evaluation and Treatment Rehabilitation  PERTINENT HISTORY: Patient is an 82 year old female referred for balance and gait deficits with Hx of longstanding bilateral peripheral neuropathy, hx of L TKA (12/10/2020) and R knee pain secondary to OA. Patient reports SOB following panic attack during her L TKA rehab. Pt uses CPAP at night now for sleep apnea. She reports small heart blockage after that episode. She reports having increased difficulty walking during this workup with cardiology. Pt reports increased fluid accumulation from 230 lbs with her weight now being 179 lbs. Patient reports difficulty with walking at this time - she uses walker when going outside usually, especially on uneven ground. Pt finds herself walking without AD in home sometimes. Hx of vascular disease and peripheral neuropathy. Pt is notably challenged with fear of falling. Hx of dyspnea on exertion    Pain: Yes, R knee; doing better following cortisone injection  Numbness/Tingling: Yes, sensory change in hands and feet from neuropathy Focal Weakness: general LE weakness Recent changes in overall health/medication: Yes, heart blockage found  Prior history of  physical therapy for balance:  No Falls: Has patient fallen in last 6 months? Yes, Number of falls: 4, pt fell when riding lawn mower when ducking under limb (pt fell backward off of lawnmower). Pt fell in home 2x when bending over to grab item from floor, pt fell forward when getting out of bed.  Directional pattern for falls: Yes, forward falling when bending over  Imaging: Yes    Radiograph L knee 12/10/20 Status post total knee replacement with  prosthetic components well-seated. No fracture or dislocation. Acute postoperative changes noted.       Prior level of function: Independent Occupational demands: Retired  Office manager: Media planner (jewelry, painting)    Red flags (bowel/bladder changes, saddle paresthesia, personal history of cancer, h/o spinal tumors, h/o compression fx, h/o abdominal aneurysm, abdominal pain, chills/fever, night sweats, nausea, vomiting, unrelenting pain): Positive for Hx of breast cancer (in remission), otherwise no other red flags    Weight Bearing Restrictions: No   Living Environment Lives with: lives alone, daughter lives close by  Lives in: House/apartment Has following equipment at home: Single point cane, Environmental consultant - 4 wheeled, and Grab bars Pt uses step-to pattern to get up 14 steps in home 3 steps to get into home (gets up those easily)  Step-in shower, no shower bench; pt has suction grab bar     Patient Goals: Able to walk better, improve balance    PRECAUTIONS: Fall history                                                                                                                                                                   SUBJECTIVE STATEMENT: Patient states that her knee and shoulder have been bothering her a little bit more this week but has not been affecting her balance very much. Pt reports that sat the start of the visit there was limited sensation in her feet. Patient reports no recent safety incidents at home and reports tolerating last visit well.    PAIN:  Are you having pain? No   OBJECTIVE: (objective measures completed at initial evaluation unless otherwise dated)   Patient Surveys  FOTO: 28, predicted improvement to 54 ABC: 62.5%     Cognition Patient is oriented to person, place, and time.  Recent memory is intact.  Remote memory is intact.  Attention span and concentration are intact.  Expressive speech is intact.  Patient's fund of knowledge is within  normal limits for educational level.                            Observation Venous distension and varicose veins in bilat legs, color change. No inc tissue temperature, calf girth, pitting edema, or significant calf tenderness.  GAIT: Distance walked: 45 ft Assistive device utilized: Single point cane, RUE Level of assistance: SBA Comments: Pt walks with RLE externally rotated, wide BOS, decreased gait cadence, diminished heel strike, slow velocity    Posture: Pt rests in posterior pelvic tilt. Grossly kyphotic posture. Pes planus. Genu valgus R>L knee.      LE MMT:   MMT (out of 5) Right 10/06/2022 Left 10/06/2022  Hip flexion 4 4  Hip extension      Hip abduction 5 5  Hip adduction 5 5  Hip internal rotation      Hip external rotation      Knee flexion 5 5  Knee extension 5 5  Ankle dorsiflexion 4 5  Ankle plantarflexion      Ankle inversion      Ankle eversion      (* = pain; Blank rows = not tested)     Sensation Decreased light touch sensation in bilateral toes and dorsum of feet     Reflexes Deferred     Cranial Nerves Visual acuity and visual fields are intact  Extraocular muscles are intact; pt does have difficulty tracking vertically from top of visual field back to neutral position  Facial sensation is intact bilaterally  Facial strength is intact bilaterally  Hearing is normal as tested by gross conversation Palate elevates midline, normal phonation  Shoulder shrug strength is intact  Tongue protrudes midline     Coordination/Cerebellar Finger to Nose: WNL Heel to Shin: WNL Rapid alternating movements: WNL Finger Opposition: WNL Pronator Drift: Negative     FUNCTIONAL OUTCOME MEASURES     Results Comments  BERG 10/08/22: 48/56 Fall risk, in need of intervention  TUG 10/06/22: 19.5 seconds     5TSTS 10/06/22: 17 seconds    DGI 10/08/22: 11/24    10 Meter Gait Speed 10/08/22: Self-selected: s = 0.59 m/s; Fastest: s = 0.69 m/s Below  normative values for full community ambulation  (Blank rows = not tested)          TODAY'S TREATMENT      Neuromuscular Re-education - for improved sensory integration, static and dynamic postural control, equilibrium and non-equilibrium coordination as needed for negotiating home and community environment and stepping over obstacles  In // bars:  Toe tapping, with bilat standing; alternating R/L 2x10 on 6-inch step with Airex pad on top - for fleeting unipedal stance as needed for obstacle negotiation    Sit to stand with Airex under feet; additional Airex on chair to increase chair height; performed 2x10  Standing in stride stance with postural perturbations, multi-directional; 3 x 1 minute   Cone reaching, pt reaching outside of BOS to challenge static postural control; x 7 cones with L and R hand, performed x 3 sets with a moving a step back after each set for increased forward reach   Forward stepping over hurdles with a step-to pattern, 3 6-inch hurdles; 5x D/B  Tandem standing on floor; 2x30 sec each position (RLE in back and LLE in back)    *not today* Standing feet together on Airex; 2x30 sec Standing single hurdle step; 2x10 with each LE, over dumbbell on floor for visual target   -no UE support Tagging sticky notes on wall with ball; multi-directional reaching requiring bending/leaning; x 4 minutes - for forward reaching and having trunk stability while doing so. Balloon tapping for dynamic postural control and external perturbation; x 3 minutes  Airex pad standing march; 2x10 alternating Step up/down 3 consecutive Airex pads;  2x D/B length of parallel bars  -verbal cueing to limit pushing posterior thigh on edge of chair Functional reaching with cones; 10 cones on adjacent table; 2 trials of forward reaching and cross-body reach to place cone down with each upper limb   -for postural control with forward leaning and reaching tasks    Therapeutic Exercise -  improved strength as needed to improve performance of CKC activities/functional movements and as needed for power production to prevent fall during episode of large postural perturbation, completion of performance-based outcome measures to detect fall risk and track progress   NuStep; x 5 minutes with Level 4 resistance, seat at 8 - for LE strengthening and lower limb mobility   -subjective information gathered during this time, 2 minutes unbilled     *not today* Forward/backward stepping; 5x D/B length of bars; verbal cueing to limit toe-out and heel strike Sidestepping with Blue Tband just superior to bilat patellae; 5x D/B length agility ladder High knees 5x D/B  length of bars Squat to butt tap on edge of chair, with Airex on chair for increased seat height; 2x10  -verbal cueing and demo to reduce weightbearing on ischial tuberosities at bottom of squat  Standing heel raise/toe raise; x10 alternating Standing march with light touch in single bar, in // bars; x10 alternating Sit to stand, reviewed      PATIENT EDUCATION:  Education details: see above for patient education details Person educated: Patient Education method: Explanation, Demonstration, and Handouts Education comprehension: verbalized understanding     HOME EXERCISE PROGRAM: Access Code: [email or web address] URL: [email or web address] Date: 10/12/2022 Prepared by: Valentina Gu  Exercises - Sit to Stand Without Arm Support  - 2 x daily - 7 x weekly - 2 sets - 10 reps - Heel Toe Raises with Unilateral Counter Support  - 2 x daily - 7 x weekly - 2 sets - 10 reps - Standing March with Counter Support  - 2 x daily - 7 x weekly - 2 sets - 10 reps      ASSESSMENT:   CLINICAL IMPRESSION: Patient demonstrated moderate difficulty with new cone reaching activity and was hesitant with distance but became more comfortable and was able to increase back to original distance with each set. Patient also has some  difficulty with consecutive  stepping over three 6 inch hurdles but performed better with step-to pattern on the hurdles. Patient demonstrates excellent ability to complete righting reactions when responding to moderate postural perturbation in various positions. Pt demonstrated some difficulty with sit-to-stand and reaching normal trunk extension. Pt is also limited by comorbid knee OA which can limit sit to stand performance. Patient has remaining deficits in impaired postural control, gait changes, difficulty with obstacle negotiation, hip and trunk control needed to prevent forward LOB with bending over/reaching to low-lying item. Patient will benefit from skilled PT to address above impairments and improve overall function.    REHAB POTENTIAL: Good   CLINICAL DECISION MAKING: Evolving/moderate complexity   EVALUATION COMPLEXITY: Moderate     GOALS:   SHORT TERM GOALS: Target date: 10/30/2022   Pt will be independent with HEP in order to improve strength and balance in order to decrease fall risk and improve function at home. Baseline: 10/06/22: Baseline HEP to be initiated next visit.   11/17/22: Pt is compliant with HEP Goal status: ACHIEVED     LONG TERM GOALS: Target date: 12/04/2022   Pt will increase FOTO to at least 57 to demonstrate significant improvement in function at home  related to balance  Baseline: 10/06/22: 54.   11/17/22: 48.   Goal status: NOT MET    2.  Pt will improve BERG by at least 3 points in order to demonstrate clinically significant improvement in balance.   Baseline: 10/06/22: Will complete next visit     10/08/22: 48/56    11/17/22: 48/56  Goal status: NOT MET   3.  Pt will improve ABC by at least 13% in order to demonstrate clinically significant improvement in balance confidence.      Baseline: 10/06/22: Will complete next visit     10/08/22: 62.5%.   11/17/22: 63.1% Goal status: NOT MET    4. Pt will decrease 5TSTS by at least 3 seconds in order to  demonstrate clinically significant improvement in LE strength      Baseline: 10/06/22: 17 seconds.   11/17/22: 14.4 seconds.  Goal status: IN PROGRESS    5. Pt will improve DGI by at least 3 points in order to demonstrate clinically significant improvement in balance and decreased risk for falls.     Baseline: 10/06/22: Baseline to be completed next visit     10/08/22: 11/24.   11/17/22: 14/24 Goal status: ACHIEVED   6. Pt will decrease TUG to below 14 seconds/decrease in order to demonstrate decreased fall risk.  Baseline: 10/06/22: 19.5 sec.   11/17/22: 16.3 sec Goal status: IN PROGRESS         PLAN: PT FREQUENCY: 2x/week   PT DURATION: 4 weeks   PLANNED INTERVENTIONS: Therapeutic exercises, Therapeutic activity, Neuromuscular re-education, Balance training, Gait training, Patient/Family education,    PLAN FOR NEXT SESSION: Continue with task practice for stepping with sound heel strike technique, postural control training, obstacle negotiation; strategies to improve ability to reach low to floor including squatting and functional reaching. Continue with gait stability training. Recommend continued PT 2x/week for 4 weeks.    Valentina Gu, PT, DPT #[id number]  Tamera Reason, SPT

## 2022-12-01 ENCOUNTER — Encounter: Payer: Self-pay | Admitting: Physical Therapy

## 2022-12-01 ENCOUNTER — Ambulatory Visit: Payer: PPO | Admitting: Physical Therapy

## 2022-12-01 DIAGNOSIS — R2689 Other abnormalities of gait and mobility: Secondary | ICD-10-CM | POA: Diagnosis not present

## 2022-12-01 DIAGNOSIS — G4733 Obstructive sleep apnea (adult) (pediatric): Secondary | ICD-10-CM | POA: Diagnosis not present

## 2022-12-01 DIAGNOSIS — M6281 Muscle weakness (generalized): Secondary | ICD-10-CM

## 2022-12-01 DIAGNOSIS — R262 Difficulty in walking, not elsewhere classified: Secondary | ICD-10-CM

## 2022-12-01 NOTE — Therapy (Unsigned)
OUTPATIENT PHYSICAL THERAPY TREATMENT    Patient Name: Laurie Roy MRN: [id number] DOB:19-Feb-1941, 82 y.o., female Today's Date: 12/01/22  PCP: Sallee Lange, NP REFERRING PROVIDER: Sallee Lange, NP  END OF SESSION:   PT End of Session - 12/01/22 1507     Visit Number 14    Number of Visits 17    Date for PT Re-Evaluation 01/01/23    Progress Note Due on Visit 10    PT Start Time 1415    PT Stop Time 1501    PT Time Calculation (min) 46 min    Equipment Utilized During Treatment Gait belt    Activity Tolerance Patient tolerated treatment well    Behavior During Therapy WFL for tasks assessed/performed                   Past Medical History:  Diagnosis Date   Breast cancer ([place]) 7341   Complication of anesthesia    Heart murmur    Personal history of chemotherapy    Personal history of radiation therapy    PONV (postoperative nausea and vomiting)    Past Surgical History:  Procedure Laterality Date   APPLICATION OF WOUND VAC Left 12/10/2020   Procedure: APPLICATION OF PREVENA  WOUND VAC;  Surgeon: Dereck Leep, MD;  Location: [facility];  Service: Orthopedics;  Laterality: Left;   COLONOSCOPY     KNEE ARTHROPLASTY Left 12/10/2020   Procedure: COMPUTER ASSISTED TOTAL KNEE ARTHROPLASTY;  Surgeon: Dereck Leep, MD;  Location: [facility];  Service: Orthopedics;  Laterality: Left;   LEFT HEART CATH AND CORONARY ANGIOGRAPHY Left 04/16/2021   Procedure: LEFT HEART CATH AND CORONARY ANGIOGRAPHY;  Surgeon: Corey Skains, MD;  Location: [facility];  Service: Cardiovascular;  Laterality: Left;   MASTECTOMY Left 2002   Patient Active Problem List   Diagnosis Date Noted   Angina pectoris ([place]) 04/16/2021   IGT (impaired glucose tolerance) 12/10/2020   Neuropathy 12/10/2020   Obesity (BMI 30-39.9) 12/10/2020   Primary osteoarthritis of left knee 09/16/2020   Primary osteoarthritis of right knee 09/16/2020   Polyarthralgia 03/26/2017    Vitamin B12 deficiency 02/01/2017   Vitamin D deficiency 02/01/2017   HTN (hypertension) 02/17/2014   Hypercholesterolemia 02/17/2014    REFERRING DIAG:  G62.9 (ICD-10-CM) - Polyneuropathy, unspecified  R26.89 (ICD-10-CM) - Other abnormalities of gait and mobility    THERAPY DIAG:  Imbalance  Difficulty in walking, not elsewhere classified  Muscle weakness (generalized)  Rationale for Evaluation and Treatment Rehabilitation  PERTINENT HISTORY: Patient is an 82 year old female referred for balance and gait deficits with Hx of longstanding bilateral peripheral neuropathy, hx of L TKA (12/10/2020) and R knee pain secondary to OA. Patient reports SOB following panic attack during her L TKA rehab. Pt uses CPAP at night now for sleep apnea. She reports small heart blockage after that episode. She reports having increased difficulty walking during this workup with cardiology. Pt reports increased fluid accumulation from 230 lbs with her weight now being 179 lbs. Patient reports difficulty with walking at this time - she uses walker when going outside usually, especially on uneven ground. Pt finds herself walking without AD in home sometimes. Hx of vascular disease and peripheral neuropathy. Pt is notably challenged with fear of falling. Hx of dyspnea on exertion    Pain: Yes, R knee; doing better following cortisone injection  Numbness/Tingling: Yes, sensory change in hands and feet from neuropathy Focal Weakness: general LE weakness Recent changes in  overall health/medication: Yes, heart blockage found  Prior history of physical therapy for balance:  No Falls: Has patient fallen in last 6 months? Yes, Number of falls: 4, pt fell when riding lawn mower when ducking under limb (pt fell backward off of lawnmower). Pt fell in home 2x when bending over to grab item from floor, pt fell forward when getting out of bed.  Directional pattern for falls: Yes, forward falling when bending over  Imaging: Yes     Radiograph L knee 12/10/20 Status post total knee replacement with prosthetic components well-seated. No fracture or dislocation. Acute postoperative changes noted.       Prior level of function: Independent Occupational demands: Retired  Office manager: Media planner (jewelry, painting)    Red flags (bowel/bladder changes, saddle paresthesia, personal history of cancer, h/o spinal tumors, h/o compression fx, h/o abdominal aneurysm, abdominal pain, chills/fever, night sweats, nausea, vomiting, unrelenting pain): Positive for Hx of breast cancer (in remission), otherwise no other red flags    Weight Bearing Restrictions: No   Living Environment Lives with: lives alone, daughter lives close by  Lives in: House/apartment Has following equipment at home: Single point cane, Environmental consultant - 4 wheeled, and Grab bars Pt uses step-to pattern to get up 14 steps in home 3 steps to get into home (gets up those easily)  Step-in shower, no shower bench; pt has suction grab bar     Patient Goals: Able to walk better, improve balance    PRECAUTIONS: Fall history                                                                                                                                                                   SUBJECTIVE STATEMENT: Patient reports no major changes and no recent fall. Pt reports that she has been increasing walking in stores without her cane and has better confidence in walking in public. Pt states that she is compliant with the HEP.    PAIN:  Are you having pain? No   OBJECTIVE: (objective measures completed at initial evaluation unless otherwise dated)   Patient Surveys  FOTO: 34, predicted improvement to 57 ABC: 62.5%     Cognition Patient is oriented to person, place, and time.  Recent memory is intact.  Remote memory is intact.  Attention span and concentration are intact.  Expressive speech is intact.  Patient's fund of knowledge is within normal limits for  educational level.                            Observation Venous distension and varicose veins in bilat legs, color change. No inc tissue temperature, calf girth, pitting edema, or significant calf tenderness.     GAIT: Distance walked: 45  ft Assistive device utilized: Single point cane, RUE Level of assistance: SBA Comments: Pt walks with RLE externally rotated, wide BOS, decreased gait cadence, diminished heel strike, slow velocity    Posture: Pt rests in posterior pelvic tilt. Grossly kyphotic posture. Pes planus. Genu valgus R>L knee.      LE MMT:   MMT (out of 5) Right 10/06/2022 Left 10/06/2022  Hip flexion 4 4  Hip extension      Hip abduction 5 5  Hip adduction 5 5  Hip internal rotation      Hip external rotation      Knee flexion 5 5  Knee extension 5 5  Ankle dorsiflexion 4 5  Ankle plantarflexion      Ankle inversion      Ankle eversion      (* = pain; Blank rows = not tested)     Sensation Decreased light touch sensation in bilateral toes and dorsum of feet     Reflexes Deferred     Cranial Nerves Visual acuity and visual fields are intact  Extraocular muscles are intact; pt does have difficulty tracking vertically from top of visual field back to neutral position  Facial sensation is intact bilaterally  Facial strength is intact bilaterally  Hearing is normal as tested by gross conversation Palate elevates midline, normal phonation  Shoulder shrug strength is intact  Tongue protrudes midline     Coordination/Cerebellar Finger to Nose: WNL Heel to Shin: WNL Rapid alternating movements: WNL Finger Opposition: WNL Pronator Drift: Negative     FUNCTIONAL OUTCOME MEASURES     Results Comments  BERG 10/08/22: 48/56 Fall risk, in need of intervention  TUG 10/06/22: 19.5 seconds     5TSTS 10/06/22: 17 seconds    DGI 10/08/22: 11/24    10 Meter Gait Speed 10/08/22: Self-selected: s = 0.59 m/s; Fastest: s = 0.69 m/s Below normative values for full  community ambulation  (Blank rows = not tested)          TODAY'S TREATMENT      Neuromuscular Re-education - for improved sensory integration, static and dynamic postural control, equilibrium and non-equilibrium coordination as needed for negotiating home and community environment and stepping over obstacles  In // bars:   Standing in semi-tandem with postural perturbations, multi-directional; 4 x 1 minute  Cone reaching, pt reaching outside of BOS to challenge static postural control; 3x9 cones for increased forward and cross-body reach   Forward stepping over hurdles with a step-to pattern, (2) 6-inch hurdles and 1 6 inch step; 5x D/B  Standing in semi-tandem on Airex; 4x1 minute each position (RLE in back and LLE in back)   *not today* Tandem standing on floor; 2x30 sec each position (RLE in back and LLE in back) Toe tapping, with bilat standing; alternating R/L 2x10 on 6-inch step with Airex pad on top - for fleeting unipedal stance as needed for obstacle negotiation  Sit to stand with Airex under feet; additional Airex on chair to increase chair height; performed 2x10  Standing single hurdle step; 2x10 with each LE, over dumbbell on floor for visual target   -no UE support Tagging sticky notes on wall with ball; multi-directional reaching requiring bending/leaning; x 4 minutes - for forward reaching and having trunk stability while doing so. Balloon tapping for dynamic postural control and external perturbation; x 3 minutes  Airex pad standing march; 2x10 alternating Step up/down 3 consecutive Airex pads; 2x D/B length of parallel bars  -verbal cueing to limit  pushing posterior thigh on edge of chair Functional reaching with cones; 10 cones on adjacent table; 2 trials of forward reaching and cross-body reach to place cone down with each upper limb   -for postural control with forward leaning and reaching tasks    Therapeutic Exercise - improved strength as needed to  improve performance of CKC activities/functional movements and as needed for power production to prevent fall during episode of large postural perturbation, completion of performance-based outcome measures to detect fall risk and track progress   Forward/backward stepping; 5x D/B length of bars; verbal cueing to limit toe-out and heel strike  Sidestepping with Blue Tband just superior to bilat patellae; 2x4 D/B length // bars   *not today* NuStep; x 5 minutes with Level 4 resistance, seat at 8 - for LE strengthening and lower limb mobility   -subjective information gathered during this time, 2 minutes unbilled  High knees 5x D/B  length of bars Squat to butt tap on edge of chair, with Airex on chair for increased seat height; 2x10  -verbal cueing and demo to reduce weightbearing on ischial tuberosities at bottom of squat  Standing heel raise/toe raise; x10 alternating Standing march with light touch in single bar, in // bars; x10 alternating Sit to stand, reviewed      PATIENT EDUCATION:  Education details: see above for patient education details Person educated: Patient Education method: Explanation, Demonstration, and Handouts Education comprehension: verbalized understanding     HOME EXERCISE PROGRAM: Access Code: [email or web address] URL: [email or web address] Date: 10/12/2022 Prepared by: Valentina Gu  Exercises - Sit to Stand Without Arm Support  - 2 x daily - 7 x weekly - 2 sets - 10 reps - Heel Toe Raises with Unilateral Counter Support  - 2 x daily - 7 x weekly - 2 sets - 10 reps - Standing March with Counter Support  - 2 x daily - 7 x weekly - 2 sets - 10 reps      ASSESSMENT:   CLINICAL IMPRESSION: Patient demonstrated moderate difficulty cone reaching activity and was hesitant with distance but became more comfortable with the increase in repetitions. Patient did well with 6 inch step and two 6 inch hurdles with minimal need for UE support. Pt had some  difficulty with stepping down from the step.  Patient demonstrates excellent ability to complete righting reactions when responding to moderate postural perturbation in various positions. Pt is also limited by comorbid knee OA which can limit sit to stand performance. Patient has remaining deficits in impaired postural control, gait changes, difficulty with obstacle negotiation, hip and trunk control needed to prevent forward LOB with bending over/reaching to low-lying item. Patient will benefit from skilled PT to address above impairments and improve overall function.    REHAB POTENTIAL: Good   CLINICAL DECISION MAKING: Evolving/moderate complexity   EVALUATION COMPLEXITY: Moderate     GOALS:   SHORT TERM GOALS: Target date: 10/30/2022   Pt will be independent with HEP in order to improve strength and balance in order to decrease fall risk and improve function at home. Baseline: 10/06/22: Baseline HEP to be initiated next visit.   11/17/22: Pt is compliant with HEP Goal status: ACHIEVED     LONG TERM GOALS: Target date: 12/04/2022   Pt will increase FOTO to at least 57 to demonstrate significant improvement in function at home related to balance  Baseline: 10/06/22: 54.   11/17/22: 48.   Goal status: NOT MET    2.  Pt will improve  BERG by at least 3 points in order to demonstrate clinically significant improvement in balance.   Baseline: 10/06/22: Will complete next visit     10/08/22: 48/56    11/17/22: 48/56  Goal status: NOT MET   3.  Pt will improve ABC by at least 13% in order to demonstrate clinically significant improvement in balance confidence.      Baseline: 10/06/22: Will complete next visit     10/08/22: 62.5%.   11/17/22: 63.1% Goal status: NOT MET    4. Pt will decrease 5TSTS by at least 3 seconds in order to demonstrate clinically significant improvement in LE strength      Baseline: 10/06/22: 17 seconds.   11/17/22: 14.4 seconds.  Goal status: IN PROGRESS    5. Pt will  improve DGI by at least 3 points in order to demonstrate clinically significant improvement in balance and decreased risk for falls.     Baseline: 10/06/22: Baseline to be completed next visit     10/08/22: 11/24.   11/17/22: 14/24 Goal status: ACHIEVED   6. Pt will decrease TUG to below 14 seconds/decrease in order to demonstrate decreased fall risk.  Baseline: 10/06/22: 19.5 sec.   11/17/22: 16.3 sec Goal status: IN PROGRESS         PLAN: PT FREQUENCY: 2x/week   PT DURATION: 4 weeks   PLANNED INTERVENTIONS: Therapeutic exercises, Therapeutic activity, Neuromuscular re-education, Balance training, Gait training, Patient/Family education,    PLAN FOR NEXT SESSION: Continue with task practice for stepping with sound heel strike technique, postural control training, obstacle negotiation; strategies to improve ability to reach low to floor including squatting and functional reaching. Continue with gait stability training. Recommend continued PT 2x/week for 4 weeks.    Valentina Gu, PT, DPT #[id number]  Tamera Reason, SPT

## 2022-12-08 ENCOUNTER — Encounter: Payer: Self-pay | Admitting: Physical Therapy

## 2022-12-08 ENCOUNTER — Ambulatory Visit: Payer: PPO | Attending: Nurse Practitioner | Admitting: Physical Therapy

## 2022-12-08 DIAGNOSIS — R2689 Other abnormalities of gait and mobility: Secondary | ICD-10-CM | POA: Insufficient documentation

## 2022-12-08 DIAGNOSIS — M6281 Muscle weakness (generalized): Secondary | ICD-10-CM | POA: Diagnosis not present

## 2022-12-08 DIAGNOSIS — R262 Difficulty in walking, not elsewhere classified: Secondary | ICD-10-CM | POA: Insufficient documentation

## 2022-12-08 NOTE — Therapy (Signed)
OUTPATIENT PHYSICAL THERAPY TREATMENT    Patient Name: Laurie Roy MRN: [id number] DOB:21-Dec-1940, 82 y.o., female Today's Date: 12/08/2022   PCP: Sallee Lange, NP REFERRING PROVIDER: Sallee Lange, NP  END OF SESSION:   PT End of Session - 12/08/22 1637     Visit Number 15    Number of Visits 17    Date for PT Re-Evaluation 01/01/23    Progress Note Due on Visit 10    PT Start Time 1419    PT Stop Time 1503    PT Time Calculation (min) 44 min    Equipment Utilized During Treatment Gait belt    Activity Tolerance Patient tolerated treatment well    Behavior During Therapy WFL for tasks assessed/performed              Past Medical History:  Diagnosis Date   Breast cancer ([place]) 1740   Complication of anesthesia    Heart murmur    Personal history of chemotherapy    Personal history of radiation therapy    PONV (postoperative nausea and vomiting)    Past Surgical History:  Procedure Laterality Date   APPLICATION OF WOUND VAC Left 12/10/2020   Procedure: APPLICATION OF PREVENA  WOUND VAC;  Surgeon: Dereck Leep, MD;  Location: [facility];  Service: Orthopedics;  Laterality: Left;   COLONOSCOPY     KNEE ARTHROPLASTY Left 12/10/2020   Procedure: COMPUTER ASSISTED TOTAL KNEE ARTHROPLASTY;  Surgeon: Dereck Leep, MD;  Location: [facility];  Service: Orthopedics;  Laterality: Left;   LEFT HEART CATH AND CORONARY ANGIOGRAPHY Left 04/16/2021   Procedure: LEFT HEART CATH AND CORONARY ANGIOGRAPHY;  Surgeon: Corey Skains, MD;  Location: [facility];  Service: Cardiovascular;  Laterality: Left;   MASTECTOMY Left 2002   Patient Active Problem List   Diagnosis Date Noted   Angina pectoris ([place]) 04/16/2021   IGT (impaired glucose tolerance) 12/10/2020   Neuropathy 12/10/2020   Obesity (BMI 30-39.9) 12/10/2020   Primary osteoarthritis of left knee 09/16/2020   Primary osteoarthritis of right knee 09/16/2020   Polyarthralgia 03/26/2017   Vitamin  B12 deficiency 02/01/2017   Vitamin D deficiency 02/01/2017   HTN (hypertension) 02/17/2014   Hypercholesterolemia 02/17/2014    REFERRING DIAG:  G62.9 (ICD-10-CM) - Polyneuropathy, unspecified  R26.89 (ICD-10-CM) - Other abnormalities of gait and mobility    THERAPY DIAG:  Imbalance  Difficulty in walking, not elsewhere classified  Muscle weakness (generalized)  Rationale for Evaluation and Treatment Rehabilitation  PERTINENT HISTORY: Patient is an 82 year old female referred for balance and gait deficits with Hx of longstanding bilateral peripheral neuropathy, hx of L TKA (12/10/2020) and R knee pain secondary to OA. Patient reports SOB following panic attack during her L TKA rehab. Pt uses CPAP at night now for sleep apnea. She reports small heart blockage after that episode. She reports having increased difficulty walking during this workup with cardiology. Pt reports increased fluid accumulation from 230 lbs with her weight now being 179 lbs. Patient reports difficulty with walking at this time - she uses walker when going outside usually, especially on uneven ground. Pt finds herself walking without AD in home sometimes. Hx of vascular disease and peripheral neuropathy. Pt is notably challenged with fear of falling. Hx of dyspnea on exertion    Pain: Yes, R knee; doing better following cortisone injection  Numbness/Tingling: Yes, sensory change in hands and feet from neuropathy Focal Weakness: general LE weakness Recent changes in overall health/medication: Yes, heart  blockage found  Prior history of physical therapy for balance:  No Falls: Has patient fallen in last 6 months? Yes, Number of falls: 4, pt fell when riding lawn mower when ducking under limb (pt fell backward off of lawnmower). Pt fell in home 2x when bending over to grab item from floor, pt fell forward when getting out of bed.  Directional pattern for falls: Yes, forward falling when bending over  Imaging: Yes     Radiograph L knee 12/10/20 Status post total knee replacement with prosthetic components well-seated. No fracture or dislocation. Acute postoperative changes noted.       Prior level of function: Independent Occupational demands: Retired  Office manager: Media planner (jewelry, painting)    Red flags (bowel/bladder changes, saddle paresthesia, personal history of cancer, h/o spinal tumors, h/o compression fx, h/o abdominal aneurysm, abdominal pain, chills/fever, night sweats, nausea, vomiting, unrelenting pain): Positive for Hx of breast cancer (in remission), otherwise no other red flags    Weight Bearing Restrictions: No   Living Environment Lives with: lives alone, daughter lives close by  Lives in: House/apartment Has following equipment at home: Single point cane, Environmental consultant - 4 wheeled, and Grab bars Pt uses step-to pattern to get up 14 steps in home 3 steps to get into home (gets up those easily)  Step-in shower, no shower bench; pt has suction grab bar     Patient Goals: Able to walk better, improve balance    PRECAUTIONS: Fall history                                                                                                                                                                   SUBJECTIVE STATEMENT: Patient reports that she tried taking a step down off a curb and did not have difficulty with that but still is hesitant to do that because of fear of falling. Patient reports no major changes and no recent fall. Pt states that she had pain in her shoulders and knee this past weekend and would like to talk to her doctor about medications that could help.   PAIN:  Are you having pain? No   OBJECTIVE: (objective measures completed at initial evaluation unless otherwise dated)   Patient Surveys  FOTO: 63, predicted improvement to 56 ABC: 62.5%     Cognition Patient is oriented to person, place, and time.  Recent memory is intact.  Remote memory is intact.  Attention  span and concentration are intact.  Expressive speech is intact.  Patient's fund of knowledge is within normal limits for educational level.                            Observation Venous distension and varicose veins in bilat  legs, color change. No inc tissue temperature, calf girth, pitting edema, or significant calf tenderness.     GAIT: Distance walked: 45 ft Assistive device utilized: Single point cane, RUE Level of assistance: SBA Comments: Pt walks with RLE externally rotated, wide BOS, decreased gait cadence, diminished heel strike, slow velocity    Posture: Pt rests in posterior pelvic tilt. Grossly kyphotic posture. Pes planus. Genu valgus R>L knee.      LE MMT:   MMT (out of 5) Right 10/06/2022 Left 10/06/2022  Hip flexion 4 4  Hip extension      Hip abduction 5 5  Hip adduction 5 5  Hip internal rotation      Hip external rotation      Knee flexion 5 5  Knee extension 5 5  Ankle dorsiflexion 4 5  Ankle plantarflexion      Ankle inversion      Ankle eversion      (* = pain; Blank rows = not tested)     Sensation Decreased light touch sensation in bilateral toes and dorsum of feet     Reflexes Deferred     Cranial Nerves Visual acuity and visual fields are intact  Extraocular muscles are intact; pt does have difficulty tracking vertically from top of visual field back to neutral position  Facial sensation is intact bilaterally  Facial strength is intact bilaterally  Hearing is normal as tested by gross conversation Palate elevates midline, normal phonation  Shoulder shrug strength is intact  Tongue protrudes midline     Coordination/Cerebellar Finger to Nose: WNL Heel to Shin: WNL Rapid alternating movements: WNL Finger Opposition: WNL Pronator Drift: Negative     FUNCTIONAL OUTCOME MEASURES     Results Comments  BERG 10/08/22: 48/56 Fall risk, in need of intervention  TUG 10/06/22: 19.5 seconds     5TSTS 10/06/22: 17 seconds    DGI  10/08/22: 11/24    10 Meter Gait Speed 10/08/22: Self-selected: s = 0.59 m/s; Fastest: s = 0.69 m/s Below normative values for full community ambulation  (Blank rows = not tested)          TODAY'S TREATMENT      Neuromuscular Re-education - for improved sensory integration, static and dynamic postural control, equilibrium and non-equilibrium coordination as needed for negotiating home and community environment and stepping over obstacles  In // bars:   Cone reaching, pt reaching outside of BOS to challenge static postural control; 3x9 cones for increased forward and cross-body reach   Forward stepping over hurdles with a step-to pattern, (3) 6-inch hurdles 5x D/B  Standing in semi-tandem on Airex; 1 minute each position (RLE in back and LLE in back)   *not today* Standing in semi-tandem with postural perturbations, multi-directional; 4 x 1 minute Tandem standing on floor; 2x30 sec each position (RLE in back and LLE in back) Toe tapping, with bilat standing; alternating R/L 2x10 on 6-inch step with Airex pad on top - for fleeting unipedal stance as needed for obstacle negotiation  Sit to stand with Airex under feet; additional Airex on chair to increase chair height; performed 2x10 Standing single hurdle step; 2x10 with each LE, over dumbbell on floor for visual target   -no UE support Tagging sticky notes on wall with ball; multi-directional reaching requiring bending/leaning; x 4 minutes - for forward reaching and having trunk stability while doing so. Balloon tapping for dynamic postural control and external perturbation; x 3 minutes  Airex pad standing march; 2x10 alternating Step  up/down 3 consecutive Airex pads; 2x D/B length of parallel bars  -verbal cueing to limit pushing posterior thigh on edge of chair Functional reaching with cones; 10 cones on adjacent table; 2 trials of forward reaching and cross-body reach to place cone down with each upper limb   -for postural  control with forward leaning and reaching tasks    Therapeutic Exercise - improved strength as needed to improve performance of CKC activities/functional movements and as needed for power production to prevent fall during episode of large postural perturbation, completion of performance-based outcome measures to detect fall risk and track progress   NuStep; x 5 minutes with Level 4 resistance, seat at 8 - for LE strengthening and lower limb mobility   -subjective information gathered during this time, 2 minutes unbilled   Forward/backward stepping; 5x D/B length of bars; verbal cueing to limit toe-out and heel strike  Squat to butt tap on edge of chair, with Airex on chair for increased seat height; 2x10  -verbal cueing and demo to reduce weightbearing on ischial tuberosities at bottom of squat     *not today* Sidestepping with Blue Tband just superior to bilat patellae; 2x4 D/B length // bars High knees 5x D/B  length of bars  Standing heel raise/toe raise; x10 alternating Standing march with light touch in single bar, in // bars; x10 alternating Sit to stand, reviewed      PATIENT EDUCATION:  Education details: see above for patient education details Person educated: Patient Education method: Explanation, Demonstration, and Handouts Education comprehension: verbalized understanding     HOME EXERCISE PROGRAM: Access Code: [email or web address] URL: [email or web address] Date: 10/12/2022 Prepared by: Valentina Gu  Exercises - Sit to Stand Without Arm Support  - 2 x daily - 7 x weekly - 2 sets - 10 reps - Heel Toe Raises with Unilateral Counter Support  - 2 x daily - 7 x weekly - 2 sets - 10 reps - Standing March with Counter Support  - 2 x daily - 7 x weekly - 2 sets - 10 reps      ASSESSMENT:   CLINICAL IMPRESSION: Patient demonstrated moderate difficulty cone reaching activity and became more comfortable with bending her knees for further reach. Patient did well  with 6 inch hurdles and used minimal UE support with step-to pattern. Patient demonstrates excellent ability to complete righting reactions when stepping over hurdles. Pt is also limited by comorbid knee OA which can limit sit to stand performance. Patient has remaining deficits in impaired postural control, gait changes, difficulty with obstacle negotiation, hip and trunk control needed to prevent forward LOB with bending over/reaching to low-lying item. Patient will benefit from skilled PT to address above impairments and improve overall function.    REHAB POTENTIAL: Good   CLINICAL DECISION MAKING: Evolving/moderate complexity   EVALUATION COMPLEXITY: Moderate     GOALS:   SHORT TERM GOALS: Target date: 10/30/2022   Pt will be independent with HEP in order to improve strength and balance in order to decrease fall risk and improve function at home. Baseline: 10/06/22: Baseline HEP to be initiated next visit.   11/17/22: Pt is compliant with HEP Goal status: ACHIEVED     LONG TERM GOALS: Target date: 12/04/2022   Pt will increase FOTO to at least 57 to demonstrate significant improvement in function at home related to balance  Baseline: 10/06/22: 54.   11/17/22: 48.   Goal status: NOT MET    2.  Pt will improve BERG by at  least 3 points in order to demonstrate clinically significant improvement in balance.   Baseline: 10/06/22: Will complete next visit     10/08/22: 48/56    11/17/22: 48/56  Goal status: NOT MET   3.  Pt will improve ABC by at least 13% in order to demonstrate clinically significant improvement in balance confidence.      Baseline: 10/06/22: Will complete next visit     10/08/22: 62.5%.   11/17/22: 63.1% Goal status: NOT MET    4. Pt will decrease 5TSTS by at least 3 seconds in order to demonstrate clinically significant improvement in LE strength      Baseline: 10/06/22: 17 seconds.   11/17/22: 14.4 seconds.  Goal status: IN PROGRESS    5. Pt will improve DGI by at least 3  points in order to demonstrate clinically significant improvement in balance and decreased risk for falls.     Baseline: 10/06/22: Baseline to be completed next visit     10/08/22: 11/24.   11/17/22: 14/24 Goal status: ACHIEVED   6. Pt will decrease TUG to below 14 seconds/decrease in order to demonstrate decreased fall risk.  Baseline: 10/06/22: 19.5 sec.   11/17/22: 16.3 sec Goal status: IN PROGRESS         PLAN: PT FREQUENCY: 2x/week   PT DURATION: 4 weeks   PLANNED INTERVENTIONS: Therapeutic exercises, Therapeutic activity, Neuromuscular re-education, Balance training, Gait training, Patient/Family education,    PLAN FOR NEXT SESSION: Continue with task practice for stepping with sound heel strike technique, postural control training, obstacle negotiation; strategies to improve ability to reach low to floor including squatting and functional reaching. Continue with gait stability training. Recommend continued PT 2x/week for 4 weeks.    Valentina Gu, PT, DPT #[id number]  Tamera Reason, SPT 12/08/2022 5:35 PM

## 2022-12-10 ENCOUNTER — Encounter: Payer: Self-pay | Admitting: Physical Therapy

## 2022-12-10 ENCOUNTER — Ambulatory Visit: Payer: PPO | Admitting: Physical Therapy

## 2022-12-10 DIAGNOSIS — R2689 Other abnormalities of gait and mobility: Secondary | ICD-10-CM

## 2022-12-10 DIAGNOSIS — R262 Difficulty in walking, not elsewhere classified: Secondary | ICD-10-CM

## 2022-12-10 DIAGNOSIS — M6281 Muscle weakness (generalized): Secondary | ICD-10-CM

## 2022-12-10 NOTE — Therapy (Addendum)
OUTPATIENT PHYSICAL THERAPY TREATMENT    Patient Name: Laurie Roy MRN: [id number] DOB:10-23-1941, 82 y.o., female Today's Date: 12/10/2022   PCP: Sallee Lange, NP REFERRING PROVIDER: Sallee Lange, NP  END OF SESSION:   PT End of Session - 12/10/22 1542     Visit Number 16    Number of Visits 20    Date for PT Re-Evaluation 01/01/23    Progress Note Due on Visit 10    PT Start Time 1433    PT Stop Time 1515    PT Time Calculation (min) 42 min    Equipment Utilized During Treatment Gait belt    Activity Tolerance Patient tolerated treatment well    Behavior During Therapy WFL for tasks assessed/performed               Past Medical History:  Diagnosis Date   Breast cancer ([place]) 6568   Complication of anesthesia    Heart murmur    Personal history of chemotherapy    Personal history of radiation therapy    PONV (postoperative nausea and vomiting)    Past Surgical History:  Procedure Laterality Date   APPLICATION OF WOUND VAC Left 12/10/2020   Procedure: APPLICATION OF PREVENA  WOUND VAC;  Surgeon: Dereck Leep, MD;  Location: [facility];  Service: Orthopedics;  Laterality: Left;   COLONOSCOPY     KNEE ARTHROPLASTY Left 12/10/2020   Procedure: COMPUTER ASSISTED TOTAL KNEE ARTHROPLASTY;  Surgeon: Dereck Leep, MD;  Location: [facility];  Service: Orthopedics;  Laterality: Left;   LEFT HEART CATH AND CORONARY ANGIOGRAPHY Left 04/16/2021   Procedure: LEFT HEART CATH AND CORONARY ANGIOGRAPHY;  Surgeon: Corey Skains, MD;  Location: [facility];  Service: Cardiovascular;  Laterality: Left;   MASTECTOMY Left 2002   Patient Active Problem List   Diagnosis Date Noted   Angina pectoris ([place]) 04/16/2021   IGT (impaired glucose tolerance) 12/10/2020   Neuropathy 12/10/2020   Obesity (BMI 30-39.9) 12/10/2020   Primary osteoarthritis of left knee 09/16/2020   Primary osteoarthritis of right knee 09/16/2020   Polyarthralgia 03/26/2017   Vitamin  B12 deficiency 02/01/2017   Vitamin D deficiency 02/01/2017   HTN (hypertension) 02/17/2014   Hypercholesterolemia 02/17/2014    REFERRING DIAG:  G62.9 (ICD-10-CM) - Polyneuropathy, unspecified  R26.89 (ICD-10-CM) - Other abnormalities of gait and mobility    THERAPY DIAG:  Imbalance  Difficulty in walking, not elsewhere classified  Muscle weakness (generalized)  Rationale for Evaluation and Treatment Rehabilitation  PERTINENT HISTORY: Patient is an 82 year old female referred for balance and gait deficits with Hx of longstanding bilateral peripheral neuropathy, hx of L TKA (12/10/2020) and R knee pain secondary to OA. Patient reports SOB following panic attack during her L TKA rehab. Pt uses CPAP at night now for sleep apnea. She reports small heart blockage after that episode. She reports having increased difficulty walking during this workup with cardiology. Pt reports increased fluid accumulation from 230 lbs with her weight now being 179 lbs. Patient reports difficulty with walking at this time - she uses walker when going outside usually, especially on uneven ground. Pt finds herself walking without AD in home sometimes. Hx of vascular disease and peripheral neuropathy. Pt is notably challenged with fear of falling. Hx of dyspnea on exertion    Pain: Yes, R knee; doing better following cortisone injection  Numbness/Tingling: Yes, sensory change in hands and feet from neuropathy Focal Weakness: general LE weakness Recent changes in overall health/medication: Yes,  heart blockage found  Prior history of physical therapy for balance:  No Falls: Has patient fallen in last 6 months? Yes, Number of falls: 4, pt fell when riding lawn mower when ducking under limb (pt fell backward off of lawnmower). Pt fell in home 2x when bending over to grab item from floor, pt fell forward when getting out of bed.  Directional pattern for falls: Yes, forward falling when bending over  Imaging: Yes     Radiograph L knee 12/10/20 Status post total knee replacement with prosthetic components well-seated. No fracture or dislocation. Acute postoperative changes noted.       Prior level of function: Independent Occupational demands: Retired  Office manager: Media planner (jewelry, painting)    Red flags (bowel/bladder changes, saddle paresthesia, personal history of cancer, h/o spinal tumors, h/o compression fx, h/o abdominal aneurysm, abdominal pain, chills/fever, night sweats, nausea, vomiting, unrelenting pain): Positive for Hx of breast cancer (in remission), otherwise no other red flags    Weight Bearing Restrictions: No   Living Environment Lives with: lives alone, daughter lives close by  Lives in: House/apartment Has following equipment at home: Single point cane, Environmental consultant - 4 wheeled, and Grab bars Pt uses step-to pattern to get up 14 steps in home 3 steps to get into home (gets up those easily)  Step-in shower, no shower bench; pt has suction grab bar     Patient Goals: Able to walk better, improve balance    PRECAUTIONS: Fall history                                                                                                                                                                   SUBJECTIVE STATEMENT: Patient reports no major changes or incidence recently. Pt states that she has been doing a deep clean of her house and had to lift her cat food from off the ground and felt confidence in doing that activity.    PAIN:  Are you having pain? No   OBJECTIVE: (objective measures completed at initial evaluation unless otherwise dated)   Patient Surveys  FOTO: 13, predicted improvement to 68 ABC: 62.5%     Cognition Patient is oriented to person, place, and time.  Recent memory is intact.  Remote memory is intact.  Attention span and concentration are intact.  Expressive speech is intact.  Patient's fund of knowledge is within normal limits for educational level.                             Observation Venous distension and varicose veins in bilat legs, color change. No inc tissue temperature, calf girth, pitting edema, or significant calf tenderness.     GAIT: Distance walked: 45 ft Assistive device  utilized: Single point cane, RUE Level of assistance: SBA Comments: Pt walks with RLE externally rotated, wide BOS, decreased gait cadence, diminished heel strike, slow velocity    Posture: Pt rests in posterior pelvic tilt. Grossly kyphotic posture. Pes planus. Genu valgus R>L knee.      LE MMT:   MMT (out of 5) Right 10/06/2022 Left 10/06/2022  Hip flexion 4 4  Hip extension      Hip abduction 5 5  Hip adduction 5 5  Hip internal rotation      Hip external rotation      Knee flexion 5 5  Knee extension 5 5  Ankle dorsiflexion 4 5  Ankle plantarflexion      Ankle inversion      Ankle eversion      (* = pain; Blank rows = not tested)     Sensation Decreased light touch sensation in bilateral toes and dorsum of feet     Reflexes Deferred     Cranial Nerves Visual acuity and visual fields are intact  Extraocular muscles are intact; pt does have difficulty tracking vertically from top of visual field back to neutral position  Facial sensation is intact bilaterally  Facial strength is intact bilaterally  Hearing is normal as tested by gross conversation Palate elevates midline, normal phonation  Shoulder shrug strength is intact  Tongue protrudes midline     Coordination/Cerebellar Finger to Nose: WNL Heel to Shin: WNL Rapid alternating movements: WNL Finger Opposition: WNL Pronator Drift: Negative     FUNCTIONAL OUTCOME MEASURES     Results Comments  BERG 10/08/22: 48/56 Fall risk, in need of intervention  TUG 10/06/22: 19.5 seconds     5TSTS 10/06/22: 17 seconds    DGI 10/08/22: 11/24    10 Meter Gait Speed 10/08/22: Self-selected: s = 0.59 m/s; Fastest: s = 0.69 m/s Below normative values for full community ambulation   (Blank rows = not tested)          TODAY'S TREATMENT      Neuromuscular Re-education - for improved sensory integration, static and dynamic postural control, equilibrium and non-equilibrium coordination as needed for negotiating home and community environment and stepping over obstacles  In // bars:   Forward stepping over obstacles with a step-to pattern, (2) 6-inch hurdles, (1) 6-inch step, and (1) Airex pad; 6x D/B  Standing in semi-tandem on Airex; 2x30 sec (RLE in back and LLE in back)  Standing in semi-tandem with postural perturbations, multi-directional; 4 x 1 minute (RLE in back and LLE in back)   *not today* Cone reaching, pt reaching outside of BOS to challenge static postural control; 3x9 cones for increased forward and cross-body reach  Tandem standing on floor; 2x30 sec each position (RLE in back and LLE in back) Toe tapping, with bilat standing; alternating R/L 2x10 on 6-inch step with Airex pad on top - for fleeting unipedal stance as needed for obstacle negotiation  Sit to stand with Airex under feet; additional Airex on chair to increase chair height; performed 2x10 Standing single hurdle step; 2x10 with each LE, over dumbbell on floor for visual target   -no UE support Tagging sticky notes on wall with ball; multi-directional reaching requiring bending/leaning; x 4 minutes - for forward reaching and having trunk stability while doing so. Balloon tapping for dynamic postural control and external perturbation; x 3 minutes  Airex pad standing march; 2x10 alternating Step up/down 3 consecutive Airex pads; 2x D/B length of parallel bars  -verbal cueing  to limit pushing posterior thigh on edge of chair Functional reaching with cones; 10 cones on adjacent table; 2 trials of forward reaching and cross-body reach to place cone down with each upper limb   -for postural control with forward leaning and reaching tasks    Therapeutic Exercise - improved strength as  needed to improve performance of CKC activities/functional movements and as needed for power production to prevent fall during episode of large postural perturbation, completion of performance-based outcome measures to detect fall risk and track progress   NuStep; x 5 minutes with Level 4 resistance, seat at 8 - for LE strengthening and lower limb mobility   -subjective information gathered during this time, 2 minutes unbilled   Forward/backward stepping; 5x D/B length of bars;  - verbal cueing to limit toe-out and heel strike  High knees 3x D/B  length of bars    *not today* Squat to butt tap on edge of chair, with Airex on chair for increased seat height; 2x10  -verbal cueing and demo to reduce weightbearing on ischial tuberosities at bottom of squat  Sidestepping with Blue Tband just superior to bilat patellae; 2x4 D/B length // bars  Standing heel raise/toe raise; x10 alternating Standing march with light touch in single bar, in // bars; x10 alternating Sit to stand, reviewed      PATIENT EDUCATION:  Education details: see above for patient education details Person educated: Patient Education method: Explanation, Demonstration, and Handouts Education comprehension: verbalized understanding     HOME EXERCISE PROGRAM: Access Code: [email or web address] URL: [email or web address] Date: 10/12/2022 Prepared by: Valentina Gu  Exercises - Sit to Stand Without Arm Support  - 2 x daily - 7 x weekly - 2 sets - 10 reps - Heel Toe Raises with Unilateral Counter Support  - 2 x daily - 7 x weekly - 2 sets - 10 reps - Standing March with Counter Support  - 2 x daily - 7 x weekly - 2 sets - 10 reps      ASSESSMENT:   CLINICAL IMPRESSION: Patient demonstrated moderate difficulty with stepping down from 6-inch step and was able to achieve going down with UE support on the last time going through the obstacles. Pt required using the hand rails when on the Airex pad in semi-tandem  at the 40 sec mark. Patient demonstrates excellent ability to complete righting reactions when stepping over hurdles. Pt is also limited by comorbid knee OA which can limit sit to stand performance. Patient has remaining deficits in impaired postural control, gait changes, difficulty with obstacle negotiation, hip and trunk control needed to prevent forward LOB with bending over/reaching to low-lying item. Patient will benefit from skilled PT to address above impairments and improve overall function.    REHAB POTENTIAL: Good   CLINICAL DECISION MAKING: Evolving/moderate complexity   EVALUATION COMPLEXITY: Moderate     GOALS:   SHORT TERM GOALS: Target date: 10/30/2022   Pt will be independent with HEP in order to improve strength and balance in order to decrease fall risk and improve function at home. Baseline: 10/06/22: Baseline HEP to be initiated next visit.   11/17/22: Pt is compliant with HEP Goal status: ACHIEVED     LONG TERM GOALS: Target date: 12/04/2022   Pt will increase FOTO to at least 57 to demonstrate significant improvement in function at home related to balance  Baseline: 10/06/22: 54.   11/17/22: 48.   Goal status: NOT MET    2.  Pt will improve BERG by  at least 3 points in order to demonstrate clinically significant improvement in balance.   Baseline: 10/06/22: Will complete next visit     10/08/22: 48/56    11/17/22: 48/56  Goal status: NOT MET   3.  Pt will improve ABC by at least 13% in order to demonstrate clinically significant improvement in balance confidence.      Baseline: 10/06/22: Will complete next visit     10/08/22: 62.5%.   11/17/22: 63.1% Goal status: NOT MET    4. Pt will decrease 5TSTS by at least 3 seconds in order to demonstrate clinically significant improvement in LE strength      Baseline: 10/06/22: 17 seconds.   11/17/22: 14.4 seconds.  Goal status: IN PROGRESS    5. Pt will improve DGI by at least 3 points in order to demonstrate clinically  significant improvement in balance and decreased risk for falls.     Baseline: 10/06/22: Baseline to be completed next visit     10/08/22: 11/24.   11/17/22: 14/24 Goal status: ACHIEVED   6. Pt will decrease TUG to below 14 seconds/decrease in order to demonstrate decreased fall risk.  Baseline: 10/06/22: 19.5 sec.   11/17/22: 16.3 sec Goal status: IN PROGRESS         PLAN: PT FREQUENCY: 2x/week   PT DURATION: 4 weeks   PLANNED INTERVENTIONS: Therapeutic exercises, Therapeutic activity, Neuromuscular re-education, Balance training, Gait training, Patient/Family education,    PLAN FOR NEXT SESSION: Continue with task practice for stepping with sound heel strike technique, postural control training, obstacle negotiation; strategies to improve ability to reach low to floor including squatting and functional reaching. Continue with gait stability training. Recommend continued PT 2x/week for 4 weeks.    Valentina Gu, PT, DPT #[id number]  Tamera Reason, SPT 12/10/2022 5:00 PM

## 2022-12-15 ENCOUNTER — Encounter: Payer: Self-pay | Admitting: Physical Therapy

## 2022-12-15 ENCOUNTER — Ambulatory Visit: Payer: PPO | Admitting: Physical Therapy

## 2022-12-15 DIAGNOSIS — R2689 Other abnormalities of gait and mobility: Secondary | ICD-10-CM

## 2022-12-15 DIAGNOSIS — R262 Difficulty in walking, not elsewhere classified: Secondary | ICD-10-CM

## 2022-12-15 DIAGNOSIS — M6281 Muscle weakness (generalized): Secondary | ICD-10-CM

## 2022-12-15 NOTE — Therapy (Deleted)
OUTPATIENT PHYSICAL THERAPY TREATMENT    Patient Name: Laurie Roy MRN: [id number] DOB:14-May-1941, 82 y.o., female Today's Date: 12/15/2022   PCP: Sallee Lange, NP REFERRING PROVIDER: Sallee Lange, NP  END OF SESSION:       Past Medical History:  Diagnosis Date   Breast cancer [facility]) [id number]   Complication of anesthesia    Heart murmur    Personal history of chemotherapy    Personal history of radiation therapy    PONV (postoperative nausea and vomiting)    Past Surgical History:  Procedure Laterality Date   APPLICATION OF WOUND VAC Left 12/10/2020   Procedure: APPLICATION OF PREVENA  WOUND VAC;  Surgeon: Dereck Leep, MD;  Location: [facility] ORS;  Service: Orthopedics;  Laterality: Left;   COLONOSCOPY     KNEE ARTHROPLASTY Left 12/10/2020   Procedure: COMPUTER ASSISTED TOTAL KNEE ARTHROPLASTY;  Surgeon: Dereck Leep, MD;  Location: [facility] OR[facility];  Service: Orthopedics;  Laterality: Left;   LEFT HEART CATH AND CORONARY ANGIOGRAPHY Left 04/16/2021   Procedure: LEFT HEART CATH AND CORONARY ANGIOGRAPHY;  Surgeon: Corey Skains, MD;  Location: [place] [facility];  Service: Cardiovascular;  Laterality: Left;   MASTECTOMY Left 2002   Patient Active Problem List   Diagnosis Date Noted   Angina pectoris ([place]) 04/16/2021   IGT (impaired glucose tolerance) 12/10/2020   Neuropathy 12/10/2020   Obesity (BMI 30-39.9) 12/10/2020   Primary osteoarthritis of left knee 09/16/2020   Primary osteoarthritis of right knee 09/16/2020   Polyarthralgia 03/26/2017   Vitamin B12 deficiency 02/01/2017   Vitamin D deficiency 02/01/2017   HTN (hypertension) 02/17/2014   Hypercholesterolemia 02/17/2014    REFERRING DIAG:  G62.9 (ICD-10-CM) - Polyneuropathy, unspecified  R26.89 (ICD-10-CM) - Other abnormalities of gait and mobility    THERAPY DIAG:  No diagnosis found.  Rationale for Evaluation and Treatment Rehabilitation  PERTINENT HISTORY: Patient is an 82 year old  female referred for balance and gait deficits with Hx of longstanding bilateral peripheral neuropathy, hx of L TKA (12/10/2020) and R knee pain secondary to OA. Patient reports SOB following panic attack during her L TKA rehab. Pt uses CPAP at night now for sleep apnea. She reports small heart blockage after that episode. She reports having increased difficulty walking during this workup with cardiology. Pt reports increased fluid accumulation from 230 lbs with her weight now being 179 lbs. Patient reports difficulty with walking at this time - she uses walker when going outside usually, especially on uneven ground. Pt finds herself walking without AD in home sometimes. Hx of vascular disease and peripheral neuropathy. Pt is notably challenged with fear of falling. Hx of dyspnea on exertion    Pain: Yes, R knee; doing better following cortisone injection  Numbness/Tingling: Yes, sensory change in hands and feet from neuropathy Focal Weakness: general LE weakness Recent changes in overall health/medication: Yes, heart blockage found  Prior history of physical therapy for balance:  No Falls: Has patient fallen in last 6 months? Yes, Number of falls: 4, pt fell when riding lawn mower when ducking under limb (pt fell backward off of lawnmower). Pt fell in home 2x when bending over to grab item from floor, pt fell forward when getting out of bed.  Directional pattern for falls: Yes, forward falling when bending over  Imaging: Yes    Radiograph L knee 12/10/20 Status post total knee replacement with prosthetic components well-seated. No fracture or dislocation. Acute postoperative changes noted.       Prior  level of function: Independent Occupational demands: Retired  Office manager: Media planner (jewelry, painting)    Red flags (bowel/bladder changes, saddle paresthesia, personal history of cancer, h/o spinal tumors, h/o compression fx, h/o abdominal aneurysm, abdominal pain, chills/fever, night sweats, nausea,  vomiting, unrelenting pain): Positive for Hx of breast cancer (in remission), otherwise no other red flags    Weight Bearing Restrictions: No   Living Environment Lives with: lives alone, daughter lives close by  Lives in: House/apartment Has following equipment at home: Single point cane, Environmental consultant - 4 wheeled, and Grab bars Pt uses step-to pattern to get up 14 steps in home 3 steps to get into home (gets up those easily)  Step-in shower, no shower bench; pt has suction grab bar     Patient Goals: Able to walk better, improve balance    PRECAUTIONS: Fall history                                                                                                                                                                   SUBJECTIVE STATEMENT: Patient reports no major changes or incidence recently. Pt states that she has been doing a deep clean of her house and had to lift her cat food from off the ground and felt confidence in doing that activity.    PAIN:  Are you having pain? No   OBJECTIVE: (objective measures completed at initial evaluation unless otherwise dated)   Patient Surveys  FOTO: 59, predicted improvement to 15 ABC: 62.5%     Cognition Patient is oriented to person, place, and time.  Recent memory is intact.  Remote memory is intact.  Attention span and concentration are intact.  Expressive speech is intact.  Patient's fund of knowledge is within normal limits for educational level.                            Observation Venous distension and varicose veins in bilat legs, color change. No inc tissue temperature, calf girth, pitting edema, or significant calf tenderness.     GAIT: Distance walked: 45 ft Assistive device utilized: Single point cane, RUE Level of assistance: SBA Comments: Pt walks with RLE externally rotated, wide BOS, decreased gait cadence, diminished heel strike, slow velocity    Posture: Pt rests in posterior pelvic tilt. Grossly  kyphotic posture. Pes planus. Genu valgus R>L knee.      LE MMT:   MMT (out of 5) Right 10/06/2022 Left 10/06/2022  Hip flexion 4 4  Hip extension      Hip abduction 5 5  Hip adduction 5 5  Hip internal rotation      Hip external rotation      Knee flexion 5 5  Knee extension  5 5  Ankle dorsiflexion 4 5  Ankle plantarflexion      Ankle inversion      Ankle eversion      (* = pain; Blank rows = not tested)     Sensation Decreased light touch sensation in bilateral toes and dorsum of feet     Reflexes Deferred     Cranial Nerves Visual acuity and visual fields are intact  Extraocular muscles are intact; pt does have difficulty tracking vertically from top of visual field back to neutral position  Facial sensation is intact bilaterally  Facial strength is intact bilaterally  Hearing is normal as tested by gross conversation Palate elevates midline, normal phonation  Shoulder shrug strength is intact  Tongue protrudes midline     Coordination/Cerebellar Finger to Nose: WNL Heel to Shin: WNL Rapid alternating movements: WNL Finger Opposition: WNL Pronator Drift: Negative     FUNCTIONAL OUTCOME MEASURES     Results Comments  BERG 10/08/22: 48/56 Fall risk, in need of intervention  TUG 10/06/22: 19.5 seconds     5TSTS 10/06/22: 17 seconds    DGI 10/08/22: 11/24    10 Meter Gait Speed 10/08/22: Self-selected: s = 0.59 m/s; Fastest: s = 0.69 m/s Below normative values for full community ambulation  (Blank rows = not tested)          TODAY'S TREATMENT      Neuromuscular Re-education - for improved sensory integration, static and dynamic postural control, equilibrium and non-equilibrium coordination as needed for negotiating home and community environment and stepping over obstacles  In // bars:   Forward stepping over obstacles with a step-to pattern, (2) 6-inch hurdles, (1) 6-inch step, and (1) Airex pad; 6x D/B  Standing in semi-tandem on Airex; 2x30 sec  (RLE in back and LLE in back)  Standing in semi-tandem with postural perturbations, multi-directional; 4 x 1 minute (RLE in back and LLE in back)   *not today* Cone reaching, pt reaching outside of BOS to challenge static postural control; 3x9 cones for increased forward and cross-body reach  Tandem standing on floor; 2x30 sec each position (RLE in back and LLE in back) Toe tapping, with bilat standing; alternating R/L 2x10 on 6-inch step with Airex pad on top - for fleeting unipedal stance as needed for obstacle negotiation  Sit to stand with Airex under feet; additional Airex on chair to increase chair height; performed 2x10 Standing single hurdle step; 2x10 with each LE, over dumbbell on floor for visual target   -no UE support Tagging sticky notes on wall with ball; multi-directional reaching requiring bending/leaning; x 4 minutes - for forward reaching and having trunk stability while doing so. Balloon tapping for dynamic postural control and external perturbation; x 3 minutes  Airex pad standing march; 2x10 alternating Step up/down 3 consecutive Airex pads; 2x D/B length of parallel bars  -verbal cueing to limit pushing posterior thigh on edge of chair Functional reaching with cones; 10 cones on adjacent table; 2 trials of forward reaching and cross-body reach to place cone down with each upper limb   -for postural control with forward leaning and reaching tasks    Therapeutic Exercise - improved strength as needed to improve performance of CKC activities/functional movements and as needed for power production to prevent fall during episode of large postural perturbation, completion of performance-based outcome measures to detect fall risk and track progress   NuStep; x 5 minutes with Level 4 resistance, seat at 8 - for LE strengthening and lower  limb mobility   -subjective information gathered during this time, 2 minutes unbilled   Forward/backward stepping; 5x D/B length of bars;   - verbal cueing to limit toe-out and heel strike  High knees 3x D/B  length of bars    *not today* Squat to butt tap on edge of chair, with Airex on chair for increased seat height; 2x10  -verbal cueing and demo to reduce weightbearing on ischial tuberosities at bottom of squat  Sidestepping with Blue Tband just superior to bilat patellae; 2x4 D/B length // bars  Standing heel raise/toe raise; x10 alternating Standing march with light touch in single bar, in // bars; x10 alternating Sit to stand, reviewed      PATIENT EDUCATION:  Education details: see above for patient education details Person educated: Patient Education method: Explanation, Demonstration, and Handouts Education comprehension: verbalized understanding     HOME EXERCISE PROGRAM: Access Code: [email or web address] URL: [email or web address] Date: 10/12/2022 Prepared by: Valentina Gu  Exercises - Sit to Stand Without Arm Support  - 2 x daily - 7 x weekly - 2 sets - 10 reps - Heel Toe Raises with Unilateral Counter Support  - 2 x daily - 7 x weekly - 2 sets - 10 reps - Standing March with Counter Support  - 2 x daily - 7 x weekly - 2 sets - 10 reps      ASSESSMENT:   CLINICAL IMPRESSION: Patient demonstrated moderate difficulty with stepping down from 6-inch step and was able to achieve going down with UE support on the last time going through the obstacles. Pt required using the hand rails when on the Airex pad in semi-tandem at the 40 sec mark. Patient demonstrates excellent ability to complete righting reactions when stepping over hurdles. Pt is also limited by comorbid knee OA which can limit sit to stand performance. Patient has remaining deficits in impaired postural control, gait changes, difficulty with obstacle negotiation, hip and trunk control needed to prevent forward LOB with bending over/reaching to low-lying item. Patient will benefit from skilled PT to address above impairments and improve  overall function.    REHAB POTENTIAL: Good   CLINICAL DECISION MAKING: Evolving/moderate complexity   EVALUATION COMPLEXITY: Moderate     GOALS:   SHORT TERM GOALS: Target date: 10/30/2022   Pt will be independent with HEP in order to improve strength and balance in order to decrease fall risk and improve function at home. Baseline: 10/06/22: Baseline HEP to be initiated next visit.   11/17/22: Pt is compliant with HEP Goal status: ACHIEVED     LONG TERM GOALS: Target date: 12/04/2022   Pt will increase FOTO to at least 57 to demonstrate significant improvement in function at home related to balance  Baseline: 10/06/22: 54.   11/17/22: 48.   Goal status: NOT MET    2.  Pt will improve BERG by at least 3 points in order to demonstrate clinically significant improvement in balance.   Baseline: 10/06/22: Will complete next visit     10/08/22: 48/56    11/17/22: 48/56  Goal status: NOT MET   3.  Pt will improve ABC by at least 13% in order to demonstrate clinically significant improvement in balance confidence.      Baseline: 10/06/22: Will complete next visit     10/08/22: 62.5%.   11/17/22: 63.1% Goal status: NOT MET    4. Pt will decrease 5TSTS by at least 3 seconds in order to demonstrate clinically significant improvement in LE strength  Baseline: 10/06/22: 17 seconds.   11/17/22: 14.4 seconds.  Goal status: IN PROGRESS    5. Pt will improve DGI by at least 3 points in order to demonstrate clinically significant improvement in balance and decreased risk for falls.     Baseline: 10/06/22: Baseline to be completed next visit     10/08/22: 11/24.   11/17/22: 14/24 Goal status: ACHIEVED   6. Pt will decrease TUG to below 14 seconds/decrease in order to demonstrate decreased fall risk.  Baseline: 10/06/22: 19.5 sec.   11/17/22: 16.3 sec Goal status: IN PROGRESS         PLAN: PT FREQUENCY: 2x/week   PT DURATION: 4 weeks   PLANNED INTERVENTIONS: Therapeutic exercises, Therapeutic  activity, Neuromuscular re-education, Balance training, Gait training, Patient/Family education,    PLAN FOR NEXT SESSION: Continue with task practice for stepping with sound heel strike technique, postural control training, obstacle negotiation; strategies to improve ability to reach low to floor including squatting and functional reaching. Continue with gait stability training. Recommend continued PT 2x/week for 4 weeks.    Valentina Gu, PT, DPT [place]:[id number]  Tamera Reason, SPT 12/15/2022 2:52 PM

## 2022-12-15 NOTE — Therapy (Signed)
OUTPATIENT PHYSICAL THERAPY TREATMENT    Patient Name: Laurie Roy MRN: [id number] DOB:11-11-1940, 82 y.o., female Today's Date: 12/15/2022   PCP: Sallee Lange, NP REFERRING PROVIDER: Sallee Lange, NP  END OF SESSION:   PT End of Session - 12/15/22 1520     Visit Number 17    Number of Visits 20    Date for PT Re-Evaluation 01/01/23    Progress Note Due on Visit 10    PT Start Time 1718    PT Stop Time 1802    PT Time Calculation (min) 44 min    Equipment Utilized During Treatment Gait belt    Activity Tolerance Patient tolerated treatment well    Behavior During Therapy WFL for tasks assessed/performed                Past Medical History:  Diagnosis Date   Breast cancer ([place]) [id number]   Complication of anesthesia    Heart murmur    Personal history of chemotherapy    Personal history of radiation therapy    PONV (postoperative nausea and vomiting)    Past Surgical History:  Procedure Laterality Date   APPLICATION OF WOUND VAC Left 12/10/2020   Procedure: APPLICATION OF PREVENA  WOUND VAC;  Surgeon: Dereck Leep, MD;  Location: [facility];  Service: Orthopedics;  Laterality: Left;   COLONOSCOPY     KNEE ARTHROPLASTY Left 12/10/2020   Procedure: COMPUTER ASSISTED TOTAL KNEE ARTHROPLASTY;  Surgeon: Dereck Leep, MD;  Location: [facility];  Service: Orthopedics;  Laterality: Left;   LEFT HEART CATH AND CORONARY ANGIOGRAPHY Left 04/16/2021   Procedure: LEFT HEART CATH AND CORONARY ANGIOGRAPHY;  Surgeon: Corey Skains, MD;  Location: [place] [facility];  Service: Cardiovascular;  Laterality: Left;   MASTECTOMY Left 2002   Patient Active Problem List   Diagnosis Date Noted   Angina pectoris ([place]) 04/16/2021   IGT (impaired glucose tolerance) 12/10/2020   Neuropathy 12/10/2020   Obesity (BMI 30-39.9) 12/10/2020   Primary osteoarthritis of left knee 09/16/2020   Primary osteoarthritis of right knee 09/16/2020   Polyarthralgia 03/26/2017    Vitamin B12 deficiency 02/01/2017   Vitamin D deficiency 02/01/2017   HTN (hypertension) 02/17/2014   Hypercholesterolemia 02/17/2014    REFERRING DIAG:  G62.9 (ICD-10-CM) - Polyneuropathy, unspecified  R26.89 (ICD-10-CM) - Other abnormalities of gait and mobility    THERAPY DIAG:  Imbalance  Difficulty in walking, not elsewhere classified  Muscle weakness (generalized)  Rationale for Evaluation and Treatment Rehabilitation  PERTINENT HISTORY: Patient is an 82 year old female referred for balance and gait deficits with Hx of longstanding bilateral peripheral neuropathy, hx of L TKA (12/10/2020) and R knee pain secondary to OA. Patient reports SOB following panic attack during her L TKA rehab. Pt uses CPAP at night now for sleep apnea. She reports small heart blockage after that episode. She reports having increased difficulty walking during this workup with cardiology. Pt reports increased fluid accumulation from 230 lbs with her weight now being 179 lbs. Patient reports difficulty with walking at this time - she uses walker when going outside usually, especially on uneven ground. Pt finds herself walking without AD in home sometimes. Hx of vascular disease and peripheral neuropathy. Pt is notably challenged with fear of falling. Hx of dyspnea on exertion    Pain: Yes, R knee; doing better following cortisone injection  Numbness/Tingling: Yes, sensory change in hands and feet from neuropathy Focal Weakness: general LE weakness Recent changes in overall health/medication:  Yes, heart blockage found  Prior history of physical therapy for balance:  No Falls: Has patient fallen in last 6 months? Yes, Number of falls: 4, pt fell when riding lawn mower when ducking under limb (pt fell backward off of lawnmower). Pt fell in home 2x when bending over to grab item from floor, pt fell forward when getting out of bed.  Directional pattern for falls: Yes, forward falling when bending over  Imaging: Yes     Radiograph L knee 12/10/20 Status post total knee replacement with prosthetic components well-seated. No fra cture or dislocation. Acute postoperative changes noted.       Prior level of function: Independent Occupational demands: Retired  Office manager: Media planner (jewelry, painting)    Red flags (bowel/bladder changes, saddle paresthesia, personal history of cancer, h/o spinal tumors, h/o compression fx, h/o abdominal aneurysm, abdominal pain, chills/fever, night sweats, nausea, vomiting, unrelenting pain): Positive for Hx of breast cancer (in remission), otherwise no other red flags    Weight Bearing Restrictions: No   Living Environment Lives with: lives alone, daughter lives close by  Lives in: House/apartment Has following equipment at home: Single point cane, Environmental consultant - 4 wheeled, and Grab bars Pt uses step-to pattern to get up 14 steps in home 3 steps to get into home (gets up those easily)  Step-in shower, no shower bench; pt has suction grab bar     Patient Goals: Able to walk better, improve balance    PRECAUTIONS: Fall history                                                                                                                                                                   SUBJECTIVE STATEMENT: Patient reports no major changes or incidents recently. Pt states that she has felt more confident moving around her church on Sunday.    PAIN:  Are you having pain? No   OBJECTIVE: (objective measures completed at initial evaluation unless otherwise dated)   Patient Surveys  FOTO: 51, predicted improvement to 47 ABC: 62.5%     Cognition Patient is oriented to person, place, and time.  Recent memory is intact.  Remote memory is intact.  Attention span and concentration are intact.  Expressive speech is intact.  Patient's fund of knowledge is within normal limits for educational level.                            Observation Venous distension and varicose  veins in bilat legs, color change. No inc tissue temperature, calf girth, pitting edema, or significant calf tenderness.     GAIT: Distance walked: 45 ft Assistive device utilized: Single point cane, RUE Level of assistance: SBA Comments: Pt walks with RLE externally  rotated, wide BOS, decreased gait cadence, diminished heel strike, slow velocity    Posture: Pt rests in posterior pelvic tilt. Grossly kyphotic posture. Pes planus. Genu valgus R>L knee.      LE MMT:   MMT (out of 5) Right 10/06/2022 Left 10/06/2022  Hip flexion 4 4  Hip extension      Hip abduction 5 5  Hip adduction 5 5  Hip internal rotation      Hip external rotation      Knee flexion 5 5  Knee extension 5 5  Ankle dorsiflexion 4 5  Ankle plantarflexion      Ankle inversion      Ankle eversion      (* = pain; Blank rows = not tested)     Sensation Decreased light touch sensation in bilateral toes and dorsum of feet     Reflexes Deferred     Cranial Nerves Visual acuity and visual fields are intact  Extraocular muscles are intact; pt does have difficulty tracking vertically from top of visual field back to neutral position  Facial sensation is intact bilaterally  Facial strength is intact bilaterally  Hearing is normal as tested by gross conversation Palate elevates midline, normal phonation  Shoulder shrug strength is intact  Tongue protrudes midline     Coordination/Cerebellar Finger to Nose: WNL Heel to Shin: WNL Rapid alternating movements: WNL Finger Opposition: WNL Pronator Drift: Negative     FUNCTIONAL OUTCOME MEASURES     Results Comments  BERG 10/08/22: 48/56 Fall risk, in need of intervention  TUG 10/06/22: 19.5 seconds     5TSTS 10/06/22: 17 seconds    DGI 10/08/22: 11/24    10 Meter Gait Speed 10/08/22: Self-selected: s = 0.59 m/s; Fastest: s = 0.69 m/s Below normative values for full community ambulation  (Blank rows = not tested)          TODAY'S TREATMENT       Neuromuscular Re-education - for improved sensory integration, static and dynamic postural control, equilibrium and non-equilibrium coordination as needed for negotiating home and community environment and stepping over obstacles  In // bars:   Forward stepping over obstacles with a step-to pattern, (3) 6-inch hurdles 5x D/B  - moderate cueing for decreasing circumduction and using more of her hip flexors.   Standing in semi-tandem on Airex; 2x1 minute (RLE in back and LLE in back)  Cone reaching, pt reaching outside of BOS to challenge static postural control; 3x9 cones for increased forward and cross-body reach   Sit to stand with Airex under feet; additional Airex on chair to increase chair height; performed 3x10  Multi-directional stepping on blue star marker; x4 in each direction   *not today* Standing in semi-tandem with postural perturbations, multi-directional; 4 x 1 minute (RLE in back and LLE in back) Tandem standing on floor; 2x30 sec each position (RLE in back and LLE in back) Toe tapping, with bilat standing; alternating R/L 2x10 on 6-inch step with Airex pad on top - for fleeting unipedal stance as needed for obstacle negotiation   Standing single hurdle step; 2x10 with each LE, over dumbbell on floor for visual target   -no UE support Tagging sticky notes on wall with ball; multi-directional reaching requiring bending/leaning; x 4 minutes - for forward reaching and having trunk stability while doing so. Balloon tapping for dynamic postural control and external perturbation; x 3 minutes  Airex pad standing march; 2x10 alternating Step up/down 3 consecutive Airex pads; 2x D/B length  of parallel bars  -verbal cueing to limit pushing posterior thigh on edge of chair Functional reaching with cones; 10 cones on adjacent table; 2 trials of forward reaching and cross-body reach to place cone down with each upper limb   -for postural control with forward leaning and reaching  tasks    Therapeutic Exercise - improved strength as needed to improve performance of CKC activities/functional movements and as needed for power production to prevent fall during episode of large postural perturbation, completion of performance-based outcome measures to detect fall risk and track progress   NuStep; x 5 minutes with Level 4 resistance, seat at 8 - for LE strengthening and lower limb mobility   -subjective information gathered during this time, 2 minutes unbilled   Forward/backward stepping; 5x D/B length of bars;  - verbal cueing to limit toe-out and heel strike    *not today* High knees 3x D/B  length of bars Squat to butt tap on edge of chair, with Airex on chair for increased seat height; 2x10  -verbal cueing and demo to reduce weightbearing on ischial tuberosities at bottom of squat  Sidestepping with Blue Tband just superior to bilat patellae; 2x4 D/B length // bars Standing heel raise/toe raise; x10 alternating Standing march with light touch in single bar, in // bars; x10 alternating Sit to stand, reviewed      PATIENT EDUCATION:  Education details: see above for patient education details Person educated: Patient Education method: Explanation, Demonstration, and Handouts Education comprehension: verbalized understanding     HOME EXERCISE PROGRAM: Access Code: [email or web address] URL: [email or web address] Date: 10/12/2022 Prepared by: Valentina Gu  Exercises - Sit to Stand Without Arm Support  - 2 x daily - 7 x weekly - 2 sets - 10 reps - Heel Toe Raises with Unilateral Counter Support  - 2 x daily - 7 x weekly - 2 sets - 10 reps - Standing March with Counter Support  - 2 x daily - 7 x weekly - 2 sets - 10 reps      ASSESSMENT:   CLINICAL IMPRESSION: Patient demonstrates excellent ability to complete righting reactions when stepping over hurdles. Pt demonstrated good ability with multi-directional stepping and had some difficulty with the  narrow BOS directions. Pt performed well with obstacle negotiation and required cueing for limiting hip circumduction when stepping over hurdles and using more hip flexion. Pt is also limited by comorbid knee OA which can limit sit to stand performance. Patient has remaining deficits in impaired postural control, gait changes, difficulty with obstacle negotiation, hip and trunk control needed to prevent forward LOB with bending over/reaching to low-lying item. Patient will benefit from skilled PT to address above impairments and improve overall function.    REHAB POTENTIAL: Good   CLINICAL DECISION MAKING: Evolving/moderate complexity   EVALUATION COMPLEXITY: Moderate     GOALS:   SHORT TERM GOALS: Target date: 10/30/2022   Pt will be independent with HEP in order to improve strength and balance in order to decrease fall risk and improve function at home. Baseline: 11/17/22: Pt is compliant with HEP Goal status: ACHIEVED     LONG TERM GOALS: Target date: 12/04/2022   Pt will increase FOTO to at least 57 to demonstrate significant improvement in function at home related to balance  Baseline: 10/06/22: 54.   11/17/22: 48.   Goal status: NOT MET    2.  Pt will improve BERG by at least 3 points in order to demonstrate clinically significant improvement in balance.  Baseline: 10/08/22: 48/56    11/17/22: 48/56  Goal status: NOT MET   3.  Pt will improve ABC by at least 13% in order to demonstrate clinically significant improvement in balance confidence.      Baseline:  10/08/22: 62.5%.   11/17/22: 63.1% Goal status: NOT MET    4. Pt will decrease 5TSTS by at least 3 seconds in order to demonstrate clinically significant improvement in LE strength      Baseline: 10/06/22: 17 seconds.   11/17/22: 14.4 seconds.  Goal status: IN PROGRESS    5. Pt will improve DGI by at least 3 points in order to demonstrate clinically significant improvement in balance and decreased risk for falls.     Baseline:  10/08/22: 11/24.   11/17/22: 14/24 Goal status: ACHIEVED   6. Pt will decrease TUG to below 14 seconds/decrease in order to demonstrate decreased fall risk.  Baseline: 10/06/22: 19.5 sec.   11/17/22: 16.3 sec Goal status: IN PROGRESS         PLAN: PT FREQUENCY: 2x/week   PT DURATION: 4 weeks   PLANNED INTERVENTIONS: Therapeutic exercises, Therapeutic activity, Neuromuscular re-education, Balance training, Gait training, Patient/Family education,    PLAN FOR NEXT SESSION: Continue with task practice for stepping with sound heel strike technique, postural control training, obstacle negotiation, multi-directional stepping; strategies to improve ability to reach low to floor including squatting and functional reaching. Continue with gait stability training. Introduce more stepping down motions negotiation of stairs and curbs. Recommend continued PT 2x/week for 4 weeks.    Valentina Gu, PT, DPT [phone]  Tamera Reason, SPT 12/15/2022 3:42 PM

## 2022-12-17 ENCOUNTER — Encounter: Payer: Self-pay | Admitting: Physical Therapy

## 2022-12-17 ENCOUNTER — Ambulatory Visit: Payer: PPO | Admitting: Physical Therapy

## 2022-12-17 DIAGNOSIS — R2689 Other abnormalities of gait and mobility: Secondary | ICD-10-CM | POA: Diagnosis not present

## 2022-12-17 DIAGNOSIS — R262 Difficulty in walking, not elsewhere classified: Secondary | ICD-10-CM

## 2022-12-17 DIAGNOSIS — M6281 Muscle weakness (generalized): Secondary | ICD-10-CM

## 2022-12-17 NOTE — Therapy (Signed)
OUTPATIENT PHYSICAL THERAPY TREATMENT    Patient Name: Laurie Roy MRN: [id number] DOB:12-11-1940, 82 y.o., female Today's Date: 12/17/2022   PCP: Sallee Lange, NP REFERRING PROVIDER: Sallee Lange, NP  END OF SESSION:   PT End of Session - 12/17/22 1523     Visit Number 18    Number of Visits 20    Date for PT Re-Evaluation 01/01/23    Progress Note Due on Visit 10    PT Start Time 1415    PT Stop Time 1500    PT Time Calculation (min) 45 min    Equipment Utilized During Treatment Gait belt    Activity Tolerance Patient tolerated treatment well    Behavior During Therapy WFL for tasks assessed/performed                 Past Medical History:  Diagnosis Date   Breast cancer ([place]) [id number]   Complication of anesthesia    Heart murmur    Personal history of chemotherapy    Personal history of radiation therapy    PONV (postoperative nausea and vomiting)    Past Surgical History:  Procedure Laterality Date   APPLICATION OF WOUND VAC Left 12/10/2020   Procedure: APPLICATION OF PREVENA  WOUND VAC;  Surgeon: Dereck Leep, MD;  Location: [facility];  Service: Orthopedics;  Laterality: Left;   COLONOSCOPY     KNEE ARTHROPLASTY Left 12/10/2020   Procedure: COMPUTER ASSISTED TOTAL KNEE ARTHROPLASTY;  Surgeon: Dereck Leep, MD;  Location: [facility];  Service: Orthopedics;  Laterality: Left;   LEFT HEART CATH AND CORONARY ANGIOGRAPHY Left 04/16/2021   Procedure: LEFT HEART CATH AND CORONARY ANGIOGRAPHY;  Surgeon: Corey Skains, MD;  Location: [facility];  Service: Cardiovascular;  Laterality: Left;   MASTECTOMY Left 2002   Patient Active Problem List   Diagnosis Date Noted   Angina pectoris ([place]) 04/16/2021   IGT (impaired glucose tolerance) 12/10/2020   Neuropathy 12/10/2020   Obesity (BMI 30-39.9) 12/10/2020   Primary osteoarthritis of left knee 09/16/2020   Primary osteoarthritis of right knee 09/16/2020   Polyarthralgia 03/26/2017    Vitamin B12 deficiency 02/01/2017   Vitamin D deficiency 02/01/2017   HTN (hypertension) 02/17/2014   Hypercholesterolemia 02/17/2014    REFERRING DIAG:  G62.9 (ICD-10-CM) - Polyneuropathy, unspecified  R26.89 (ICD-10-CM) - Other abnormalities of gait and mobility    THERAPY DIAG:  Imbalance  Difficulty in walking, not elsewhere classified  Muscle weakness (generalized)  Rationale for Evaluation and Treatment Rehabilitation  PERTINENT HISTORY: Patient is an 82 year old female referred for balance and gait deficits with Hx of longstanding bilateral peripheral neuropathy, hx of L TKA (12/10/2020) and R knee pain secondary to OA. Patient reports SOB following panic attack during her L TKA rehab. Pt uses CPAP at night now for sleep apnea. She reports small heart blockage after that episode. She reports having increased difficulty walking during this workup with cardiology. Pt reports increased fluid accumulation from 230 lbs with her weight now being 179 lbs. Patient reports difficulty with walking at this time - she uses walker when going outside usually, especially on uneven ground. Pt finds herself walking without AD in home sometimes. Hx of vascular disease and peripheral neuropathy. Pt is notably challenged with fear of falling. Hx of dyspnea on exertion    Pain: Yes, R knee; doing better following cortisone injection  Numbness/Tingling: Yes, sensory change in hands and feet from neuropathy Focal Weakness: general LE weakness Recent changes in overall  health/medication: Yes, heart blockage found  Prior history of physical therapy for balance:  No Falls: Has patient fallen in last 6 months? Yes, Number of falls: 4, pt fell when riding lawn mower when ducking under limb (pt fell backward off of lawnmower). Pt fell in home 2x when bending over to grab item from floor, pt fell forward when getting out of bed.  Directional pattern for falls: Yes, forward falling when bending over  Imaging: Yes     Radiograph L knee 12/10/20 Status post total knee replacement with prosthetic components well-seated. No fra cture or dislocation. Acute postoperative changes noted.       Prior level of function: Independent Occupational demands: Retired  Office manager: Media planner (jewelry, painting)    Red flags (bowel/bladder changes, saddle paresthesia, personal history of cancer, h/o spinal tumors, h/o compression fx, h/o abdominal aneurysm, abdominal pain, chills/fever, night sweats, nausea, vomiting, unrelenting pain): Positive for Hx of breast cancer (in remission), otherwise no other red flags    Weight Bearing Restrictions: No   Living Environment Lives with: lives alone, daughter lives close by  Lives in: House/apartment Has following equipment at home: Single point cane, Environmental consultant - 4 wheeled, and Grab bars Pt uses step-to pattern to get up 14 steps in home 3 steps to get into home (gets up those easily)  Step-in shower, no shower bench; pt has suction grab bar     Patient Goals: Able to walk better, improve balance    PRECAUTIONS: Fall history                                                                                                                                                                   SUBJECTIVE STATEMENT: Pt reports that she stumbled backwards and was able to correct herself and did not fall. Pt states no major changes and has been able to do well using her steps at home with no issues.     PAIN:  Are you having pain? No   OBJECTIVE: (objective measures completed at initial evaluation unless otherwise dated)   Patient Surveys  FOTO: 28, predicted improvement to 77 ABC: 62.5%     Cognition Patient is oriented to person, place, and time.  Recent memory is intact.  Remote memory is intact.  Attention span and concentration are intact.  Expressive speech is intact.  Patient's fund of knowledge is within normal limits for educational level.                             Observation Venous distension and varicose veins in bilat legs, color change. No inc tissue temperature, calf girth, pitting edema, or significant calf tenderness.     GAIT: Distance walked: 45 ft Assistive  device utilized: Single point cane, RUE Level of assistance: SBA Comments: Pt walks with RLE externally rotated, wide BOS, decreased gait cadence, diminished heel strike, slow velocity    Posture: Pt rests in posterior pelvic tilt. Grossly kyphotic posture. Pes planus. Genu valgus R>L knee.      LE MMT:   MMT (out of 5) Right 10/06/2022 Left 10/06/2022  Hip flexion 4 4  Hip extension      Hip abduction 5 5  Hip adduction 5 5  Hip internal rotation      Hip external rotation      Knee flexion 5 5  Knee extension 5 5  Ankle dorsiflexion 4 5  Ankle plantarflexion      Ankle inversion      Ankle eversion      (* = pain; Blank rows = not tested)     Sensation Decreased light touch sensation in bilateral toes and dorsum of feet     Reflexes Deferred     Cranial Nerves Visual acuity and visual fields are intact  Extraocular muscles are intact; pt does have difficulty tracking vertically from top of visual field back to neutral position  Facial sensation is intact bilaterally  Facial strength is intact bilaterally  Hearing is normal as tested by gross conversation Palate elevates midline, normal phonation  Shoulder shrug strength is intact  Tongue protrudes midline     Coordination/Cerebellar Finger to Nose: WNL Heel to Shin: WNL Rapid alternating movements: WNL Finger Opposition: WNL Pronator Drift: Negative     FUNCTIONAL OUTCOME MEASURES     Results Comments  BERG 10/08/22: 48/56 Fall risk, in need of intervention  TUG 10/06/22: 19.5 seconds     5TSTS 10/06/22: 17 seconds    DGI 10/08/22: 11/24    10 Meter Gait Speed 10/08/22: Self-selected: s = 0.59 m/s; Fastest: s = 0.69 m/s Below normative values for full community ambulation  (Blank rows = not  tested)          TODAY'S TREATMENT      Neuromuscular Re-education - for improved sensory integration, static and dynamic postural control, equilibrium and non-equilibrium coordination as needed for negotiating home and community environment and stepping over obstacles  In hallway: Forward stepping over obstacles with a step-to pattern, (3) 6-inch hurdles, Airex pad, 6-inch step, and Airex pad; 2x4 D/B 1/2 length of hallway  - moderate cueing for decreasing circumduction and using more of her hip flexors.   In parallel bars: Standing in semi-tandem with postural perturbations, multi-directional; 4 x 1 minute (RLE in back and LLE in back)   Adjacent to low mat:  Cone reaching, pt reaching outside of BOS to challenge static postural control; 2x10 cones for increased forward and cross-body reach     *not today* Sit to stand with Airex under feet; additional Airex on chair to increase chair height; performed 3x10 Multi-directional stepping on blue star marker; x4 in each direction Tandem standing on floor; 2x30 sec each position (RLE in back and LLE in back) Toe tapping, with bilat standing; alternating R/L 2x10 on 6-inch step with Airex pad on top - for fleeting unipedal stance as needed for obstacle negotiation   Standing single hurdle step; 2x10 with each LE, over dumbbell on floor for visual target   -no UE support Tagging sticky notes on wall with ball; multi-directional reaching requiring bending/leaning; x 4 minutes - for forward reaching and having trunk stability while doing so. Balloon tapping for dynamic postural control and external perturbation; x  3 minutes  Airex pad standing march; 2x10 alternating Step up/down 3 consecutive Airex pads; 2x D/B length of parallel bars  -verbal cueing to limit pushing posterior thigh on edge of chair Functional reaching with cones; 10 cones on adjacent table; 2 trials of forward reaching and cross-body reach to place cone down with  each upper limb   -for postural control with forward leaning and reaching tasks    Therapeutic Exercise - improved strength as needed to improve performance of CKC activities/functional movements and as needed for power production to prevent fall during episode of large postural perturbation, completion of performance-based outcome measures to detect fall risk and track progress   NuStep; x 5 minutes with Level 4 resistance, seat at 8 - for LE strengthening and lower limb mobility   -subjective information gathered during this time, 2 minutes unbilled   Forward/backward stepping; 5x D/B length of bars;  - verbal cueing to limit toe-out and heel strike    *not today* High knees 3x D/B  length of bars Squat to butt tap on edge of chair, with Airex on chair for increased seat height; 2x10  -verbal cueing and demo to reduce weightbearing on ischial tuberosities at bottom of squat  Sidestepping with Blue Tband just superior to bilat patellae; 2x4 D/B length // bars Standing heel raise/toe raise; x10 alternating Standing march with light touch in single bar, in // bars; x10 alternating Sit to stand, reviewed      PATIENT EDUCATION:  Education details: see above for patient education details Person educated: Patient Education method: Explanation, Demonstration, and Handouts Education comprehension: verbalized understanding     HOME EXERCISE PROGRAM: Access Code: [email or web address] URL: [email or web address] Date: 10/12/2022 Prepared by: Valentina Gu  Exercises - Sit to Stand Without Arm Support  - 2 x daily - 7 x weekly - 2 sets - 10 reps - Heel Toe Raises with Unilateral Counter Support  - 2 x daily - 7 x weekly - 2 sets - 10 reps - Standing March with Counter Support  - 2 x daily - 7 x weekly - 2 sets - 10 reps      ASSESSMENT:   CLINICAL IMPRESSION: Patient demonstrates excellent ability to complete righting reactions when stepping over hurdles. Pt became fatigued  on the last two D/B of the obstacle steps and led to decrease in balance. Pt performed well with obstacle negotiation and required cueing for limiting hip circumduction when stepping over hurdles and using more hip flexion. Pt performed well with perturbations but continues to need improvement with the backward direction corrections.  is also limited by comorbid knee OA which can limit sit to stand performance. Patient has remaining deficits in impaired postural control, gait changes, difficulty with obstacle negotiation, hip and trunk control needed to prevent forward LOB with bending over/reaching to low-lying item. Patient will benefit from skilled PT to address above impairments and improve overall function.    REHAB POTENTIAL: Good   CLINICAL DECISION MAKING: Evolving/moderate complexity   EVALUATION COMPLEXITY: Moderate     GOALS:   SHORT TERM GOALS: Target date: 10/30/2022   Pt will be independent with HEP in order to improve strength and balance in order to decrease fall risk and improve function at home. Baseline: 11/17/22: Pt is compliant with HEP Goal status: ACHIEVED     LONG TERM GOALS: Target date: 12/04/2022   Pt will increase FOTO to at least 57 to demonstrate significant improvement in function at home related to balance  Baseline: 10/06/22:  54.   11/17/22: 48.   Goal status: NOT MET    2.  Pt will improve BERG by at least 3 points in order to demonstrate clinically significant improvement in balance.   Baseline: 10/08/22: 48/56    11/17/22: 48/56  Goal status: NOT MET   3.  Pt will improve ABC by at least 13% in order to demonstrate clinically significant improvement in balance confidence.      Baseline:  10/08/22: 62.5%.   11/17/22: 63.1% Goal status: NOT MET    4. Pt will decrease 5TSTS by at least 3 seconds in order to demonstrate clinically significant improvement in LE strength      Baseline: 10/06/22: 17 seconds.   11/17/22: 14.4 seconds.  Goal status: IN PROGRESS     5. Pt will improve DGI by at least 3 points in order to demonstrate clinically significant improvement in balance and decreased risk for falls.     Baseline: 10/08/22: 11/24.   11/17/22: 14/24 Goal status: ACHIEVED   6. Pt will decrease TUG to below 14 seconds/decrease in order to demonstrate decreased fall risk.  Baseline: 10/06/22: 19.5 sec.   11/17/22: 16.3 sec Goal status: IN PROGRESS         PLAN: PT FREQUENCY: 2x/week   PT DURATION: 4 weeks   PLANNED INTERVENTIONS: Therapeutic exercises, Therapeutic activity, Neuromuscular re-education, Balance training, Gait training, Patient/Family education,    PLAN FOR NEXT SESSION: Continue with task practice for stepping with sound heel strike technique, postural control training, obstacle negotiation, multi-directional stepping; strategies to improve ability to reach low to floor including squatting and functional reaching. Continue with gait stability training. Introduce more stepping down motions negotiation of stairs and curbs. Recommend continued PT 2x/week for 4 weeks.    Valentina Gu, PT, DPT [phone]  Tamera Reason, SPT 12/18/2022 4:16 PM

## 2022-12-22 ENCOUNTER — Ambulatory Visit: Payer: PPO | Admitting: Physical Therapy

## 2022-12-24 ENCOUNTER — Ambulatory Visit: Payer: PPO | Admitting: Physical Therapy

## 2022-12-24 ENCOUNTER — Encounter: Payer: Self-pay | Admitting: Physical Therapy

## 2022-12-24 DIAGNOSIS — R2689 Other abnormalities of gait and mobility: Secondary | ICD-10-CM | POA: Diagnosis not present

## 2022-12-24 DIAGNOSIS — M6281 Muscle weakness (generalized): Secondary | ICD-10-CM

## 2022-12-24 DIAGNOSIS — R262 Difficulty in walking, not elsewhere classified: Secondary | ICD-10-CM

## 2022-12-24 NOTE — Therapy (Signed)
OUTPATIENT PHYSICAL THERAPY TREATMENT    Patient Name: Laurie Roy MRN: [id number] DOB:29-Jul-1941, 82 y.o., female Today's Date: 12/24/2022   PCP: Sallee Lange, NP REFERRING PROVIDER: Sallee Lange, NP  END OF SESSION:   PT End of Session - 12/24/22 1536     Visit Number 19    Number of Visits 20    Date for PT Re-Evaluation 01/01/23    Progress Note Due on Visit 10    PT Start Time 1417    PT Stop Time 1502    PT Time Calculation (min) 45 min    Equipment Utilized During Treatment Gait belt    Activity Tolerance Patient tolerated treatment well    Behavior During Therapy WFL for tasks assessed/performed                  Past Medical History:  Diagnosis Date   Breast cancer ([place]) [id number]   Complication of anesthesia    Heart murmur    Personal history of chemotherapy    Personal history of radiation therapy    PONV (postoperative nausea and vomiting)    Past Surgical History:  Procedure Laterality Date   APPLICATION OF WOUND VAC Left 12/10/2020   Procedure: APPLICATION OF PREVENA  WOUND VAC;  Surgeon: Dereck Leep, MD;  Location: [facility];  Service: Orthopedics;  Laterality: Left;   COLONOSCOPY     KNEE ARTHROPLASTY Left 12/10/2020   Procedure: COMPUTER ASSISTED TOTAL KNEE ARTHROPLASTY;  Surgeon: Dereck Leep, MD;  Location: [facility];  Service: Orthopedics;  Laterality: Left;   LEFT HEART CATH AND CORONARY ANGIOGRAPHY Left 04/16/2021   Procedure: LEFT HEART CATH AND CORONARY ANGIOGRAPHY;  Surgeon: Corey Skains, MD;  Location: [facility];  Service: Cardiovascular;  Laterality: Left;   MASTECTOMY Left 2002   Patient Active Problem List   Diagnosis Date Noted   Angina pectoris ([place]) 04/16/2021   IGT (impaired glucose tolerance) 12/10/2020   Neuropathy 12/10/2020   Obesity (BMI 30-39.9) 12/10/2020   Primary osteoarthritis of left knee 09/16/2020   Primary osteoarthritis of right knee 09/16/2020   Polyarthralgia 03/26/2017    Vitamin B12 deficiency 02/01/2017   Vitamin D deficiency 02/01/2017   HTN (hypertension) 02/17/2014   Hypercholesterolemia 02/17/2014    REFERRING DIAG:  G62.9 (ICD-10-CM) - Polyneuropathy, unspecified  R26.89 (ICD-10-CM) - Other abnormalities of gait and mobility    THERAPY DIAG:  Imbalance  Difficulty in walking, not elsewhere classified  Muscle weakness (generalized)  Rationale for Evaluation and Treatment Rehabilitation  PERTINENT HISTORY: Patient is an 82 year old female referred for balance and gait deficits with Hx of longstanding bilateral peripheral neuropathy, hx of L TKA (12/10/2020) and R knee pain secondary to OA. Patient reports SOB following panic attack during her L TKA rehab. Pt uses CPAP at night now for sleep apnea. She reports small heart blockage after that episode. She reports having increased difficulty walking during this workup with cardiology. Pt reports increased fluid accumulation from 230 lbs with her weight now being 179 lbs. Patient reports difficulty with walking at this time - she uses walker when going outside usually, especially on uneven ground. Pt finds herself walking without AD in home sometimes. Hx of vascular disease and peripheral neuropathy. Pt is notably challenged with fear of falling. Hx of dyspnea on exertion    Pain: Yes, R knee; doing better following cortisone injection  Numbness/Tingling: Yes, sensory change in hands and feet from neuropathy Focal Weakness: general LE weakness Recent changes in  overall health/medication: Yes, heart blockage found  Prior history of physical therapy for balance:  No Falls: Has patient fallen in last 6 months? Yes, Number of falls: 4, pt fell when riding lawn mower when ducking under limb (pt fell backward off of lawnmower). Pt fell in home 2x when bending over to grab item from floor, pt fell forward when getting out of bed.  Directional pattern for falls: Yes, forward falling when bending over  Imaging: Yes     Radiograph L knee 12/10/20 Status post total knee replacement with prosthetic components well-seated. No fra cture or dislocation. Acute postoperative changes noted.       Prior level of function: Independent Occupational demands: Retired  Office manager: Media planner (jewelry, painting)    Red flags (bowel/bladder changes, saddle paresthesia, personal history of cancer, h/o spinal tumors, h/o compression fx, h/o abdominal aneurysm, abdominal pain, chills/fever, night sweats, nausea, vomiting, unrelenting pain): Positive for Hx of breast cancer (in remission), otherwise no other red flags    Weight Bearing Restrictions: No   Living Environment Lives with: lives alone, daughter lives close by  Lives in: House/apartment Has following equipment at home: Single point cane, Environmental consultant - 4 wheeled, and Grab bars Pt uses step-to pattern to get up 14 steps in home 3 steps to get into home (gets up those easily)  Step-in shower, no shower bench; pt has suction grab bar     Patient Goals: Able to walk better, improve balance    PRECAUTIONS: Fall history                                                                                                                                                                   SUBJECTIVE STATEMENT: Pt reports no major stumbles or incidences. Pt reports some soreness when getting out of her recliner due to knee OA. Pt states that she has been going down the stairs facing forward while holding onto her single railing more often instead of side-stepping down the stairs.    PAIN:  Are you having pain? No   OBJECTIVE: (objective measures completed at initial evaluation unless otherwise dated)   Patient Surveys  FOTO: 66, predicted improvement to 36 ABC: 62.5%     Cognition Patient is oriented to person, place, and time.  Recent memory is intact.  Remote memory is intact.  Attention span and concentration are intact.  Expressive speech is intact.  Patient's  fund of knowledge is within normal limits for educational level.                            Observation Venous distension and varicose veins in bilat legs, color change. No inc tissue temperature, calf girth, pitting edema, or significant calf  tenderness.     GAIT: Distance walked: 45 ft Assistive device utilized: Single point cane, RUE Level of assistance: SBA Comments: Pt walks with RLE externally rotated, wide BOS, decreased gait cadence, diminished heel strike, slow velocity    Posture: Pt rests in posterior pelvic tilt. Grossly kyphotic posture. Pes planus. Genu valgus R>L knee.      LE MMT:   MMT (out of 5) Right 10/06/2022 Left 10/06/2022  Hip flexion 4 4  Hip extension      Hip abduction 5 5  Hip adduction 5 5  Hip internal rotation      Hip external rotation      Knee flexion 5 5  Knee extension 5 5  Ankle dorsiflexion 4 5  Ankle plantarflexion      Ankle inversion      Ankle eversion      (* = pain; Blank rows = not tested)     Sensation Decreased light touch sensation in bilateral toes and dorsum of feet     Reflexes Deferred     Cranial Nerves Visual acuity and visual fields are intact  Extraocular muscles are intact; pt does have difficulty tracking vertically from top of visual field back to neutral position  Facial sensation is intact bilaterally  Facial strength is intact bilaterally  Hearing is normal as tested by gross conversation Palate elevates midline, normal phonation  Shoulder shrug strength is intact  Tongue protrudes midline     Coordination/Cerebellar Finger to Nose: WNL Heel to Shin: WNL Rapid alternating movements: WNL Finger Opposition: WNL Pronator Drift: Negative     FUNCTIONAL OUTCOME MEASURES     Results Comments  BERG 10/08/22: 48/56 Fall risk, in need of intervention  TUG 10/06/22: 19.5 seconds     5TSTS 10/06/22: 17 seconds    DGI 10/08/22: 11/24    10 Meter Gait Speed 10/08/22: Self-selected: s = 0.59 m/s;  Fastest: s = 0.69 m/s Below normative values for full community ambulation  (Blank rows = not tested)          TODAY'S TREATMENT   Neuromuscular Re-education - for improved sensory integration, static and dynamic postural control, equilibrium and non-equilibrium coordination as needed for negotiating home and community environment and stepping over obstacles   In parallel bars: Standing in semi-tandem with postural perturbations, multi-directional; 2 x 1 minute (RLE in back and LLE in back)  Standing in semi-tandem on Airex pad; 4x45 sec each position (RLE in back and LLE in back)  Consecutive hurdle stepping with three 6-inch hurdles; x8 D/B length of // bars  Adjacent to low mat:  Cone reaching, pt reaching outside of BOS to challenge static postural control; 2x10 cones for increased forward and cross-body reach    4 Step Staircase:  Stair negotiation facing forward with bilateral UE support; x5  *not today* Forward stepping over obstacles with a step-to pattern, (3) 6-inch hurdles, Airex pad, 6-inch step, and Airex pad; 2x4 D/B 1/2 length of hallway  - moderate cueing for decreasing circumduction and using more of her hip flexors.  Sit to stand with Airex under feet; additional Airex on chair to increase chair height; performed 3x10 Multi-directional stepping on blue star marker; x4 in each direction Tandem standing on floor; 2x30 sec  Toe tapping, with bilat standing; alternating R/L 2x10 on 6-inch step with Airex pad on top - for fleeting unipedal stance as needed for obstacle negotiation  Standing single hurdle step; 2x10 with each LE, over dumbbell on floor for visual  target   -no UE support Tagging sticky notes on wall with ball; multi-directional reaching requiring bending/leaning; x 4 minutes - for forward reaching and having trunk stability while doing so. Balloon tapping for dynamic postural control and external perturbation; x 3 minutes  Airex pad standing march;  2x10 alternating Step up/down 3 consecutive Airex pads; 2x D/B length of parallel bars  -verbal cueing to limit pushing posterior thigh on edge of chair Functional reaching with cones; 10 cones on adjacent table; 2 trials of forward reaching and cross-body reach to place cone down with each upper limb   -for postural control with forward leaning and reaching tasks    Therapeutic Exercise - improved strength as needed to improve performance of CKC activities/functional movements and as needed for power production to prevent fall during episode of large postural perturbation, completion of performance-based outcome measures to detect fall risk and track progress   NuStep; x 5 minutes with Level 4 resistance, seat at 8 - for LE strengthening and lower limb mobility   -subjective information gathered during this time, 2 minutes unbilled   Forward/backward stepping; 5x D/B length of bars;  - verbal cueing to limit toe-out and heel strike  Squat to butt tap on edge of chair, with Airex on chair for increased seat height; 2x10  -verbal cueing and demo to reduce weightbearing on ischial tuberosities at bottom of squat    *not today* High knees 3x D/B  length of bars tuberosities at bottom of squat  Sidestepping with Blue Tband just superior to bilat patellae; 2x4 D/B length // bars Standing heel raise/toe raise; x10 alternating Standing march with light touch in single bar, in // bars; x10 alternating Sit to stand, reviewed      PATIENT EDUCATION:  Education details: see above for patient education details Person educated: Patient Education method: Explanation, Demonstration, and Handouts Education comprehension: verbalized understanding     HOME EXERCISE PROGRAM: Access Code: [email or web address] URL: [email or web address] Date: 10/12/2022 Prepared by: Valentina Gu  Exercises - Sit to Stand Without Arm Support  - 2 x daily - 7 x weekly - 2 sets - 10 reps - Heel Toe Raises  with Unilateral Counter Support  - 2 x daily - 7 x weekly - 2 sets - 10 reps - Standing March with Counter Support  - 2 x daily - 7 x weekly - 2 sets - 10 reps      ASSESSMENT:   CLINICAL IMPRESSION: Patient demonstrates excellent ability to complete righting reactions when stepping over hurdles. Pt performed well with perturbations but continues to need improvement with the backward direction corrections. Pt performed well maintaining semi-tandem stance on the Airex pad for set period with minimal postural sway. Pt demonstrates more confidence with stair negotiation in the clinic. Pt performed with good ability to reach outside BOS for cone reaching adjacent to low mat. Pt is also limited by comorbid knee OA which can limit sit to stand performance. Patient has remaining deficits in impaired postural control, gait changes, difficulty with obstacle negotiation, hip and trunk control needed to prevent forward LOB with bending over/reaching to low-lying item. Patient will benefit from skilled PT to address above impairments and improve overall function.    REHAB POTENTIAL: Good   CLINICAL DECISION MAKING: Evolving/moderate complexity   EVALUATION COMPLEXITY: Moderate     GOALS:   SHORT TERM GOALS: Target date: 10/30/2022   Pt will be independent with HEP in order to improve strength and balance in order to decrease fall  risk and improve function at home. Baseline: 11/17/22: Pt is compliant with HEP Goal status: ACHIEVED     LONG TERM GOALS: Target date: 12/04/2022   Pt will increase FOTO to at least 57 to demonstrate significant improvement in function at home related to balance  Baseline: 10/06/22: 54.   11/17/22: 48.   Goal status: NOT MET    2.  Pt will improve BERG by at least 3 points in order to demonstrate clinically significant improvement in balance.   Baseline: 10/08/22: 48/56    11/17/22: 48/56  Goal status: NOT MET   3.  Pt will improve ABC by at least 13% in order to  demonstrate clinically significant improvement in balance confidence.      Baseline:  10/08/22: 62.5%.   11/17/22: 63.1% Goal status: NOT MET    4. Pt will decrease 5TSTS by at least 3 seconds in order to demonstrate clinically significant improvement in LE strength      Baseline: 10/06/22: 17 seconds.   11/17/22: 14.4 seconds.  Goal status: IN PROGRESS    5. Pt will improve DGI by at least 3 points in order to demonstrate clinically significant improvement in balance and decreased risk for falls.     Baseline: 10/08/22: 11/24.   11/17/22: 14/24 Goal status: ACHIEVED   6. Pt will decrease TUG to below 14 seconds/decrease in order to demonstrate decreased fall risk.  Baseline: 10/06/22: 19.5 sec.   11/17/22: 16.3 sec Goal status: IN PROGRESS         PLAN: PT FREQUENCY: 2x/week   PT DURATION: 4 weeks   PLANNED INTERVENTIONS: Therapeutic exercises, Therapeutic activity, Neuromuscular re-education, Balance training, Gait training, Patient/Family education,    PLAN FOR NEXT SESSION: Continue with task practice for stepping with sound heel strike technique, postural control training, obstacle negotiation, multi-directional stepping; strategies to improve ability to reach low to floor including squatting and functional reaching. Continue with gait stability training. Introduce more stepping down motions negotiation of stairs and curbs. Recommend continued PT 2x/week for 4 weeks.    Valentina Gu, PT, DPT [id number]  Tamera Reason, SPT 12/24/2022 4:58 PM

## 2022-12-30 ENCOUNTER — Ambulatory Visit: Payer: PPO | Admitting: Physical Therapy

## 2023-01-01 ENCOUNTER — Ambulatory Visit: Payer: PPO | Admitting: Physical Therapy

## 2023-01-01 ENCOUNTER — Encounter: Payer: Self-pay | Admitting: Physical Therapy

## 2023-01-01 DIAGNOSIS — R2689 Other abnormalities of gait and mobility: Secondary | ICD-10-CM

## 2023-01-01 DIAGNOSIS — M6281 Muscle weakness (generalized): Secondary | ICD-10-CM

## 2023-01-01 DIAGNOSIS — R262 Difficulty in walking, not elsewhere classified: Secondary | ICD-10-CM

## 2023-01-01 DIAGNOSIS — G4733 Obstructive sleep apnea (adult) (pediatric): Secondary | ICD-10-CM | POA: Diagnosis not present

## 2023-01-01 NOTE — Therapy (Signed)
OUTPATIENT PHYSICAL THERAPY TREATMENT / PROGRESS NOTE AND RE-CERTIFICATION    Patient Name: Laurie Roy MRN: [id number] DOB:January 21, 1941, 82 y.o., female Today's Date: 01/01/2023   PCP: Sallee Lange, NP REFERRING PROVIDER: Sallee Lange, NP  END OF SESSION:   PT End of Session - 01/01/23 1712     Visit Number 20    Number of Visits 30    Date for PT Re-Evaluation 02/05/23    Progress Note Due on Visit 30    PT Start Time 1601    PT Stop Time 1646    PT Time Calculation (min) 45 min    Equipment Utilized During Treatment Gait belt    Activity Tolerance Patient tolerated treatment well    Behavior During Therapy WFL for tasks assessed/performed                   Past Medical History:  Diagnosis Date   Breast cancer ([place]) [id number]   Complication of anesthesia    Heart murmur    Personal history of chemotherapy    Personal history of radiation therapy    PONV (postoperative nausea and vomiting)    Past Surgical History:  Procedure Laterality Date   APPLICATION OF WOUND VAC Left 12/10/2020   Procedure: APPLICATION OF PREVENA  WOUND VAC;  Surgeon: Dereck Leep, MD;  Location: [facility];  Service: Orthopedics;  Laterality: Left;   COLONOSCOPY     KNEE ARTHROPLASTY Left 12/10/2020   Procedure: COMPUTER ASSISTED TOTAL KNEE ARTHROPLASTY;  Surgeon: Dereck Leep, MD;  Location: [facility];  Service: Orthopedics;  Laterality: Left;   LEFT HEART CATH AND CORONARY ANGIOGRAPHY Left 04/16/2021   Procedure: LEFT HEART CATH AND CORONARY ANGIOGRAPHY;  Surgeon: Corey Skains, MD;  Location: [facility];  Service: Cardiovascular;  Laterality: Left;   MASTECTOMY Left 2002   Patient Active Problem List   Diagnosis Date Noted   Angina pectoris ([place]) 04/16/2021   IGT (impaired glucose tolerance) 12/10/2020   Neuropathy 12/10/2020   Obesity (BMI 30-39.9) 12/10/2020   Primary osteoarthritis of left knee 09/16/2020   Primary osteoarthritis of right knee  09/16/2020   Polyarthralgia 03/26/2017   Vitamin B12 deficiency 02/01/2017   Vitamin D deficiency 02/01/2017   HTN (hypertension) 02/17/2014   Hypercholesterolemia 02/17/2014    REFERRING DIAG:  G62.9 (ICD-10-CM) - Polyneuropathy, unspecified  R26.89 (ICD-10-CM) - Other abnormalities of gait and mobility    THERAPY DIAG:  Imbalance  Difficulty in walking, not elsewhere classified  Muscle weakness (generalized)  Rationale for Evaluation and Treatment Rehabilitation  PERTINENT HISTORY: Patient is an 82 year old female referred for balance and gait deficits with Hx of longstanding bilateral peripheral neuropathy, hx of L TKA (12/10/2020) and R knee pain secondary to OA. Patient reports SOB following panic attack during her L TKA rehab. Pt uses CPAP at night now for sleep apnea. She reports small heart blockage after that episode. She reports having increased difficulty walking during this workup with cardiology. Pt reports increased fluid accumulation from 230 lbs with her weight now being 179 lbs. Patient reports difficulty with walking at this time - she uses walker when going outside usually, especially on uneven ground. Pt finds herself walking without AD in home sometimes. Hx of vascular disease and peripheral neuropathy. Pt is notably challenged with fear of falling. Hx of dyspnea on exertion    Pain: Yes, R knee; doing better following cortisone injection  Numbness/Tingling: Yes, sensory change in hands and feet from neuropathy Focal Weakness:  general LE weakness Recent changes in overall health/medication: Yes, heart blockage found  Prior history of physical therapy for balance:  No Falls: Has patient fallen in last 6 months? Yes, Number of falls: 4, pt fell when riding lawn mower when ducking under limb (pt fell backward off of lawnmower). Pt fell in home 2x when bending over to grab item from floor, pt fell forward when getting out of bed.  Directional pattern for falls: Yes,  forward falling when bending over  Imaging: Yes    Radiograph L knee 12/10/20 Status post total knee replacement with prosthetic components well-seated. No fra cture or dislocation. Acute postoperative changes noted.       Prior level of function: Independent Occupational demands: Retired  Office manager: Media planner (jewelry, painting)    Red flags (bowel/bladder changes, saddle paresthesia, personal history of cancer, h/o spinal tumors, h/o compression fx, h/o abdominal aneurysm, abdominal pain, chills/fever, night sweats, nausea, vomiting, unrelenting pain): Positive for Hx of breast cancer (in remission), otherwise no other red flags    Weight Bearing Restrictions: No   Living Environment Lives with: lives alone, daughter lives close by  Lives in: House/apartment Has following equipment at home: Single point cane, Environmental consultant - 4 wheeled, and Grab bars Pt uses step-to pattern to get up 14 steps in home 3 steps to get into home (gets up those easily)  Step-in shower, no shower bench; pt has suction grab bar     Patient Goals: Able to walk better, improve balance    PRECAUTIONS: Fall history                                                                                                                                                                   SUBJECTIVE STATEMENT: Pt reports that she has been doing well since her last visit with PT. Pt states that she had a little stumble in her home the other night while going from her bathroom into her bedroom. She describes how she stumbled backwards but was able to correct herself before she had a fall. Pt reports that she is also more confident with walking down the stairs with only unilateral UE support. Pt states that she is going to have a visit with a neurologist due to recent falls where her head made contact with the fall. Pt does not have a specific date for the appointment at this time but will update PT when notified.    PAIN:  Are you  having pain? No   OBJECTIVE: (objective measures completed at initial evaluation unless otherwise dated)   Patient Surveys  FOTO: 61, predicted improvement to 97 ABC: 62.5%     Cognition Patient is oriented to person, place, and time.  Recent memory is intact.  Remote memory is  intact.  Attention span and concentration are intact.  Expressive speech is intact.  Patient's fund of knowledge is within normal limits for educational level.                            Observation Venous distension and varicose veins in bilat legs, color change. No inc tissue temperature, calf girth, pitting edema, or significant calf tenderness.     GAIT: Distance walked: 45 ft Assistive device utilized: Single point cane, RUE Level of assistance: SBA Comments: Pt walks with RLE externally rotated, wide BOS, decreased gait cadence, diminished heel strike, slow velocity    Posture: Pt rests in posterior pelvic tilt. Grossly kyphotic posture. Pes planus. Genu valgus R>L knee.      LE MMT:   MMT (out of 5) Right 10/06/2022 Left 10/06/2022  Hip flexion 4 4  Hip extension      Hip abduction 5 5  Hip adduction 5 5  Hip internal rotation      Hip external rotation      Knee flexion 5 5  Knee extension 5 5  Ankle dorsiflexion 4 5  Ankle plantarflexion      Ankle inversion      Ankle eversion      (* = pain; Blank rows = not tested)     Sensation Decreased light touch sensation in bilateral toes and dorsum of feet     Reflexes Deferred     Cranial Nerves Visual acuity and visual fields are intact  Extraocular muscles are intact; pt does have difficulty tracking vertically from top of visual field back to neutral position  Facial sensation is intact bilaterally  Facial strength is intact bilaterally  Hearing is normal as tested by gross conversation Palate elevates midline, normal phonation  Shoulder shrug strength is intact  Tongue protrudes midline      Coordination/Cerebellar Finger to Nose: WNL Heel to Shin: WNL Rapid alternating movements: WNL Finger Opposition: WNL Pronator Drift: Negative     FUNCTIONAL OUTCOME MEASURES     Results Comments  BERG 10/08/22: 48/56 Fall risk, in need of intervention  TUG 10/06/22: 19.5 seconds     5TSTS 10/06/22: 17 seconds    DGI 10/08/22: 11/24    10 Meter Gait Speed 10/08/22: Self-selected: s = 0.59 m/s; Fastest: s = 0.69 m/s Below normative values for full community ambulation  (Blank rows = not tested)          TODAY'S TREATMENT   Neuromuscular Re-education - for improved sensory integration, static and dynamic postural control, equilibrium and non-equilibrium coordination as needed for negotiating home and community environment and stepping over obstacles    - performance of TUG for balance re-assessment  - performance of 5TSTS for balance re-assessment  - performance of BERG for balance re-assessment  - performance of DGI for balance re-assessment   *not today*  In parallel bars: Standing in semi-tandem with postural perturbations, multi-directional; 2 x 1 minute (RLE in back and LLE in back) Standing in semi-tandem on Airex pad; 4x45 sec each position (RLE in back and LLE in back) Consecutive hurdle stepping with three 6-inch hurdles; x8 D/B length of // bars Adjacent to low mat:  Cone reaching, pt reaching outside of BOS to challenge static postural control; 2x10 cones for increased forward and cross-body reach  4 Step Staircase:  Stair negotiation facing forward with bilateral UE support; x5 Forward stepping over obstacles with a step-to pattern, (3) 6-inch hurdles,  Airex pad, 6-inch step, and Airex pad; 2x4 D/B 1/2 length of hallway  - moderate cueing for decreasing circumduction and using more of her hip flexors.  Sit to stand with Airex under feet; additional Airex on chair to increase chair height; performed 3x10 Multi-directional stepping on blue star marker; x4 in each  direction Tandem standing on floor; 2x30 sec  Toe tapping, with bilat standing; alternating R/L 2x10 on 6-inch step with Airex pad on top - for fleeting unipedal stance as needed for obstacle negotiation  Standing single hurdle step; 2x10 with each LE, over dumbbell on floor for visual target   -no UE support Tagging sticky notes on wall with ball; multi-directional reaching requiring bending/leaning; x 4 minutes - for forward reaching and having trunk stability while doing so. Balloon tapping for dynamic postural control and external perturbation; x 3 minutes  Airex pad standing march; 2x10 alternating Step up/down 3 consecutive Airex pads; 2x D/B length of parallel bars  -verbal cueing to limit pushing posterior thigh on edge of chair Functional reaching with cones; 10 cones on adjacent table; 2 trials of forward reaching and cross-body reach to place cone down with each upper limb   -for postural control with forward leaning and reaching tasks    Therapeutic Exercise - improved strength as needed to improve performance of CKC activities/functional movements and as needed for power production to prevent fall during episode of large postural perturbation, completion of performance-based outcome measures to detect fall risk and track progress     - Walking laps around the open gym space for LE strengthening and lower limb mobility    - subjective information gathered during this time, 2 minutes unbilled     *not today* NuStep; x 5 minutes with Level 4 resistance, seat at 8 - for LE strengthening and lower limb mobility   -subjective information gathered during this time, 2 minutes unbilled   Forward/backward stepping; 5x D/B length of bars;  - verbal cueing to limit toe-out and heel strike  Squat to butt tap on edge of chair, with Airex on chair for increased seat height; 2x10 -verbal cueing and demo to reduce weightbearing on ischial tuberosities at bottom of squat High knees 3x D/B   length of bars tuberosities at bottom of squat  Sidestepping with Blue Tband just superior to bilat patellae; 2x4 D/B length // bars Standing heel raise/toe raise; x10 alternating Standing march with light touch in single bar, in // bars; x10 alternating Sit to stand, reviewed      PATIENT EDUCATION:  Education details: see above for patient education details Person educated: Patient Education method: Explanation, Demonstration, and Handouts Education comprehension: verbalized understanding     HOME EXERCISE PROGRAM: Access Code: [email or web address] URL: [email or web address] Date: 10/12/2022 Prepared by: Valentina Gu  Exercises - Sit to Stand Without Arm Support  - 2 x daily - 7 x weekly - 2 sets - 10 reps - Heel Toe Raises with Unilateral Counter Support  - 2 x daily - 7 x weekly - 2 sets - 10 reps - Standing March with Counter Support  - 2 x daily - 7 x weekly - 2 sets - 10 reps      ASSESSMENT:   CLINICAL IMPRESSION: Patient demonstrated improvement with BERG balance test, TUG, 5TSTS, and DGI performance measures. Completion of DGI balance test and 5TSTS goals demonstrates a decrease in fall risk and improvement of strength with transfer activities. Pt has partially met BERG balance test goal and TUG. Pt has  remaining difficulty with standing on a single limb, forward reaching out of BOS, stepping over obstacles, and ascending and descending the stairs. Pt demonstrates increased confidence with sit-to-stand transfers and standing in tandem stance without use of UE support for prolonged durations. Pt has remaining growth to be achieved with overall confidence, as shown by ABC scale score and FOTO scores that demonstrate a slower progression at this time. Pt is also limited by comorbid knee OA which can limit sit to stand performance. Patient has remaining deficits in impaired postural control, gait changes, difficulty with obstacle negotiation, hip and trunk control needed  to prevent forward LOB with bending over/reaching to low-lying item. Patient will benefit from skilled PT to address above impairments and improve overall function.    REHAB POTENTIAL: Good   CLINICAL DECISION MAKING: Evolving/moderate complexity   EVALUATION COMPLEXITY: Moderate     GOALS:   SHORT TERM GOALS: Target date: 10/30/2022   Pt will be independent with HEP in order to improve strength and balance in order to decrease fall risk and improve function at home. Baseline: 11/17/22: Pt is compliant with HEP Goal status: ACHIEVED     LONG TERM GOALS: Target date: 12/04/2022   Pt will increase FOTO to at least 57 to demonstrate significant improvement in function at home related to balance  Baseline: 10/06/22: 54.   11/17/22: 48.   01/01/23: 53 Goal status: NOT MET    2.  Pt will improve BERG by at least 3 points in order to demonstrate clinically significant improvement in balance.   Baseline: 10/08/22: 48/56    11/17/22: 48/56    01/01/23: 50/56 Goal status: PARTIAL MET   3.  Pt will improve ABC by at least 13% in order to demonstrate clinically significant improvement in balance confidence.      Baseline:  10/08/22: 62.5%.   11/17/22: 63.1%    01/01/23: 65.6% Goal status: NOT MET    4. Pt will decrease 5TSTS by at least 3 seconds in order to demonstrate clinically significant improvement in LE strength      Baseline: 10/06/22: 17 sec   11/17/22: 14.4 sec     01/01/23: 12.47 sec Goal status: ACHIEVED   5. Pt will improve DGI by at least 3 points in order to demonstrate clinically significant improvement in balance and decreased risk for falls.     Baseline: 10/08/22: 11/24.   11/17/22: 14/24    01/01/23: 17/24 Goal status: ACHIEVED   6. Pt will decrease TUG to below 14 seconds/decrease in order to demonstrate decreased fall risk.  Baseline: 10/06/22: 19.5 sec.   11/17/22: 16.3 sec    01/01/23: 14.4 sec Goal status: PARTIAL MET         PLAN: PT FREQUENCY: 2x/week   PT DURATION: 4  weeks   PLANNED INTERVENTIONS: Therapeutic exercises, Therapeutic activity, Neuromuscular re-education, Balance training, Gait training, Patient/Family education,    PLAN FOR NEXT SESSION: Continue with task practice for stepping with sound heel strike technique, postural control training, obstacle negotiation, multi-directional stepping; strategies to improve ability to reach low to floor including squatting and functional reaching. Continue with gait stability training. Introduce more stepping down motions negotiation of stairs and curbs. Recommend continued PT 2x/week for 4 weeks.    Valentina Gu, PT, DPT [phone]  Tamera Reason, SPT 01/01/2023 10:59 PM

## 2023-01-05 ENCOUNTER — Ambulatory Visit: Payer: PPO | Attending: Nurse Practitioner | Admitting: Physical Therapy

## 2023-01-05 ENCOUNTER — Encounter: Payer: Self-pay | Admitting: Physical Therapy

## 2023-01-05 DIAGNOSIS — M6281 Muscle weakness (generalized): Secondary | ICD-10-CM | POA: Diagnosis not present

## 2023-01-05 DIAGNOSIS — R262 Difficulty in walking, not elsewhere classified: Secondary | ICD-10-CM | POA: Diagnosis not present

## 2023-01-05 DIAGNOSIS — R2689 Other abnormalities of gait and mobility: Secondary | ICD-10-CM | POA: Diagnosis not present

## 2023-01-05 NOTE — Therapy (Signed)
OUTPATIENT PHYSICAL THERAPY TREATMENT    Patient Name: Laurie Roy MRN: [id number] DOB:[birth year], 82 y.o., female Today's Date: 01/05/2023   PCP: Sallee Lange, NP REFERRING PROVIDER: Sallee Lange, NP  END OF SESSION:   PT End of Session - 01/05/23 1640     Visit Number 21    Number of Visits 30    Date for PT Re-Evaluation 02/05/23    Progress Note Due on Visit 30    PT Start Time 1633    PT Stop Time 1715    PT Time Calculation (min) 42 min    Equipment Utilized During Treatment Gait belt    Activity Tolerance Patient tolerated treatment well    Behavior During Therapy WFL for tasks assessed/performed              Past Medical History:  Diagnosis Date   Breast cancer ([place]) [id number]   Complication of anesthesia    Heart murmur    Personal history of chemotherapy    Personal history of radiation therapy    PONV (postoperative nausea and vomiting)    Past Surgical History:  Procedure Laterality Date   APPLICATION OF WOUND VAC Left 12/10/2020   Procedure: APPLICATION OF PREVENA  WOUND VAC;  Surgeon: Dereck Leep, MD;  Location: [facility];  Service: Orthopedics;  Laterality: Left;   COLONOSCOPY     KNEE ARTHROPLASTY Left 12/10/2020   Procedure: COMPUTER ASSISTED TOTAL KNEE ARTHROPLASTY;  Surgeon: Dereck Leep, MD;  Location: [facility];  Service: Orthopedics;  Laterality: Left;   LEFT HEART CATH AND CORONARY ANGIOGRAPHY Left 04/16/2021   Procedure: LEFT HEART CATH AND CORONARY ANGIOGRAPHY;  Surgeon: Corey Skains, MD;  Location: [facility];  Service: Cardiovascular;  Laterality: Left;   MASTECTOMY Left 2002   Patient Active Problem List   Diagnosis Date Noted   Angina pectoris ([place]) 04/16/2021   IGT (impaired glucose tolerance) 12/10/2020   Neuropathy 12/10/2020   Obesity (BMI 30-39.9) 12/10/2020   Primary osteoarthritis of left knee 09/16/2020   Primary osteoarthritis of right knee 09/16/2020   Polyarthralgia 03/26/2017   Vitamin  B12 deficiency 02/01/2017   Vitamin D deficiency 02/01/2017   HTN (hypertension) 02/17/2014   Hypercholesterolemia 02/17/2014    REFERRING DIAG:  G62.9 (ICD-10-CM) - Polyneuropathy, unspecified  R26.89 (ICD-10-CM) - Other abnormalities of gait and mobility    THERAPY DIAG:  Imbalance  Difficulty in walking, not elsewhere classified  Muscle weakness (generalized)  Rationale for Evaluation and Treatment Rehabilitation  PERTINENT HISTORY: Patient is an 82 year old female referred for balance and gait deficits with Hx of longstanding bilateral peripheral neuropathy, hx of L TKA (12/10/2020) and R knee pain secondary to OA. Patient reports SOB following panic attack during her L TKA rehab. Pt uses CPAP at night now for sleep apnea. She reports small heart blockage after that episode. She reports having increased difficulty walking during this workup with cardiology. Pt reports increased fluid accumulation from 230 lbs with her weight now being 179 lbs. Patient reports difficulty with walking at this time - she uses walker when going outside usually, especially on uneven ground. Pt finds herself walking without AD in home sometimes. Hx of vascular disease and peripheral neuropathy. Pt is notably challenged with fear of falling. Hx of dyspnea on exertion    Pain: Yes, R knee; doing better following cortisone injection  Numbness/Tingling: Yes, sensory change in hands and feet from neuropathy Focal Weakness: general LE weakness Recent changes in overall health/medication: Yes, heart  blockage found  Prior history of physical therapy for balance:  No Falls: Has patient fallen in last 6 months? Yes, Number of falls: 4, pt fell when riding lawn mower when ducking under limb (pt fell backward off of lawnmower). Pt fell in home 2x when bending over to grab item from floor, pt fell forward when getting out of bed.  Directional pattern for falls: Yes, forward falling when bending over  Imaging: Yes     Radiograph L knee 12/10/20 Status post total knee replacement with prosthetic components well-seated. No fra cture or dislocation. Acute postoperative changes noted.       Prior level of function: Independent Occupational demands: Retired  Office manager: Media planner (jewelry, painting)    Red flags (bowel/bladder changes, saddle paresthesia, personal history of cancer, h/o spinal tumors, h/o compression fx, h/o abdominal aneurysm, abdominal pain, chills/fever, night sweats, nausea, vomiting, unrelenting pain): Positive for Hx of breast cancer (in remission), otherwise no other red flags    Weight Bearing Restrictions: No   Living Environment Lives with: lives alone, daughter lives close by  Lives in: House/apartment Has following equipment at home: Single point cane, Environmental consultant - 4 wheeled, and Grab bars Pt uses step-to pattern to get up 14 steps in home 3 steps to get into home (gets up those easily)  Step-in shower, no shower bench; pt has suction grab bar     Patient Goals: Able to walk better, improve balance    PRECAUTIONS: Fall history                                                                                                                                                                   SUBJECTIVE STATEMENT: Pt reports stepping onto shoe on floor by accident when stepping backward and stumbled backward onto her bed. She reports no other recent near-falls. Patient reports no other recent changes at arrival to PT.    PAIN:  Are you having pain? No   OBJECTIVE: (objective measures completed at initial evaluation unless otherwise dated)   Patient Surveys  FOTO: 87, predicted improvement to 74 ABC: 62.5%     Cognition Patient is oriented to person, place, and time.  Recent memory is intact.  Remote memory is intact.  Attention span and concentration are intact.  Expressive speech is intact.  Patient's fund of knowledge is within normal limits for educational level.                             Observation Venous distension and varicose veins in bilat legs, color change. No inc tissue temperature, calf girth, pitting edema, or significant calf tenderness.     GAIT: Distance walked: 45 ft Assistive device utilized: Single point cane, RUE  Level of assistance: SBA Comments: Pt walks with RLE externally rotated, wide BOS, decreased gait cadence, diminished heel strike, slow velocity    Posture: Pt rests in posterior pelvic tilt. Grossly kyphotic posture. Pes planus. Genu valgus R>L knee.      LE MMT:   MMT (out of 5) Right 10/06/2022 Left 10/06/2022  Hip flexion 4 4  Hip extension      Hip abduction 5 5  Hip adduction 5 5  Hip internal rotation      Hip external rotation      Knee flexion 5 5  Knee extension 5 5  Ankle dorsiflexion 4 5  Ankle plantarflexion      Ankle inversion      Ankle eversion      (* = pain; Blank rows = not tested)     Sensation Decreased light touch sensation in bilateral toes and dorsum of feet     Reflexes Deferred     Cranial Nerves Visual acuity and visual fields are intact  Extraocular muscles are intact; pt does have difficulty tracking vertically from top of visual field back to neutral position  Facial sensation is intact bilaterally  Facial strength is intact bilaterally  Hearing is normal as tested by gross conversation Palate elevates midline, normal phonation  Shoulder shrug strength is intact  Tongue protrudes midline     Coordination/Cerebellar Finger to Nose: WNL Heel to Shin: WNL Rapid alternating movements: WNL Finger Opposition: WNL Pronator Drift: Negative     FUNCTIONAL OUTCOME MEASURES     Results Comments  BERG 10/08/22: 48/56 Fall risk, in need of intervention  TUG 10/06/22: 19.5 seconds     5TSTS 10/06/22: 17 seconds    DGI 10/08/22: 11/24    10 Meter Gait Speed 10/08/22: Self-selected: s = 0.59 m/s; Fastest: s = 0.69 m/s Below normative values for full community ambulation   (Blank rows = not tested)          TODAY'S TREATMENT   Neuromuscular Re-education - for improved sensory integration, static and dynamic postural control, equilibrium and non-equilibrium coordination as needed for negotiating home and community environment and stepping over obstacles  In parallel bars: Standing in semi-tandem with postural perturbations, multi-directional; 2 x 1 minute (RLE in back and LLE in back)  Toe tapping on cone; single cone in center of bars; 2x10 alternating R/L   Multi-directional stepping (8 directions, anterior/anterolateral/lateral/posterolateral/posterior) on blue star marker; x4 each direction    *not today* Standing in semi-tandem on Airex pad; 4x45 sec each position (RLE in back and LLE in back) Consecutive hurdle stepping with three 6-inch hurdles; x8 D/B length of // bars Adjacent to low mat:  Cone reaching, pt reaching outside of BOS to challenge static postural control; 2x10 cones for increased forward and cross-body reach  4 Step Staircase:  Stair negotiation facing forward with bilateral UE support; x5 Forward stepping over obstacles with a step-to pattern, (3) 6-inch hurdles, Airex pad, 6-inch step, and Airex pad; 2x4 D/B 1/2 length of hallway  - moderate cueing for decreasing circumduction and using more of her hip flexors.  Sit to stand with Airex under feet; additional Airex on chair to increase chair height; performed 3x10 Tandem standing on floor; 2x30 sec  Toe tapping, with bilat standing; alternating R/L 2x10 on 6-inch step with Airex pad on top - for fleeting unipedal stance as needed for obstacle negotiation  Standing single hurdle step; 2x10 with each LE, over dumbbell on floor for visual target   -  no UE support Tagging sticky notes on wall with ball; multi-directional reaching requiring bending/leaning; x 4 minutes - for forward reaching and having trunk stability while doing so. Balloon tapping for dynamic postural control and  external perturbation; x 3 minutes  Airex pad standing march; 2x10 alternating Step up/down 3 consecutive Airex pads; 2x D/B length of parallel bars  -verbal cueing to limit pushing posterior thigh on edge of chair Functional reaching with cones; 10 cones on adjacent table; 2 trials of forward reaching and cross-body reach to place cone down with each upper limb   -for postural control with forward leaning and reaching tasks    Therapeutic Exercise - improved strength as needed to improve performance of CKC activities/functional movements and as needed for power production to prevent fall during episode of large postural perturbation, completion of performance-based outcome measures to detect fall risk and track progress   NuStep; x 5 minutes with Level 4 resistance, seat at 8 - for LE strengthening and lower limb mobility   -subjective information gathered during this time, 2 minutes unbilled   High knees, 4x D/B length of // bars   Forward step up to Airex pad; 2x10 on each LE   *not today* Forward/backward stepping; 5x D/B length of bars;  - verbal cueing to limit toe-out and heel strike Squat to butt tap on edge of chair, with Airex on chair for increased seat height; 2x10 -verbal cueing and demo to reduce weightbearing on ischial tuberosities at bottom of squat tuberosities at bottom of squat  Sidestepping with Blue Tband just superior to bilat patellae; 2x4 D/B length // bars Standing heel raise/toe raise; x10 alternating Standing march with light touch in single bar, in // bars; x10 alternating Sit to stand, reviewed      PATIENT EDUCATION:  Education details: see above for patient education details Person educated: Patient Education method: Explanation, Demonstration, and Handouts Education comprehension: verbalized understanding     HOME EXERCISE PROGRAM: Access Code: [email or web address] URL: [email or web address] Date: 10/12/2022 Prepared by: Valentina Gu  Exercises - Sit to Stand Without Arm Support  - 2 x daily - 7 x weekly - 2 sets - 10 reps - Heel Toe Raises with Unilateral Counter Support  - 2 x daily - 7 x weekly - 2 sets - 10 reps - Standing March with Counter Support  - 2 x daily - 7 x weekly - 2 sets - 10 reps      ASSESSMENT:   CLINICAL IMPRESSION: Patient has ongoing notable challenges with obstacle negotiation, change in surface elevation, and recovering from posterior perturbation. Pt has performed well over last 3 weeks with forward reaching tasks. Sit to stand and ability to perform partial squat is limited by comorbid knee OA; pt is considering knee replacement and is following up with orthopedics. Pt is markedly limited with weight shift to RLE and has notably shortened stance time on RLE with toe tapping drills and step-up exercise. Patient has remaining deficits in impaired postural control, gait changes, difficulty with obstacle negotiation, hip and trunk control needed to prevent forward LOB with bending over/reaching to low-lying item. Patient will benefit from skilled PT to address above impairments and improve overall function.    REHAB POTENTIAL: Good   CLINICAL DECISION MAKING: Evolving/moderate complexity   EVALUATION COMPLEXITY: Moderate     GOALS:   SHORT TERM GOALS: Target date: 10/30/2022   Pt will be independent with HEP in order to improve strength and balance in order to decrease fall  risk and improve function at home. Baseline: 11/17/22: Pt is compliant with HEP Goal status: ACHIEVED     LONG TERM GOALS: Target date: 12/04/2022   Pt will increase FOTO to at least 57 to demonstrate significant improvement in function at home related to balance  Baseline: 10/06/22: 54.   11/17/22: 48.   01/01/23: 53 Goal status: NOT MET    2.  Pt will improve BERG by at least 3 points in order to demonstrate clinically significant improvement in balance.   Baseline: 10/08/22: 48/56    11/17/22: 48/56    01/01/23:  50/56 Goal status: PARTIAL MET   3.  Pt will improve ABC by at least 13% in order to demonstrate clinically significant improvement in balance confidence.      Baseline:  10/08/22: 62.5%.   11/17/22: 63.1%    01/01/23: 65.6% Goal status: NOT MET    4. Pt will decrease 5TSTS by at least 3 seconds in order to demonstrate clinically significant improvement in LE strength      Baseline: 10/06/22: 17 sec   11/17/22: 14.4 sec     01/01/23: 12.47 sec Goal status: ACHIEVED   5. Pt will improve DGI by at least 3 points in order to demonstrate clinically significant improvement in balance and decreased risk for falls.     Baseline: 10/08/22: 11/24.   11/17/22: 14/24    01/01/23: 17/24 Goal status: ACHIEVED   6. Pt will decrease TUG to below 14 seconds/decrease in order to demonstrate decreased fall risk.  Baseline: 10/06/22: 19.5 sec.   11/17/22: 16.3 sec    01/01/23: 14.4 sec Goal status: PARTIAL MET         PLAN: PT FREQUENCY: 2x/week   PT DURATION: 4 weeks   PLANNED INTERVENTIONS: Therapeutic exercises, Therapeutic activity, Neuromuscular re-education, Balance training, Gait training, Patient/Family education,    PLAN FOR NEXT SESSION: Continue with task practice for stepping with sound heel strike technique, postural control training, obstacle negotiation, multi-directional stepping; strategies to improve ability to reach low to floor including squatting and functional reaching. Continue with gait stability training. Introduce more stepping down motions negotiation of stairs and curbs. Recommend continued PT 2x/week for 4 weeks.    Valentina Gu, PT, DPT [phone] 01/05/2023 4:41 PM

## 2023-01-08 ENCOUNTER — Encounter: Payer: Self-pay | Admitting: Physical Therapy

## 2023-01-08 ENCOUNTER — Ambulatory Visit: Payer: PPO | Admitting: Physical Therapy

## 2023-01-08 DIAGNOSIS — M6281 Muscle weakness (generalized): Secondary | ICD-10-CM

## 2023-01-08 DIAGNOSIS — R2689 Other abnormalities of gait and mobility: Secondary | ICD-10-CM | POA: Diagnosis not present

## 2023-01-08 DIAGNOSIS — R262 Difficulty in walking, not elsewhere classified: Secondary | ICD-10-CM

## 2023-01-08 NOTE — Therapy (Signed)
OUTPATIENT PHYSICAL THERAPY TREATMENT    Patient Name: Laurie Roy MRN: [id number] DOB:1941-06-15, 82 y.o., female Today's Date: 01/08/2023   PCP: Sallee Lange, NP REFERRING PROVIDER: Sallee Lange, NP  END OF SESSION:   PT End of Session - 01/08/23 1610     Visit Number 22    Number of Visits 30    Date for PT Re-Evaluation 02/05/23    Progress Note Due on Visit 48    PT Start Time 1605    PT Stop Time 1645    PT Time Calculation (min) 40 min    Equipment Utilized During Treatment Gait belt    Activity Tolerance Patient tolerated treatment well    Behavior During Therapy WFL for tasks assessed/performed               Past Medical History:  Diagnosis Date   Breast cancer ([place]) [id number]   Complication of anesthesia    Heart murmur    Personal history of chemotherapy    Personal history of radiation therapy    PONV (postoperative nausea and vomiting)    Past Surgical History:  Procedure Laterality Date   APPLICATION OF WOUND VAC Left 12/10/2020   Procedure: APPLICATION OF PREVENA  WOUND VAC;  Surgeon: Dereck Leep, MD;  Location: [facility];  Service: Orthopedics;  Laterality: Left;   COLONOSCOPY     KNEE ARTHROPLASTY Left 12/10/2020   Procedure: COMPUTER ASSISTED TOTAL KNEE ARTHROPLASTY;  Surgeon: Dereck Leep, MD;  Location: [facility];  Service: Orthopedics;  Laterality: Left;   LEFT HEART CATH AND CORONARY ANGIOGRAPHY Left 04/16/2021   Procedure: LEFT HEART CATH AND CORONARY ANGIOGRAPHY;  Surgeon: Corey Skains, MD;  Location: [facility];  Service: Cardiovascular;  Laterality: Left;   MASTECTOMY Left 2002   Patient Active Problem List   Diagnosis Date Noted   Angina pectoris ([place]) 04/16/2021   IGT (impaired glucose tolerance) 12/10/2020   Neuropathy 12/10/2020   Obesity (BMI 30-39.9) 12/10/2020   Primary osteoarthritis of left knee 09/16/2020   Primary osteoarthritis of right knee 09/16/2020   Polyarthralgia 03/26/2017   Vitamin  B12 deficiency 02/01/2017   Vitamin D deficiency 02/01/2017   HTN (hypertension) 02/17/2014   Hypercholesterolemia 02/17/2014    REFERRING DIAG:  G62.9 (ICD-10-CM) - Polyneuropathy, unspecified  R26.89 (ICD-10-CM) - Other abnormalities of gait and mobility    THERAPY DIAG:  Imbalance  Difficulty in walking, not elsewhere classified  Muscle weakness (generalized)  Rationale for Evaluation and Treatment Rehabilitation  PERTINENT HISTORY: Patient is an 82 year old female referred for balance and gait deficits with Hx of longstanding bilateral peripheral neuropathy, hx of L TKA (12/10/2020) and R knee pain secondary to OA. Patient reports SOB following panic attack during her L TKA rehab. Pt uses CPAP at night now for sleep apnea. She reports small heart blockage after that episode. She reports having increased difficulty walking during this workup with cardiology. Pt reports increased fluid accumulation from 230 lbs with her weight now being 179 lbs. Patient reports difficulty with walking at this time - she uses walker when going outside usually, especially on uneven ground. Pt finds herself walking without AD in home sometimes. Hx of vascular disease and peripheral neuropathy. Pt is notably challenged with fear of falling. Hx of dyspnea on exertion    Pain: Yes, R knee; doing better following cortisone injection  Numbness/Tingling: Yes, sensory change in hands and feet from neuropathy Focal Weakness: general LE weakness Recent changes in overall health/medication: Yes,  heart blockage found  Prior history of physical therapy for balance:  No Falls: Has patient fallen in last 6 months? Yes, Number of falls: 4, pt fell when riding lawn mower when ducking under limb (pt fell backward off of lawnmower). Pt fell in home 2x when bending over to grab item from floor, pt fell forward when getting out of bed.  Directional pattern for falls: Yes, forward falling when bending over  Imaging: Yes     Radiograph L knee 12/10/20 Status post total knee replacement with prosthetic components well-seated. No fra cture or dislocation. Acute postoperative changes noted.       Prior level of function: Independent Occupational demands: Retired  Office manager: Media planner (jewelry, painting)    Red flags (bowel/bladder changes, saddle paresthesia, personal history of cancer, h/o spinal tumors, h/o compression fx, h/o abdominal aneurysm, abdominal pain, chills/fever, night sweats, nausea, vomiting, unrelenting pain): Positive for Hx of breast cancer (in remission), otherwise no other red flags    Weight Bearing Restrictions: No   Living Environment Lives with: lives alone, daughter lives close by  Lives in: House/apartment Has following equipment at home: Single point cane, Environmental consultant - 4 wheeled, and Grab bars Pt uses step-to pattern to get up 14 steps in home 3 steps to get into home (gets up those easily)  Step-in shower, no shower bench; pt has suction grab bar     Patient Goals: Able to walk better, improve balance    PRECAUTIONS: Fall history                                                                                                                                                                   SUBJECTIVE STATEMENT: Pt reports having notable R shoulder pain related to osteoarthritis. Pt reports mild pain in her knee at arrival to PT. She reports no recent near-falls or significant incidents.    PAIN:  Are you having pain? No   OBJECTIVE: (objective measures completed at initial evaluation unless otherwise dated)   Patient Surveys  FOTO: 64, predicted improvement to 14 ABC: 62.5%     Cognition Patient is oriented to person, place, and time.  Recent memory is intact.  Remote memory is intact.  Attention span and concentration are intact.  Expressive speech is intact.  Patient's fund of knowledge is within normal limits for educational level.                             Observation Venous distension and varicose veins in bilat legs, color change. No inc tissue temperature, calf girth, pitting edema, or significant calf tenderness.     GAIT: Distance walked: 45 ft Assistive device utilized: Single point cane, RUE Level of assistance: SBA  Comments: Pt walks with RLE externally rotated, wide BOS, decreased gait cadence, diminished heel strike, slow velocity    Posture: Pt rests in posterior pelvic tilt. Grossly kyphotic posture. Pes planus. Genu valgus R>L knee.      LE MMT:   MMT (out of 5) Right 10/06/2022 Left 10/06/2022  Hip flexion 4 4  Hip extension      Hip abduction 5 5  Hip adduction 5 5  Hip internal rotation      Hip external rotation      Knee flexion 5 5  Knee extension 5 5  Ankle dorsiflexion 4 5  Ankle plantarflexion      Ankle inversion      Ankle eversion      (* = pain; Blank rows = not tested)     Sensation Decreased light touch sensation in bilateral toes and dorsum of feet     Reflexes Deferred     Cranial Nerves Visual acuity and visual fields are intact  Extraocular muscles are intact; pt does have difficulty tracking vertically from top of visual field back to neutral position  Facial sensation is intact bilaterally  Facial strength is intact bilaterally  Hearing is normal as tested by gross conversation Palate elevates midline, normal phonation  Shoulder shrug strength is intact  Tongue protrudes midline     Coordination/Cerebellar Finger to Nose: WNL Heel to Shin: WNL Rapid alternating movements: WNL Finger Opposition: WNL Pronator Drift: Negative     FUNCTIONAL OUTCOME MEASURES     Results Comments  BERG 10/08/22: 48/56 Fall risk, in need of intervention  TUG 10/06/22: 19.5 seconds     5TSTS 10/06/22: 17 seconds    DGI 10/08/22: 11/24    10 Meter Gait Speed 10/08/22: Self-selected: s = 0.59 m/s; Fastest: s = 0.69 m/s Below normative values for full community ambulation  (Blank rows = not  tested)          TODAY'S TREATMENT   Neuromuscular Re-education - for improved sensory integration, static and dynamic postural control, equilibrium and non-equilibrium coordination as needed for negotiating home and community environment and stepping over obstacles  In parallel bars: Standing in semi-tandem with postural perturbations, multi-directional; 2 x 1 minute (RLE in back and LLE in back)  Toe tapping on 6-inch step + Airex; 2x10 alternating R/L   Multi-directional stepping (8 directions, anterior/anterolateral/lateral/posterolateral/posterior) on blue star marker; x4 each direction    Obstacle course in hallway: alternating step up and down over 2 Airex pads and 3 6-inch hurdles   2x D/B obstacle course    *not today* Toe tapping on cone Standing in semi-tandem on Airex pad; 4x45 sec each position (RLE in back and LLE in back) Consecutive hurdle stepping with three 6-inch hurdles; x8 D/B length of // bars Adjacent to low mat:  Cone reaching, pt reaching outside of BOS to challenge static postural control; 2x10 cones for increased forward and cross-body reach  4 Step Staircase:  Stair negotiation facing forward with bilateral UE support; x5 Forward stepping over obstacles with a step-to pattern, (3) 6-inch hurdles, Airex pad, 6-inch step, and Airex pad; 2x4 D/B 1/2 length of hallway  - moderate cueing for decreasing circumduction and using more of her hip flexors.  Sit to stand with Airex under feet; additional Airex on chair to increase chair height; performed 3x10 Tandem standing on floor; 2x30 sec  Toe tapping, with bilat standing; alternating R/L 2x10 on 6-inch step with Airex pad on top - for fleeting unipedal stance  as needed for obstacle negotiation  Standing single hurdle step; 2x10 with each LE, over dumbbell on floor for visual target   -no UE support Tagging sticky notes on wall with ball; multi-directional reaching requiring bending/leaning; x 4 minutes - for  forward reaching and having trunk stability while doing so. Balloon tapping for dynamic postural control and external perturbation; x 3 minutes  Airex pad standing march; 2x10 alternating Step up/down 3 consecutive Airex pads; 2x D/B length of parallel bars  -verbal cueing to limit pushing posterior thigh on edge of chair Functional reaching with cones; 10 cones on adjacent table; 2 trials of forward reaching and cross-body reach to place cone down with each upper limb   -for postural control with forward leaning and reaching tasks    Therapeutic Exercise - improved strength as needed to improve performance of CKC activities/functional movements and as needed for power production to prevent fall during episode of large postural perturbation, completion of performance-based outcome measures to detect fall risk and track progress   NuStep; x 5 minutes with Level 4 resistance, seat at 8 - for LE strengthening and lower limb mobility   -subjective information gathered during this time, 1 minute unbilled  Forward/backward stepping; 5x D/B length of bars;  - verbal cueing to limit toe-out and heel strike High knees, 4x D/B length of // bars     *not today* Forward step up to Airex pad; 2x10 on each LE Squat to butt tap on edge of chair, with Airex on chair for increased seat height; 2x10 -verbal cueing and demo to reduce weightbearing on ischial tuberosities at bottom of squat tuberosities at bottom of squat  Sidestepping with Blue Tband just superior to bilat patellae; 2x4 D/B length // bars Standing heel raise/toe raise; x10 alternating Standing march with light touch in single bar, in // bars; x10 alternating Sit to stand, reviewed      PATIENT EDUCATION:  Education details: see above for patient education details Person educated: Patient Education method: Explanation, Demonstration, and Handouts Education comprehension: verbalized understanding     HOME EXERCISE PROGRAM: Access  Code: [email or web address] URL: [email or web address] Date: 10/12/2022 Prepared by: Valentina Gu  Exercises - Sit to Stand Without Arm Support  - 2 x daily - 7 x weekly - 2 sets - 10 reps - Heel Toe Raises with Unilateral Counter Support  - 2 x daily - 7 x weekly - 2 sets - 10 reps - Standing March with Counter Support  - 2 x daily - 7 x weekly - 2 sets - 10 reps      ASSESSMENT:   CLINICAL IMPRESSION: Patient demonstrates improving ability to respond to heavier postural perturbations. She is notably challenged with obstacle negotiations and stepping up/down given notable sensory deficits in bilateral feet. She participates very well with PT and does have improving confidence with her steadiness during performance of ADLs. Patient has remaining deficits in impaired postural control, gait changes, difficulty with obstacle negotiation, hip and trunk control needed to prevent forward LOB with bending over/reaching to low-lying item. Patient will benefit from skilled PT to address above impairments and improve overall function.    REHAB POTENTIAL: Good   CLINICAL DECISION MAKING: Evolving/moderate complexity   EVALUATION COMPLEXITY: Moderate     GOALS:   SHORT TERM GOALS: Target date: 10/30/2022   Pt will be independent with HEP in order to improve strength and balance in order to decrease fall risk and improve function at home. Baseline: 11/17/22: Pt is compliant with  HEP Goal status: ACHIEVED     LONG TERM GOALS: Target date: 12/04/2022   Pt will increase FOTO to at least 57 to demonstrate significant improvement in function at home related to balance  Baseline: 10/06/22: 54.   11/17/22: 48.   01/01/23: 53 Goal status: NOT MET    2.  Pt will improve BERG by at least 3 points in order to demonstrate clinically significant improvement in balance.   Baseline: 10/08/22: 48/56    11/17/22: 48/56    01/01/23: 50/56 Goal status: PARTIAL MET   3.  Pt will improve ABC by at least 13% in  order to demonstrate clinically significant improvement in balance confidence.      Baseline:  10/08/22: 62.5%.   11/17/22: 63.1%    01/01/23: 65.6% Goal status: NOT MET    4. Pt will decrease 5TSTS by at least 3 seconds in order to demonstrate clinically significant improvement in LE strength      Baseline: 10/06/22: 17 sec   11/17/22: 14.4 sec     01/01/23: 12.47 sec Goal status: ACHIEVED   5. Pt will improve DGI by at least 3 points in order to demonstrate clinically significant improvement in balance and decreased risk for falls.     Baseline: 10/08/22: 11/24.   11/17/22: 14/24    01/01/23: 17/24 Goal status: ACHIEVED   6. Pt will decrease TUG to below 14 seconds/decrease in order to demonstrate decreased fall risk.  Baseline: 10/06/22: 19.5 sec.   11/17/22: 16.3 sec    01/01/23: 14.4 sec Goal status: PARTIAL MET         PLAN: PT FREQUENCY: 2x/week   PT DURATION: 4 weeks   PLANNED INTERVENTIONS: Therapeutic exercises, Therapeutic activity, Neuromuscular re-education, Balance training, Gait training, Patient/Family education,    PLAN FOR NEXT SESSION: Continue with task practice for stepping with sound heel strike technique, postural control training, obstacle negotiation, multi-directional stepping; strategies to improve ability to reach low to floor including squatting and functional reaching. Continue with gait stability training. Introduce more stepping down motions negotiation of stairs and curbs. Recommend continued PT 2x/week for 4 weeks.    Valentina Gu, PT, DPT [phone] 01/08/2023 4:10 PM

## 2023-01-12 ENCOUNTER — Ambulatory Visit: Payer: PPO | Admitting: Physical Therapy

## 2023-01-12 NOTE — Therapy (Deleted)
OUTPATIENT PHYSICAL THERAPY TREATMENT    Patient Name: Laurie Roy MRN: [id number] DOB:Jun 15, 1941, 82 y.o., female Today's Date: 01/12/2023   PCP: Sallee Lange, NP REFERRING PROVIDER: Sallee Lange, NP  END OF SESSION:       Past Medical History:  Diagnosis Date   Breast cancer [facility]) [id number]   Complication of anesthesia    Heart murmur    Personal history of chemotherapy    Personal history of radiation therapy    PONV (postoperative nausea and vomiting)    Past Surgical History:  Procedure Laterality Date   APPLICATION OF WOUND VAC Left 12/10/2020   Procedure: APPLICATION OF PREVENA  WOUND VAC;  Surgeon: Dereck Leep, MD;  Location: [facility] ORS;  Service: Orthopedics;  Laterality: Left;   COLONOSCOPY     KNEE ARTHROPLASTY Left 12/10/2020   Procedure: COMPUTER ASSISTED TOTAL KNEE ARTHROPLASTY;  Surgeon: Dereck Leep, MD;  Location: [facility] OR[facility];  Service: Orthopedics;  Laterality: Left;   LEFT HEART CATH AND CORONARY ANGIOGRAPHY Left 04/16/2021   Procedure: LEFT HEART CATH AND CORONARY ANGIOGRAPHY;  Surgeon: Corey Skains, MD;  Location: [place] [facility];  Service: Cardiovascular;  Laterality: Left;   MASTECTOMY Left 2002   Patient Active Problem List   Diagnosis Date Noted   Angina pectoris ([place]) 04/16/2021   IGT (impaired glucose tolerance) 12/10/2020   Neuropathy 12/10/2020   Obesity (BMI 30-39.9) 12/10/2020   Primary osteoarthritis of left knee 09/16/2020   Primary osteoarthritis of right knee 09/16/2020   Polyarthralgia 03/26/2017   Vitamin B12 deficiency 02/01/2017   Vitamin D deficiency 02/01/2017   HTN (hypertension) 02/17/2014   Hypercholesterolemia 02/17/2014    REFERRING DIAG:  G62.9 (ICD-10-CM) - Polyneuropathy, unspecified  R26.89 (ICD-10-CM) - Other abnormalities of gait and mobility    THERAPY DIAG:  Imbalance  Difficulty in walking, not elsewhere classified  Muscle weakness (generalized)  Rationale for Evaluation and  Treatment Rehabilitation  PERTINENT HISTORY: Patient is an 82 year old female referred for balance and gait deficits with Hx of longstanding bilateral peripheral neuropathy, hx of L TKA (12/10/2020) and R knee pain secondary to OA. Patient reports SOB following panic attack during her L TKA rehab. Pt uses CPAP at night now for sleep apnea. She reports small heart blockage after that episode. She reports having increased difficulty walking during this workup with cardiology. Pt reports increased fluid accumulation from 230 lbs with her weight now being 179 lbs. Patient reports difficulty with walking at this time - she uses walker when going outside usually, especially on uneven ground. Pt finds herself walking without AD in home sometimes. Hx of vascular disease and peripheral neuropathy. Pt is notably challenged with fear of falling. Hx of dyspnea on exertion    Pain: Yes, R knee; doing better following cortisone injection  Numbness/Tingling: Yes, sensory change in hands and feet from neuropathy Focal Weakness: general LE weakness Recent changes in overall health/medication: Yes, heart blockage found  Prior history of physical therapy for balance:  No Falls: Has patient fallen in last 6 months? Yes, Number of falls: 4, pt fell when riding lawn mower when ducking under limb (pt fell backward off of lawnmower). Pt fell in home 2x when bending over to grab item from floor, pt fell forward when getting out of bed.  Directional pattern for falls: Yes, forward falling when bending over  Imaging: Yes    Radiograph L knee 12/10/20 Status post total knee replacement with prosthetic components well-seated. No fra cture or dislocation. Acute  postoperative changes noted.       Prior level of function: Independent Occupational demands: Retired  Office manager: Media planner (jewelry, painting)    Red flags (bowel/bladder changes, saddle paresthesia, personal history of cancer, h/o spinal tumors, h/o compression fx, h/o  abdominal aneurysm, abdominal pain, chills/fever, night sweats, nausea, vomiting, unrelenting pain): Positive for Hx of breast cancer (in remission), otherwise no other red flags    Weight Bearing Restrictions: No   Living Environment Lives with: lives alone, daughter lives close by  Lives in: House/apartment Has following equipment at home: Single point cane, Environmental consultant - 4 wheeled, and Grab bars Pt uses step-to pattern to get up 14 steps in home 3 steps to get into home (gets up those easily)  Step-in shower, no shower bench; pt has suction grab bar     Patient Goals: Able to walk better, improve balance    PRECAUTIONS: Fall history                                                                                                                                                                   SUBJECTIVE STATEMENT: Pt reports having notable R shoulder pain related to osteoarthritis. Pt reports mild pain in her knee at arrival to PT. She reports no recent near-falls or significant incidents.    PAIN:  Are you having pain? No   OBJECTIVE: (objective measures completed at initial evaluation unless otherwise dated)   Patient Surveys  FOTO: 51, predicted improvement to 30 ABC: 62.5%     Cognition Patient is oriented to person, place, and time.  Recent memory is intact.  Remote memory is intact.  Attention span and concentration are intact.  Expressive speech is intact.  Patient's fund of knowledge is within normal limits for educational level.                            Observation Venous distension and varicose veins in bilat legs, color change. No inc tissue temperature, calf girth, pitting edema, or significant calf tenderness.     GAIT: Distance walked: 45 ft Assistive device utilized: Single point cane, RUE Level of assistance: SBA Comments: Pt walks with RLE externally rotated, wide BOS, decreased gait cadence, diminished heel strike, slow velocity    Posture: Pt  rests in posterior pelvic tilt. Grossly kyphotic posture. Pes planus. Genu valgus R>L knee.      LE MMT:   MMT (out of 5) Right 10/06/2022 Left 10/06/2022  Hip flexion 4 4  Hip extension      Hip abduction 5 5  Hip adduction 5 5  Hip internal rotation      Hip external rotation      Knee flexion 5 5  Knee extension  5 5  Ankle dorsiflexion 4 5  Ankle plantarflexion      Ankle inversion      Ankle eversion      (* = pain; Blank rows = not tested)     Sensation Decreased light touch sensation in bilateral toes and dorsum of feet     Reflexes Deferred     Cranial Nerves Visual acuity and visual fields are intact  Extraocular muscles are intact; pt does have difficulty tracking vertically from top of visual field back to neutral position  Facial sensation is intact bilaterally  Facial strength is intact bilaterally  Hearing is normal as tested by gross conversation Palate elevates midline, normal phonation  Shoulder shrug strength is intact  Tongue protrudes midline     Coordination/Cerebellar Finger to Nose: WNL Heel to Shin: WNL Rapid alternating movements: WNL Finger Opposition: WNL Pronator Drift: Negative     FUNCTIONAL OUTCOME MEASURES     Results Comments  BERG 10/08/22: 48/56 Fall risk, in need of intervention  TUG 10/06/22: 19.5 seconds     5TSTS 10/06/22: 17 seconds    DGI 10/08/22: 11/24    10 Meter Gait Speed 10/08/22: Self-selected: s = 0.59 m/s; Fastest: s = 0.69 m/s Below normative values for full community ambulation  (Blank rows = not tested)          TODAY'S TREATMENT   Neuromuscular Re-education - for improved sensory integration, static and dynamic postural control, equilibrium and non-equilibrium coordination as needed for negotiating home and community environment and stepping over obstacles  In parallel bars: Standing in semi-tandem with postural perturbations, multi-directional; 2 x 1 minute (RLE in back and LLE in back)  Toe  tapping on 6-inch step + Airex; 2x10 alternating R/L   Multi-directional stepping (8 directions, anterior/anterolateral/lateral/posterolateral/posterior) on blue star marker; x4 each direction    Obstacle course in hallway: alternating step up and down over 2 Airex pads and 3 6-inch hurdles   2x D/B obstacle course    *not today* Toe tapping on cone Standing in semi-tandem on Airex pad; 4x45 sec each position (RLE in back and LLE in back) Consecutive hurdle stepping with three 6-inch hurdles; x8 D/B length of // bars Adjacent to low mat:  Cone reaching, pt reaching outside of BOS to challenge static postural control; 2x10 cones for increased forward and cross-body reach  4 Step Staircase:  Stair negotiation facing forward with bilateral UE support; x5 Forward stepping over obstacles with a step-to pattern, (3) 6-inch hurdles, Airex pad, 6-inch step, and Airex pad; 2x4 D/B 1/2 length of hallway  - moderate cueing for decreasing circumduction and using more of her hip flexors.  Sit to stand with Airex under feet; additional Airex on chair to increase chair height; performed 3x10 Tandem standing on floor; 2x30 sec  Toe tapping, with bilat standing; alternating R/L 2x10 on 6-inch step with Airex pad on top - for fleeting unipedal stance as needed for obstacle negotiation  Standing single hurdle step; 2x10 with each LE, over dumbbell on floor for visual target   -no UE support Tagging sticky notes on wall with ball; multi-directional reaching requiring bending/leaning; x 4 minutes - for forward reaching and having trunk stability while doing so. Balloon tapping for dynamic postural control and external perturbation; x 3 minutes  Airex pad standing march; 2x10 alternating Step up/down 3 consecutive Airex pads; 2x D/B length of parallel bars  -verbal cueing to limit pushing posterior thigh on edge of chair Functional reaching with cones; 10  cones on adjacent table; 2 trials of forward reaching  and cross-body reach to place cone down with each upper limb   -for postural control with forward leaning and reaching tasks    Therapeutic Exercise - improved strength as needed to improve performance of CKC activities/functional movements and as needed for power production to prevent fall during episode of large postural perturbation, completion of performance-based outcome measures to detect fall risk and track progress   NuStep; x 5 minutes with Level 4 resistance, seat at 8 - for LE strengthening and lower limb mobility   -subjective information gathered during this time, 1 minute unbilled  Forward/backward stepping; 5x D/B length of bars;  - verbal cueing to limit toe-out and heel strike High knees, 4x D/B length of // bars     *not today* Forward step up to Airex pad; 2x10 on each LE Squat to butt tap on edge of chair, with Airex on chair for increased seat height; 2x10 -verbal cueing and demo to reduce weightbearing on ischial tuberosities at bottom of squat tuberosities at bottom of squat  Sidestepping with Blue Tband just superior to bilat patellae; 2x4 D/B length // bars Standing heel raise/toe raise; x10 alternating Standing march with light touch in single bar, in // bars; x10 alternating Sit to stand, reviewed      PATIENT EDUCATION:  Education details: see above for patient education details Person educated: Patient Education method: Explanation, Demonstration, and Handouts Education comprehension: verbalized understanding     HOME EXERCISE PROGRAM: Access Code: [email or web address] URL: [email or web address] Date: 10/12/2022 Prepared by: Valentina Gu  Exercises - Sit to Stand Without Arm Support  - 2 x daily - 7 x weekly - 2 sets - 10 reps - Heel Toe Raises with Unilateral Counter Support  - 2 x daily - 7 x weekly - 2 sets - 10 reps - Standing March with Counter Support  - 2 x daily - 7 x weekly - 2 sets - 10 reps      ASSESSMENT:   CLINICAL  IMPRESSION: Patient demonstrates improving ability to respond to heavier postural perturbations. She is notably challenged with obstacle negotiations and stepping up/down given notable sensory deficits in bilateral feet. She participates very well with PT and does have improving confidence with her steadiness during performance of ADLs. Patient has remaining deficits in impaired postural control, gait changes, difficulty with obstacle negotiation, hip and trunk control needed to prevent forward LOB with bending over/reaching to low-lying item. Patient will benefit from skilled PT to address above impairments and improve overall function.    REHAB POTENTIAL: Good   CLINICAL DECISION MAKING: Evolving/moderate complexity   EVALUATION COMPLEXITY: Moderate     GOALS:   SHORT TERM GOALS: Target date: 10/30/2022   Pt will be independent with HEP in order to improve strength and balance in order to decrease fall risk and improve function at home. Baseline: 11/17/22: Pt is compliant with HEP Goal status: ACHIEVED     LONG TERM GOALS: Target date: 12/04/2022   Pt will increase FOTO to at least 57 to demonstrate significant improvement in function at home related to balance  Baseline: 10/06/22: 54.   11/17/22: 48.   01/01/23: 53 Goal status: NOT MET    2.  Pt will improve BERG by at least 3 points in order to demonstrate clinically significant improvement in balance.   Baseline: 10/08/22: 48/56    11/17/22: 48/56    01/01/23: 50/56 Goal status: PARTIAL MET   3.  Pt will  improve ABC by at least 13% in order to demonstrate clinically significant improvement in balance confidence.      Baseline:  10/08/22: 62.5%.   11/17/22: 63.1%    01/01/23: 65.6% Goal status: NOT MET    4. Pt will decrease 5TSTS by at least 3 seconds in order to demonstrate clinically significant improvement in LE strength      Baseline: 10/06/22: 17 sec   11/17/22: 14.4 sec     01/01/23: 12.47 sec Goal status: ACHIEVED   5. Pt will  improve DGI by at least 3 points in order to demonstrate clinically significant improvement in balance and decreased risk for falls.     Baseline: 10/08/22: 11/24.   11/17/22: 14/24    01/01/23: 17/24 Goal status: ACHIEVED   6. Pt will decrease TUG to below 14 seconds/decrease in order to demonstrate decreased fall risk.  Baseline: 10/06/22: 19.5 sec.   11/17/22: 16.3 sec    01/01/23: 14.4 sec Goal status: PARTIAL MET         PLAN: PT FREQUENCY: 2x/week   PT DURATION: 4 weeks   PLANNED INTERVENTIONS: Therapeutic exercises, Therapeutic activity, Neuromuscular re-education, Balance training, Gait training, Patient/Family education,    PLAN FOR NEXT SESSION: Continue with task practice for stepping with sound heel strike technique, postural control training, obstacle negotiation, multi-directional stepping; strategies to improve ability to reach low to floor including squatting and functional reaching. Continue with gait stability training. Introduce more stepping down motions negotiation of stairs and curbs. Recommend continued PT 2x/week for 4 weeks.    Valentina Gu, PT, DPT [phone] 01/12/2023 12:42 PM

## 2023-01-15 ENCOUNTER — Encounter: Payer: Self-pay | Admitting: Physical Therapy

## 2023-01-15 ENCOUNTER — Ambulatory Visit: Payer: PPO | Admitting: Physical Therapy

## 2023-01-15 DIAGNOSIS — R2689 Other abnormalities of gait and mobility: Secondary | ICD-10-CM

## 2023-01-15 DIAGNOSIS — M6281 Muscle weakness (generalized): Secondary | ICD-10-CM

## 2023-01-15 DIAGNOSIS — R262 Difficulty in walking, not elsewhere classified: Secondary | ICD-10-CM

## 2023-01-15 NOTE — Therapy (Signed)
OUTPATIENT PHYSICAL THERAPY TREATMENT    Patient Name: Laurie Roy MRN: [id number] DOB:[birth year], 82 y.o., female Today's Date: 01/20/2023   PCP: Sallee Lange, NP REFERRING PROVIDER: Sallee Lange, NP  END OF SESSION:   PT End of Session - 01/20/23 0558     Visit Number 23    Number of Visits 30    Date for PT Re-Evaluation 02/05/23    Progress Note Due on Visit 60    PT Start Time 1606    PT Stop Time 1646    PT Time Calculation (min) 40 min    Equipment Utilized During Treatment Gait belt    Activity Tolerance Patient tolerated treatment well    Behavior During Therapy WFL for tasks assessed/performed                Past Medical History:  Diagnosis Date   Breast cancer ([place]) [id number]   Complication of anesthesia    Heart murmur    Personal history of chemotherapy    Personal history of radiation therapy    PONV (postoperative nausea and vomiting)    Past Surgical History:  Procedure Laterality Date   APPLICATION OF WOUND VAC Left 12/10/2020   Procedure: APPLICATION OF PREVENA  WOUND VAC;  Surgeon: Dereck Leep, MD;  Location: [facility];  Service: Orthopedics;  Laterality: Left;   COLONOSCOPY     KNEE ARTHROPLASTY Left 12/10/2020   Procedure: COMPUTER ASSISTED TOTAL KNEE ARTHROPLASTY;  Surgeon: Dereck Leep, MD;  Location: [facility];  Service: Orthopedics;  Laterality: Left;   LEFT HEART CATH AND CORONARY ANGIOGRAPHY Left 04/16/2021   Procedure: LEFT HEART CATH AND CORONARY ANGIOGRAPHY;  Surgeon: Corey Skains, MD;  Location: [facility];  Service: Cardiovascular;  Laterality: Left;   MASTECTOMY Left 2002   Patient Active Problem List   Diagnosis Date Noted   Angina pectoris ([place]) 04/16/2021   IGT (impaired glucose tolerance) 12/10/2020   Neuropathy 12/10/2020   Obesity (BMI 30-39.9) 12/10/2020   Primary osteoarthritis of left knee 09/16/2020   Primary osteoarthritis of right knee 09/16/2020   Polyarthralgia 03/26/2017    Vitamin B12 deficiency 02/01/2017   Vitamin D deficiency 02/01/2017   HTN (hypertension) 02/17/2014   Hypercholesterolemia 02/17/2014    REFERRING DIAG:  G62.9 (ICD-10-CM) - Polyneuropathy, unspecified  R26.89 (ICD-10-CM) - Other abnormalities of gait and mobility    THERAPY DIAG:  Imbalance  Difficulty in walking, not elsewhere classified  Muscle weakness (generalized)  Rationale for Evaluation and Treatment Rehabilitation  PERTINENT HISTORY: Patient is an 82 year old female referred for balance and gait deficits with Hx of longstanding bilateral peripheral neuropathy, hx of L TKA (12/10/2020) and R knee pain secondary to OA. Patient reports SOB following panic attack during her L TKA rehab. Pt uses CPAP at night now for sleep apnea. She reports small heart blockage after that episode. She reports having increased difficulty walking during this workup with cardiology. Pt reports increased fluid accumulation from 230 lbs with her weight now being 179 lbs. Patient reports difficulty with walking at this time - she uses walker when going outside usually, especially on uneven ground. Pt finds herself walking without AD in home sometimes. Hx of vascular disease and peripheral neuropathy. Pt is notably challenged with fear of falling. Hx of dyspnea on exertion    Pain: Yes, R knee; doing better following cortisone injection  Numbness/Tingling: Yes, sensory change in hands and feet from neuropathy Focal Weakness: general LE weakness Recent changes in overall health/medication:  Yes, heart blockage found  Prior history of physical therapy for balance:  No Falls: Has patient fallen in last 6 months? Yes, Number of falls: 4, pt fell when riding lawn mower when ducking under limb (pt fell backward off of lawnmower). Pt fell in home 2x when bending over to grab item from floor, pt fell forward when getting out of bed.  Directional pattern for falls: Yes, forward falling when bending over  Imaging: Yes     Radiograph L knee 12/10/20 Status post total knee replacement with prosthetic components well-seated. No fra cture or dislocation. Acute postoperative changes noted.       Prior level of function: Independent Occupational demands: Retired  Office manager: Media planner (jewelry, painting)    Red flags (bowel/bladder changes, saddle paresthesia, personal history of cancer, h/o spinal tumors, h/o compression fx, h/o abdominal aneurysm, abdominal pain, chills/fever, night sweats, nausea, vomiting, unrelenting pain): Positive for Hx of breast cancer (in remission), otherwise no other red flags    Weight Bearing Restrictions: No   Living Environment Lives with: lives alone, daughter lives close by  Lives in: House/apartment Has following equipment at home: Single point cane, Environmental consultant - 4 wheeled, and Grab bars Pt uses step-to pattern to get up 14 steps in home 3 steps to get into home (gets up those easily)  Step-in shower, no shower bench; pt has suction grab bar     Patient Goals: Able to walk better, improve balance    PRECAUTIONS: Fall history                                                                                                                                                                   SUBJECTIVE STATEMENT: Pt reports no significant updates today. She reports no recent near-falls or major incidents.    PAIN:  Are you having pain? No   OBJECTIVE: (objective measures completed at initial evaluation unless otherwise dated)   Patient Surveys  FOTO: 24, predicted improvement to 14 ABC: 62.5%     Cognition Patient is oriented to person, place, and time.  Recent memory is intact.  Remote memory is intact.  Attention span and concentration are intact.  Expressive speech is intact.  Patient's fund of knowledge is within normal limits for educational level.                            Observation Venous distension and varicose veins in bilat legs, color change. No  inc tissue temperature, calf girth, pitting edema, or significant calf tenderness.     GAIT: Distance walked: 45 ft Assistive device utilized: Single point cane, RUE Level of assistance: SBA Comments: Pt walks with RLE externally rotated, wide BOS, decreased gait cadence, diminished heel  strike, slow velocity    Posture: Pt rests in posterior pelvic tilt. Grossly kyphotic posture. Pes planus. Genu valgus R>L knee.      LE MMT:   MMT (out of 5) Right 10/06/2022 Left 10/06/2022  Hip flexion 4 4  Hip extension      Hip abduction 5 5  Hip adduction 5 5  Hip internal rotation      Hip external rotation      Knee flexion 5 5  Knee extension 5 5  Ankle dorsiflexion 4 5  Ankle plantarflexion      Ankle inversion      Ankle eversion      (* = pain; Blank rows = not tested)     Sensation Decreased light touch sensation in bilateral toes and dorsum of feet     Reflexes Deferred     Cranial Nerves Visual acuity and visual fields are intact  Extraocular muscles are intact; pt does have difficulty tracking vertically from top of visual field back to neutral position  Facial sensation is intact bilaterally  Facial strength is intact bilaterally  Hearing is normal as tested by gross conversation Palate elevates midline, normal phonation  Shoulder shrug strength is intact  Tongue protrudes midline     Coordination/Cerebellar Finger to Nose: WNL Heel to Shin: WNL Rapid alternating movements: WNL Finger Opposition: WNL Pronator Drift: Negative     FUNCTIONAL OUTCOME MEASURES     Results Comments  BERG 10/08/22: 48/56 Fall risk, in need of intervention  TUG 10/06/22: 19.5 seconds     5TSTS 10/06/22: 17 seconds    DGI 10/08/22: 11/24    10 Meter Gait Speed 10/08/22: Self-selected: s = 0.59 m/s; Fastest: s = 0.69 m/s Below normative values for full community ambulation  (Blank rows = not tested)          TODAY'S TREATMENT   Neuromuscular Re-education - for improved  sensory integration, static and dynamic postural control, equilibrium and non-equilibrium coordination as needed for negotiating home and community environment and stepping over obstacles  In parallel bars: Standing in semi-tandem with postural perturbations, multi-directional; 2 x 1 minute (RLE in back and LLE in back)  Toe tapping on 6-inch step + Airex; 2x10 alternating R/L   Multi-directional stepping (8 directions, anterior/anterolateral/lateral/posterolateral/posterior) on blue star marker; x4 each direction    Obstacle course on lawn outside of clinic:  Step-to pattern, stepping over 4 consecutive boards on ground; 5x D/B   *not today* Toe tapping on cone Standing in semi-tandem on Airex pad; 4x45 sec each position (RLE in back and LLE in back) Consecutive hurdle stepping with three 6-inch hurdles; x8 D/B length of // bars Adjacent to low mat:  Cone reaching, pt reaching outside of BOS to challenge static postural control; 2x10 cones for increased forward and cross-body reach  4 Step Staircase:  Stair negotiation facing forward with bilateral UE support; x5 Forward stepping over obstacles with a step-to pattern, (3) 6-inch hurdles, Airex pad, 6-inch step, and Airex pad; 2x4 D/B 1/2 length of hallway  - moderate cueing for decreasing circumduction and using more of her hip flexors.  Sit to stand with Airex under feet; additional Airex on chair to increase chair height; performed 3x10 Tandem standing on floor; 2x30 sec  Toe tapping, with bilat standing; alternating R/L 2x10 on 6-inch step with Airex pad on top - for fleeting unipedal stance as needed for obstacle negotiation  Standing single hurdle step; 2x10 with each LE, over dumbbell on floor for  visual target   -no UE support Tagging sticky notes on wall with ball; multi-directional reaching requiring bending/leaning; x 4 minutes - for forward reaching and having trunk stability while doing so. Balloon tapping for dynamic  postural control and external perturbation; x 3 minutes  Airex pad standing march; 2x10 alternating Step up/down 3 consecutive Airex pads; 2x D/B length of parallel bars  -verbal cueing to limit pushing posterior thigh on edge of chair Functional reaching with cones; 10 cones on adjacent table; 2 trials of forward reaching and cross-body reach to place cone down with each upper limb   -for postural control with forward leaning and reaching tasks    Therapeutic Exercise - improved strength as needed to improve performance of CKC activities/functional movements and as needed for power production to prevent fall during episode of large postural perturbation, completion of performance-based outcome measures to detect fall risk and track progress   NuStep; x 5 minutes with Level 4 resistance, seat at 8 - for LE strengthening and lower limb mobility   -subjective information gathered during this time, 1 minute unbilled  Forward/backward stepping; 5x D/B length of bars;  - verbal cueing to limit toe-out and heel strike High knees, 4x D/B length of // bars    *next visit* Forward step up to Airex pad; 2x10 on each LE   *not today* Squat to butt tap on edge of chair, with Airex on chair for increased seat height; 2x10 -verbal cueing and demo to reduce weightbearing on ischial tuberosities at bottom of squat Sidestepping with Blue Tband just superior to bilat patellae; 2x4 D/B length // bars Standing heel raise/toe raise; x10 alternating Standing march with light touch in single bar, in // bars; x10 alternating Sit to stand, reviewed      PATIENT EDUCATION:  Education details: see above for patient education details Person educated: Patient Education method: Explanation, Demonstration, and Handouts Education comprehension: verbalized understanding     HOME EXERCISE PROGRAM: Access Code: [email or web address] URL: [email or web address] Date: 10/12/2022 Prepared by: Valentina Gu  Exercises - Sit to Stand Without Arm Support  - 2 x daily - 7 x weekly - 2 sets - 10 reps - Heel Toe Raises with Unilateral Counter Support  - 2 x daily - 7 x weekly - 2 sets - 10 reps - Standing March with Counter Support  - 2 x daily - 7 x weekly - 2 sets - 10 reps      ASSESSMENT:   CLINICAL IMPRESSION: Patient has ongoing challenges with stepping up/down and she has difficulty with tasks requiring brief unipedal stance due to sensory change in her feet with neuropathy. She would benefit from additional work on simulated tasks for reaching low to ground. She demonstrates good response to postural perturbations with use of narrow BOS to challenge postural control more. Patient has remaining deficits in impaired postural control, gait changes, difficulty with obstacle negotiation, hip and trunk control needed to prevent forward LOB with bending over/reaching to low-lying item. Patient will benefit from skilled PT to address above impairments and improve overall function.    REHAB POTENTIAL: Good   CLINICAL DECISION MAKING: Evolving/moderate complexity   EVALUATION COMPLEXITY: Moderate     GOALS:   SHORT TERM GOALS: Target date: 10/30/2022   Pt will be independent with HEP in order to improve strength and balance in order to decrease fall risk and improve function at home. Baseline: 11/17/22: Pt is compliant with HEP Goal status: ACHIEVED     LONG TERM  GOALS: Target date: 12/04/2022   Pt will increase FOTO to at least 57 to demonstrate significant improvement in function at home related to balance  Baseline: 10/06/22: 54.   11/17/22: 48.   01/01/23: 53 Goal status: NOT MET    2.  Pt will improve BERG by at least 3 points in order to demonstrate clinically significant improvement in balance.   Baseline: 10/08/22: 48/56    11/17/22: 48/56    01/01/23: 50/56 Goal status: PARTIAL MET   3.  Pt will improve ABC by at least 13% in order to demonstrate clinically significant  improvement in balance confidence.      Baseline:  10/08/22: 62.5%.   11/17/22: 63.1%    01/01/23: 65.6% Goal status: NOT MET    4. Pt will decrease 5TSTS by at least 3 seconds in order to demonstrate clinically significant improvement in LE strength      Baseline: 10/06/22: 17 sec   11/17/22: 14.4 sec     01/01/23: 12.47 sec Goal status: ACHIEVED   5. Pt will improve DGI by at least 3 points in order to demonstrate clinically significant improvement in balance and decreased risk for falls.     Baseline: 10/08/22: 11/24.   11/17/22: 14/24    01/01/23: 17/24 Goal status: ACHIEVED   6. Pt will decrease TUG to below 14 seconds/decrease in order to demonstrate decreased fall risk.  Baseline: 10/06/22: 19.5 sec.   11/17/22: 16.3 sec    01/01/23: 14.4 sec Goal status: PARTIAL MET         PLAN: PT FREQUENCY: 2x/week   PT DURATION: 4 weeks   PLANNED INTERVENTIONS: Therapeutic exercises, Therapeutic activity, Neuromuscular re-education, Balance training, Gait training, Patient/Family education,    PLAN FOR NEXT SESSION: Continue with task practice for stepping with sound heel strike technique, postural control training, obstacle negotiation, multi-directional stepping; strategies to improve ability to reach low to floor including squatting and functional reaching. Continue with gait stability training. Introduce more stepping down motions negotiation of stairs and curbs. Recommend continued PT 2x/week for 4 weeks.    Valentina Gu, PT, DPT [phone] 01/20/2023 5:58 AM

## 2023-01-19 ENCOUNTER — Other Ambulatory Visit: Payer: Self-pay | Admitting: Nurse Practitioner

## 2023-01-19 DIAGNOSIS — Z1231 Encounter for screening mammogram for malignant neoplasm of breast: Secondary | ICD-10-CM

## 2023-01-20 ENCOUNTER — Ambulatory Visit: Payer: PPO | Admitting: Physical Therapy

## 2023-01-20 DIAGNOSIS — M6281 Muscle weakness (generalized): Secondary | ICD-10-CM

## 2023-01-20 DIAGNOSIS — R2689 Other abnormalities of gait and mobility: Secondary | ICD-10-CM

## 2023-01-20 DIAGNOSIS — R262 Difficulty in walking, not elsewhere classified: Secondary | ICD-10-CM

## 2023-01-20 NOTE — Therapy (Signed)
OUTPATIENT PHYSICAL THERAPY TREATMENT    Patient Name: Laurie Roy MRN: [id number] DOB:Jun 24, 1941, 82 y.o., female Today's Date: 01/20/2023   PCP: Sallee Lange, NP REFERRING PROVIDER: Sallee Lange, NP  END OF SESSION:   PT End of Session - 01/26/23 0434     Visit Number 24    Number of Visits 30    Date for PT Re-Evaluation 02/05/23    Progress Note Due on Visit 3    PT Start Time 1518    PT Stop Time 1559    PT Time Calculation (min) 41 min    Equipment Utilized During Treatment Gait belt    Activity Tolerance Patient tolerated treatment well    Behavior During Therapy WFL for tasks assessed/performed             Past Medical History:  Diagnosis Date   Breast cancer ([place]) [id number]   Complication of anesthesia    Heart murmur    Personal history of chemotherapy    Personal history of radiation therapy    PONV (postoperative nausea and vomiting)    Past Surgical History:  Procedure Laterality Date   APPLICATION OF WOUND VAC Left 12/10/2020   Procedure: APPLICATION OF PREVENA  WOUND VAC;  Surgeon: Dereck Leep, MD;  Location: [facility];  Service: Orthopedics;  Laterality: Left;   COLONOSCOPY     KNEE ARTHROPLASTY Left 12/10/2020   Procedure: COMPUTER ASSISTED TOTAL KNEE ARTHROPLASTY;  Surgeon: Dereck Leep, MD;  Location: [facility];  Service: Orthopedics;  Laterality: Left;   LEFT HEART CATH AND CORONARY ANGIOGRAPHY Left 04/16/2021   Procedure: LEFT HEART CATH AND CORONARY ANGIOGRAPHY;  Surgeon: Corey Skains, MD;  Location: [facility];  Service: Cardiovascular;  Laterality: Left;   MASTECTOMY Left 2002   Patient Active Problem List   Diagnosis Date Noted   Angina pectoris ([place]) 04/16/2021   IGT (impaired glucose tolerance) 12/10/2020   Neuropathy 12/10/2020   Obesity (BMI 30-39.9) 12/10/2020   Primary osteoarthritis of left knee 09/16/2020   Primary osteoarthritis of right knee 09/16/2020   Polyarthralgia 03/26/2017   Vitamin B12  deficiency 02/01/2017   Vitamin D deficiency 02/01/2017   HTN (hypertension) 02/17/2014   Hypercholesterolemia 02/17/2014    REFERRING DIAG:  G62.9 (ICD-10-CM) - Polyneuropathy, unspecified  R26.89 (ICD-10-CM) - Other abnormalities of gait and mobility    THERAPY DIAG:  Imbalance  Difficulty in walking, not elsewhere classified  Muscle weakness (generalized)  Rationale for Evaluation and Treatment Rehabilitation  PERTINENT HISTORY: Patient is an 82 year old female referred for balance and gait deficits with Hx of longstanding bilateral peripheral neuropathy, hx of L TKA (12/10/2020) and R knee pain secondary to OA. Patient reports SOB following panic attack during her L TKA rehab. Pt uses CPAP at night now for sleep apnea. She reports small heart blockage after that episode. She reports having increased difficulty walking during this workup with cardiology. Pt reports increased fluid accumulation from 230 lbs with her weight now being 179 lbs. Patient reports difficulty with walking at this time - she uses walker when going outside usually, especially on uneven ground. Pt finds herself walking without AD in home sometimes. Hx of vascular disease and peripheral neuropathy. Pt is notably challenged with fear of falling. Hx of dyspnea on exertion    Pain: Yes, R knee; doing better following cortisone injection  Numbness/Tingling: Yes, sensory change in hands and feet from neuropathy Focal Weakness: general LE weakness Recent changes in overall health/medication: Yes, heart blockage  found  Prior history of physical therapy for balance:  No Falls: Has patient fallen in last 6 months? Yes, Number of falls: 4, pt fell when riding lawn mower when ducking under limb (pt fell backward off of lawnmower). Pt fell in home 2x when bending over to grab item from floor, pt fell forward when getting out of bed.  Directional pattern for falls: Yes, forward falling when bending over  Imaging: Yes     Radiograph L knee 12/10/20 Status post total knee replacement with prosthetic components well-seated. No fra cture or dislocation. Acute postoperative changes noted.       Prior level of function: Independent Occupational demands: Retired  Office manager: Media planner (jewelry, painting)    Red flags (bowel/bladder changes, saddle paresthesia, personal history of cancer, h/o spinal tumors, h/o compression fx, h/o abdominal aneurysm, abdominal pain, chills/fever, night sweats, nausea, vomiting, unrelenting pain): Positive for Hx of breast cancer (in remission), otherwise no other red flags    Weight Bearing Restrictions: No   Living Environment Lives with: lives alone, daughter lives close by  Lives in: House/apartment Has following equipment at home: Single point cane, Environmental consultant - 4 wheeled, and Grab bars Pt uses step-to pattern to get up 14 steps in home 3 steps to get into home (gets up those easily)  Step-in shower, no shower bench; pt has suction grab bar     Patient Goals: Able to walk better, improve balance    PRECAUTIONS: Fall history                                                                                                                                                                   SUBJECTIVE STATEMENT: Pt reports no near-falls or significant safety incidents recently. She reports being able to readily re-gain her balance during any mild LOB lately.    PAIN:  Are you having pain? No   OBJECTIVE: (objective measures completed at initial evaluation unless otherwise dated)   Patient Surveys  FOTO: 64, predicted improvement to 61 ABC: 62.5%     Cognition Patient is oriented to person, place, and time.  Recent memory is intact.  Remote memory is intact.  Attention span and concentration are intact.  Expressive speech is intact.  Patient's fund of knowledge is within normal limits for educational level.                            Observation Venous distension  and varicose veins in bilat legs, color change. No inc tissue temperature, calf girth, pitting edema, or significant calf tenderness.     GAIT: Distance walked: 45 ft Assistive device utilized: Single point cane, RUE Level of assistance: SBA Comments: Pt walks with RLE externally rotated, wide  BOS, decreased gait cadence, diminished heel strike, slow velocity    Posture: Pt rests in posterior pelvic tilt. Grossly kyphotic posture. Pes planus. Genu valgus R>L knee.      LE MMT:   MMT (out of 5) Right 10/06/2022 Left 10/06/2022  Hip flexion 4 4  Hip extension      Hip abduction 5 5  Hip adduction 5 5  Hip internal rotation      Hip external rotation      Knee flexion 5 5  Knee extension 5 5  Ankle dorsiflexion 4 5  Ankle plantarflexion      Ankle inversion      Ankle eversion      (* = pain; Blank rows = not tested)     Sensation Decreased light touch sensation in bilateral toes and dorsum of feet     Reflexes Deferred     Cranial Nerves Visual acuity and visual fields are intact  Extraocular muscles are intact; pt does have difficulty tracking vertically from top of visual field back to neutral position  Facial sensation is intact bilaterally  Facial strength is intact bilaterally  Hearing is normal as tested by gross conversation Palate elevates midline, normal phonation  Shoulder shrug strength is intact  Tongue protrudes midline     Coordination/Cerebellar Finger to Nose: WNL Heel to Shin: WNL Rapid alternating movements: WNL Finger Opposition: WNL Pronator Drift: Negative     FUNCTIONAL OUTCOME MEASURES     Results Comments  BERG 10/08/22: 48/56 Fall risk, in need of intervention  TUG 10/06/22: 19.5 seconds     5TSTS 10/06/22: 17 seconds    DGI 10/08/22: 11/24    10 Meter Gait Speed 10/08/22: Self-selected: s = 0.59 m/s; Fastest: s = 0.69 m/s Below normative values for full community ambulation  (Blank rows = not tested)          TODAY'S  TREATMENT   Neuromuscular Re-education - for improved sensory integration, static and dynamic postural control, equilibrium and non-equilibrium coordination as needed for negotiating home and community environment and stepping over obstacles  In parallel bars: Standing in semi-tandem with postural perturbations, multi-directional; 2 x 1 minute (RLE in back and LLE in back)  Toe tapping on 6-inch step + Airex; 2x10 alternating R/L   Multi-directional stepping (8 directions, anterior/anterolateral/lateral/posterolateral/posterior) on blue star marker; x4 each direction    *not today* Toe tapping on cone Standing in semi-tandem on Airex pad; 4x45 sec each position (RLE in back and LLE in back) Consecutive hurdle stepping with three 6-inch hurdles; x8 D/B length of // bars Adjacent to low mat:  Cone reaching, pt reaching outside of BOS to challenge static postural control; 2x10 cones for increased forward and cross-body reach  4 Step Staircase:  Stair negotiation facing forward with bilateral UE support; x5 Forward stepping over obstacles with a step-to pattern, (3) 6-inch hurdles, Airex pad, 6-inch step, and Airex pad; 2x4 D/B 1/2 length of hallway  - moderate cueing for decreasing circumduction and using more of her hip flexors.  Sit to stand with Airex under feet; additional Airex on chair to increase chair height; performed 3x10 Tandem standing on floor; 2x30 sec  Toe tapping, with bilat standing; alternating R/L 2x10 on 6-inch step with Airex pad on top - for fleeting unipedal stance as needed for obstacle negotiation  Standing single hurdle step; 2x10 with each LE, over dumbbell on floor for visual target   -no UE support Tagging sticky notes on wall with ball; multi-directional  reaching requiring bending/leaning; x 4 minutes - for forward reaching and having trunk stability while doing so. Balloon tapping for dynamic postural control and external perturbation; x 3 minutes  Airex pad  standing march; 2x10 alternating Step up/down 3 consecutive Airex pads; 2x D/B length of parallel bars  -verbal cueing to limit pushing posterior thigh on edge of chair Functional reaching with cones; 10 cones on adjacent table; 2 trials of forward reaching and cross-body reach to place cone down with each upper limb   -for postural control with forward leaning and reaching tasks    Therapeutic Exercise - improved strength as needed to improve performance of CKC activities/functional movements and as needed for power production to prevent fall during episode of large postural perturbation, completion of performance-based outcome measures to detect fall risk and track progress   NuStep; x 5 minutes with Level 4 resistance, seat at 8 - for LE strengthening and lower limb mobility   -subjective information gathered during this time, 1 minute unbilled  Forward/backward stepping; 5x D/B blue agility ladder ;  - verbal cueing to limit toe-out and heel strike High knees, 4x D/B length of // bars   Forward step up to Airex pad; 2x10 on each LE   *not today* Squat to butt tap on edge of chair, with Airex on chair for increased seat height; 2x10 -verbal cueing and demo to reduce weightbearing on ischial tuberosities at bottom of squat Sidestepping with Blue Tband just superior to bilat patellae; 2x4 D/B length // bars Standing heel raise/toe raise; x10 alternating Standing march with light touch in single bar, in // bars; x10 alternating Sit to stand, reviewed      PATIENT EDUCATION:  Education details: see above for patient education details Person educated: Patient Education method: Explanation, Demonstration, and Handouts Education comprehension: verbalized understanding     HOME EXERCISE PROGRAM: Access Code: [email or web address] URL: [email or web address] Date: 10/12/2022 Prepared by: Valentina Gu  Exercises - Sit to Stand Without Arm Support  - 2 x daily - 7 x weekly - 2  sets - 10 reps - Heel Toe Raises with Unilateral Counter Support  - 2 x daily - 7 x weekly - 2 sets - 10 reps - Standing March with Counter Support  - 2 x daily - 7 x weekly - 2 sets - 10 reps      ASSESSMENT:   CLINICAL IMPRESSION: Patient is improving with ability to clear change in surface elevation/small step up and demonstrates significantly improved response to postural perturbations. She is most challenged with moderate-to-heavy posterior perturbations today. Comorbid R knee OA does limit patient's ability to complete unipedal stance on RLE. Pt is making notable progress, but she needs further work on dynamic balance and reactive balance strategies. Patient has remaining deficits in impaired postural control, gait changes, difficulty with obstacle negotiation, hip and trunk control needed to prevent forward LOB with bending over/reaching to low-lying item. Patient will benefit from skilled PT to address above impairments and improve overall function.    REHAB POTENTIAL: Good   CLINICAL DECISION MAKING: Evolving/moderate complexity   EVALUATION COMPLEXITY: Moderate     GOALS:   SHORT TERM GOALS: Target date: 10/30/2022   Pt will be independent with HEP in order to improve strength and balance in order to decrease fall risk and improve function at home. Baseline: 11/17/22: Pt is compliant with HEP Goal status: ACHIEVED     LONG TERM GOALS: Target date: 12/04/2022   Pt will increase FOTO to at least  57 to demonstrate significant improvement in function at home related to balance  Baseline: 10/06/22: 54.   11/17/22: 48.   01/01/23: 53 Goal status: NOT MET    2.  Pt will improve BERG by at least 3 points in order to demonstrate clinically significant improvement in balance.   Baseline: 10/08/22: 48/56    11/17/22: 48/56    01/01/23: 50/56 Goal status: PARTIAL MET   3.  Pt will improve ABC by at least 13% in order to demonstrate clinically significant improvement in balance confidence.       Baseline:  10/08/22: 62.5%.   11/17/22: 63.1%    01/01/23: 65.6% Goal status: NOT MET    4. Pt will decrease 5TSTS by at least 3 seconds in order to demonstrate clinically significant improvement in LE strength      Baseline: 10/06/22: 17 sec   11/17/22: 14.4 sec     01/01/23: 12.47 sec Goal status: ACHIEVED   5. Pt will improve DGI by at least 3 points in order to demonstrate clinically significant improvement in balance and decreased risk for falls.     Baseline: 10/08/22: 11/24.   11/17/22: 14/24    01/01/23: 17/24 Goal status: ACHIEVED   6. Pt will decrease TUG to below 14 seconds/decrease in order to demonstrate decreased fall risk.  Baseline: 10/06/22: 19.5 sec.   11/17/22: 16.3 sec    01/01/23: 14.4 sec Goal status: PARTIAL MET         PLAN: PT FREQUENCY: 2x/week   PT DURATION: 4 weeks   PLANNED INTERVENTIONS: Therapeutic exercises, Therapeutic activity, Neuromuscular re-education, Balance training, Gait training, Patient/Family education,    PLAN FOR NEXT SESSION: Continue with task practice for stepping with sound heel strike technique, postural control training, obstacle negotiation, multi-directional stepping; strategies to improve ability to reach low to floor including squatting and functional reaching. Continue with gait stability training. Introduce more stepping down motions negotiation of stairs and curbs. Recommend continued PT 2x/week for 4 weeks.    Valentina Gu, PT, DPT [phone] 01/26/2023 4:34 AM

## 2023-01-26 ENCOUNTER — Encounter: Payer: Self-pay | Admitting: Physical Therapy

## 2023-01-28 ENCOUNTER — Encounter: Payer: PPO | Admitting: Physical Therapy

## 2023-01-28 ENCOUNTER — Ambulatory Visit: Payer: PPO | Admitting: Physical Therapy

## 2023-01-28 DIAGNOSIS — R262 Difficulty in walking, not elsewhere classified: Secondary | ICD-10-CM

## 2023-01-28 DIAGNOSIS — R2689 Other abnormalities of gait and mobility: Secondary | ICD-10-CM | POA: Diagnosis not present

## 2023-01-28 DIAGNOSIS — M6281 Muscle weakness (generalized): Secondary | ICD-10-CM

## 2023-01-28 NOTE — Therapy (Signed)
OUTPATIENT PHYSICAL THERAPY TREATMENT    Patient Name: Laurie Roy MRN: [id number] DOB:[birth year], 82 y.o., female Today's Date: 01/28/2023   PCP: Sallee Lange, NP REFERRING PROVIDER: Sallee Lange, NP  END OF SESSION:   PT End of Session - 01/28/23 1123     Visit Number 25    Number of Visits 30    Date for PT Re-Evaluation 02/05/23    Progress Note Due on Visit 69    PT Start Time 1117    PT Stop Time 1159    PT Time Calculation (min) 42 min    Equipment Utilized During Treatment Gait belt    Activity Tolerance Patient tolerated treatment well    Behavior During Therapy WFL for tasks assessed/performed              Past Medical History:  Diagnosis Date   Breast cancer ([place]) [id number]   Complication of anesthesia    Heart murmur    Personal history of chemotherapy    Personal history of radiation therapy    PONV (postoperative nausea and vomiting)    Past Surgical History:  Procedure Laterality Date   APPLICATION OF WOUND VAC Left 12/10/2020   Procedure: APPLICATION OF PREVENA  WOUND VAC;  Surgeon: Dereck Leep, MD;  Location: [facility];  Service: Orthopedics;  Laterality: Left;   COLONOSCOPY     KNEE ARTHROPLASTY Left 12/10/2020   Procedure: COMPUTER ASSISTED TOTAL KNEE ARTHROPLASTY;  Surgeon: Dereck Leep, MD;  Location: [facility];  Service: Orthopedics;  Laterality: Left;   LEFT HEART CATH AND CORONARY ANGIOGRAPHY Left 04/16/2021   Procedure: LEFT HEART CATH AND CORONARY ANGIOGRAPHY;  Surgeon: Corey Skains, MD;  Location: [place] [facility];  Service: Cardiovascular;  Laterality: Left;   MASTECTOMY Left 2002   Patient Active Problem List   Diagnosis Date Noted   Angina pectoris ([place]) 04/16/2021   IGT (impaired glucose tolerance) 12/10/2020   Neuropathy 12/10/2020   Obesity (BMI 30-39.9) 12/10/2020   Primary osteoarthritis of left knee 09/16/2020   Primary osteoarthritis of right knee 09/16/2020   Polyarthralgia 03/26/2017   Vitamin  B12 deficiency 02/01/2017   Vitamin D deficiency 02/01/2017   HTN (hypertension) 02/17/2014   Hypercholesterolemia 02/17/2014    REFERRING DIAG:  G62.9 (ICD-10-CM) - Polyneuropathy, unspecified  R26.89 (ICD-10-CM) - Other abnormalities of gait and mobility    THERAPY DIAG:  Imbalance  Difficulty in walking, not elsewhere classified  Muscle weakness (generalized)  Rationale for Evaluation and Treatment Rehabilitation  PERTINENT HISTORY: Patient is an 82 year old female referred for balance and gait deficits with Hx of longstanding bilateral peripheral neuropathy, hx of L TKA (12/10/2020) and R knee pain secondary to OA. Patient reports SOB following panic attack during her L TKA rehab. Pt uses CPAP at night now for sleep apnea. She reports small heart blockage after that episode. She reports having increased difficulty walking during this workup with cardiology. Pt reports increased fluid accumulation from 230 lbs with her weight now being 179 lbs. Patient reports difficulty with walking at this time - she uses walker when going outside usually, especially on uneven ground. Pt finds herself walking without AD in home sometimes. Hx of vascular disease and peripheral neuropathy. Pt is notably challenged with fear of falling. Hx of dyspnea on exertion    Pain: Yes, R knee; doing better following cortisone injection  Numbness/Tingling: Yes, sensory change in hands and feet from neuropathy Focal Weakness: general LE weakness Recent changes in overall health/medication: Yes, heart  blockage found  Prior history of physical therapy for balance:  No Falls: Has patient fallen in last 6 months? Yes, Number of falls: 4, pt fell when riding lawn mower when ducking under limb (pt fell backward off of lawnmower). Pt fell in home 2x when bending over to grab item from floor, pt fell forward when getting out of bed.  Directional pattern for falls: Yes, forward falling when bending over  Imaging: Yes     Radiograph L knee 12/10/20 Status post total knee replacement with prosthetic components well-seated. No fra cture or dislocation. Acute postoperative changes noted.       Prior level of function: Independent Occupational demands: Retired  Office manager: Media planner (jewelry, painting)    Red flags (bowel/bladder changes, saddle paresthesia, personal history of cancer, h/o spinal tumors, h/o compression fx, h/o abdominal aneurysm, abdominal pain, chills/fever, night sweats, nausea, vomiting, unrelenting pain): Positive for Hx of breast cancer (in remission), otherwise no other red flags    Weight Bearing Restrictions: No   Living Environment Lives with: lives alone, daughter lives close by  Lives in: House/apartment Has following equipment at home: Single point cane, Environmental consultant - 4 wheeled, and Grab bars Pt uses step-to pattern to get up 14 steps in home 3 steps to get into home (gets up those easily)  Step-in shower, no shower bench; pt has suction grab bar     Patient Goals: Able to walk better, improve balance    PRECAUTIONS: Fall history                                                                                                                                                                   SUBJECTIVE STATEMENT: Pt reports no new incidents this week. Her son is in town and she has had several places to go with him this week. She reports completing higher volume of walking this past week without incident.    PAIN:  Are you having pain? No   OBJECTIVE: (objective measures completed at initial evaluation unless otherwise dated)   Patient Surveys  FOTO: 28, predicted improvement to 59 ABC: 62.5%     Cognition Patient is oriented to person, place, and time.  Recent memory is intact.  Remote memory is intact.  Attention span and concentration are intact.  Expressive speech is intact.  Patient's fund of knowledge is within normal limits for educational level.                             Observation Venous distension and varicose veins in bilat legs, color change. No inc tissue temperature, calf girth, pitting edema, or significant calf tenderness.     GAIT: Distance walked: 45 ft Assistive device utilized: Single point  cane, RUE Level of assistance: SBA Comments: Pt walks with RLE externally rotated, wide BOS, decreased gait cadence, diminished heel strike, slow velocity    Posture: Pt rests in posterior pelvic tilt. Grossly kyphotic posture. Pes planus. Genu valgus R>L knee.      LE MMT:   MMT (out of 5) Right 10/06/2022 Left 10/06/2022  Hip flexion 4 4  Hip extension      Hip abduction 5 5  Hip adduction 5 5  Hip internal rotation      Hip external rotation      Knee flexion 5 5  Knee extension 5 5  Ankle dorsiflexion 4 5  Ankle plantarflexion      Ankle inversion      Ankle eversion      (* = pain; Blank rows = not tested)     Sensation Decreased light touch sensation in bilateral toes and dorsum of feet     Reflexes Deferred     Cranial Nerves Visual acuity and visual fields are intact  Extraocular muscles are intact; pt does have difficulty tracking vertically from top of visual field back to neutral position  Facial sensation is intact bilaterally  Facial strength is intact bilaterally  Hearing is normal as tested by gross conversation Palate elevates midline, normal phonation  Shoulder shrug strength is intact  Tongue protrudes midline     Coordination/Cerebellar Finger to Nose: WNL Heel to Shin: WNL Rapid alternating movements: WNL Finger Opposition: WNL Pronator Drift: Negative     FUNCTIONAL OUTCOME MEASURES     Results Comments  BERG 10/08/22: 48/56 Fall risk, in need of intervention  TUG 10/06/22: 19.5 seconds     5TSTS 10/06/22: 17 seconds    DGI 10/08/22: 11/24    10 Meter Gait Speed 10/08/22: Self-selected: s = 0.59 m/s; Fastest: s = 0.69 m/s Below normative values for full community ambulation  (Blank rows  = not tested)          TODAY'S TREATMENT   Neuromuscular Re-education - for improved sensory integration, static and dynamic postural control, equilibrium and non-equilibrium coordination as needed for negotiating home and community environment and stepping over obstacles  In parallel bars:  Toe tapping on 6-inch step + Airex; 2x10 alternating R/L  Standing march on Airex pad; x20 alternating R/L  Multi-directional stepping (8 directions, anterior/anterolateral/lateral/posterolateral/posterior) on blue star marker; x4 each direction  Forward stepping over obstacles with a step-to pattern, (3) 6-inch hurdles, Airex pad, 6-inch step; 2x D/B 1/2 length of hallway    *next visit*  Toe tapping on cone   *not today* Standing in semi-tandem with postural perturbations, multi-directional; 2 x 1 minute (RLE in back and LLE in back) Standing in semi-tandem on Airex pad; 4x45 sec each position (RLE in back and LLE in back) Consecutive hurdle stepping with three 6-inch hurdles; x8 D/B length of // bars Adjacent to low mat:  Cone reaching, pt reaching outside of BOS to challenge static postural control; 2x10 cones for increased forward and cross-body reach  4 Step Staircase:  Stair negotiation facing forward with bilateral UE support; x5 .  Sit to stand with Airex under feet; additional Airex on chair to increase chair height; performed 3x10 Tandem standing on floor; 2x30 sec  Toe tapping, with bilat standing; alternating R/L 2x10 on 6-inch step with Airex pad on top - for fleeting unipedal stance as needed for obstacle negotiation  Standing single hurdle step; 2x10 with each LE, over dumbbell on floor for visual target   -  no UE support Tagging sticky notes on wall with ball; multi-directional reaching requiring bending/leaning; x 4 minutes - for forward reaching and having trunk stability while doing so. Balloon tapping for dynamic postural control and external perturbation; x 3  minutes  Airex pad standing march; 2x10 alternating Step up/down 3 consecutive Airex pads; 2x D/B length of parallel bars  -verbal cueing to limit pushing posterior thigh on edge of chair Functional reaching with cones; 10 cones on adjacent table; 2 trials of forward reaching and cross-body reach to place cone down with each upper limb   -for postural control with forward leaning and reaching tasks    Therapeutic Exercise - improved strength as needed to improve performance of CKC activities/functional movements and as needed for power production to prevent fall during episode of large postural perturbation, completion of performance-based outcome measures to detect fall risk and track progress   NuStep; x 5 minutes with Level 4 resistance, seat at 8 - for LE strengthening and lower limb mobility   -subjective information gathered during this time, 1 minute unbilled  Forward/backward stepping; 5x D/B blue agility ladder ;  - verbal cueing to limit toe-out and heel strike High knees, 4x D/B length of // bars   Forward step up to Airex pad; 2x10 on each LE  -pain with step up c RLE secondary to knee pain/OA    *not today* Squat to butt tap on edge of chair, with Airex on chair for increased seat height; 2x10 -verbal cueing and demo to reduce weightbearing on ischial tuberosities at bottom of squat Sidestepping with Blue Tband just superior to bilat patellae; 2x4 D/B length // bars Standing heel raise/toe raise; x10 alternating Standing march with light touch in single bar, in // bars; x10 alternating Sit to stand, reviewed      PATIENT EDUCATION:  Education details: see above for patient education details Person educated: Patient Education method: Explanation, Demonstration, and Handouts Education comprehension: verbalized understanding     HOME EXERCISE PROGRAM: Access Code: [email or web address] URL: [email or web address] Date: 10/12/2022 Prepared by: Valentina Gu  Exercises - Sit to Stand Without Arm Support  - 2 x daily - 7 x weekly - 2 sets - 10 reps - Heel Toe Raises with Unilateral Counter Support  - 2 x daily - 7 x weekly - 2 sets - 10 reps - Standing March with Counter Support  - 2 x daily - 7 x weekly - 2 sets - 10 reps      ASSESSMENT:   CLINICAL IMPRESSION: Patient does feel that her functional mobility and confidence with community-level gait has improved. Her son also gives positive appraisal to her current progress today. Patient is still notably challenged with negotiating obstacles and step/curbs. Her postural righting reactions have markedly improved and she is able to maintain balance well in uneven surface today. Patient has remaining deficits in impaired postural control, gait changes, difficulty with obstacle negotiation, hip and trunk control needed to prevent forward LOB with bending over/reaching to low-lying item. Patient will benefit from skilled PT to address above impairments and improve overall function.    REHAB POTENTIAL: Good   CLINICAL DECISION MAKING: Evolving/moderate complexity   EVALUATION COMPLEXITY: Moderate     GOALS:   SHORT TERM GOALS: Target date: 10/30/2022   Pt will be independent with HEP in order to improve strength and balance in order to decrease fall risk and improve function at home. Baseline: 11/17/22: Pt is compliant with HEP Goal status: ACHIEVED  LONG TERM GOALS: Target date: 12/04/2022   Pt will increase FOTO to at least 57 to demonstrate significant improvement in function at home related to balance  Baseline: 10/06/22: 54.   11/17/22: 48.   01/01/23: 53 Goal status: NOT MET    2.  Pt will improve BERG by at least 3 points in order to demonstrate clinically significant improvement in balance.   Baseline: 10/08/22: 48/56    11/17/22: 48/56    01/01/23: 50/56 Goal status: PARTIAL MET   3.  Pt will improve ABC by at least 13% in order to demonstrate clinically significant  improvement in balance confidence.      Baseline:  10/08/22: 62.5%.   11/17/22: 63.1%    01/01/23: 65.6% Goal status: NOT MET    4. Pt will decrease 5TSTS by at least 3 seconds in order to demonstrate clinically significant improvement in LE strength      Baseline: 10/06/22: 17 sec   11/17/22: 14.4 sec     01/01/23: 12.47 sec Goal status: ACHIEVED   5. Pt will improve DGI by at least 3 points in order to demonstrate clinically significant improvement in balance and decreased risk for falls.     Baseline: 10/08/22: 11/24.   11/17/22: 14/24    01/01/23: 17/24 Goal status: ACHIEVED   6. Pt will decrease TUG to below 14 seconds/decrease in order to demonstrate decreased fall risk.  Baseline: 10/06/22: 19.5 sec.   11/17/22: 16.3 sec    01/01/23: 14.4 sec Goal status: PARTIAL MET         PLAN: PT FREQUENCY: 2x/week   PT DURATION: 4 weeks   PLANNED INTERVENTIONS: Therapeutic exercises, Therapeutic activity, Neuromuscular re-education, Balance training, Gait training, Patient/Family education,    PLAN FOR NEXT SESSION: Continue with task practice for stepping with sound heel strike technique, postural control training, obstacle negotiation, multi-directional stepping; strategies to improve ability to reach low to floor including squatting and functional reaching. Continue with gait stability training. Introduce more stepping down motions negotiation of stairs and curbs.   Valentina Gu, PT, DPT [phone] 01/28/2023 11:24 AM

## 2023-01-29 ENCOUNTER — Encounter: Payer: Self-pay | Admitting: Physical Therapy

## 2023-01-30 DIAGNOSIS — G4733 Obstructive sleep apnea (adult) (pediatric): Secondary | ICD-10-CM | POA: Diagnosis not present

## 2023-02-05 ENCOUNTER — Other Ambulatory Visit: Payer: Self-pay | Admitting: Nurse Practitioner

## 2023-02-05 ENCOUNTER — Ambulatory Visit
Admission: RE | Admit: 2023-02-05 | Discharge: 2023-02-05 | Disposition: A | Discharge status: Home / Self Care | Payer: PPO | Source: Ambulatory Visit | Attending: Nurse Practitioner | Admitting: Nurse Practitioner

## 2023-02-05 DIAGNOSIS — Z1231 Encounter for screening mammogram for malignant neoplasm of breast: Secondary | ICD-10-CM | POA: Diagnosis not present

## 2023-02-06 ENCOUNTER — Encounter: Payer: Self-pay | Admitting: Nurse Practitioner

## 2023-02-10 ENCOUNTER — Other Ambulatory Visit: Payer: Self-pay | Admitting: Nurse Practitioner

## 2023-02-10 DIAGNOSIS — N6489 Other specified disorders of breast: Secondary | ICD-10-CM

## 2023-02-10 DIAGNOSIS — N6459 Other signs and symptoms in breast: Secondary | ICD-10-CM

## 2023-02-10 DIAGNOSIS — R928 Other abnormal and inconclusive findings on diagnostic imaging of breast: Secondary | ICD-10-CM

## 2023-02-11 DIAGNOSIS — G4733 Obstructive sleep apnea (adult) (pediatric): Secondary | ICD-10-CM | POA: Diagnosis not present

## 2023-02-11 DIAGNOSIS — I5022 Chronic systolic (congestive) heart failure: Secondary | ICD-10-CM | POA: Diagnosis not present

## 2023-02-11 DIAGNOSIS — Z853 Personal history of malignant neoplasm of breast: Secondary | ICD-10-CM | POA: Diagnosis not present

## 2023-02-11 DIAGNOSIS — E669 Obesity, unspecified: Secondary | ICD-10-CM | POA: Diagnosis not present

## 2023-02-11 DIAGNOSIS — E7800 Pure hypercholesterolemia, unspecified: Secondary | ICD-10-CM | POA: Diagnosis not present

## 2023-02-11 DIAGNOSIS — N1831 Chronic kidney disease, stage 3a: Secondary | ICD-10-CM | POA: Diagnosis not present

## 2023-02-11 DIAGNOSIS — I2510 Atherosclerotic heart disease of native coronary artery without angina pectoris: Secondary | ICD-10-CM | POA: Diagnosis not present

## 2023-02-11 DIAGNOSIS — R7989 Other specified abnormal findings of blood chemistry: Secondary | ICD-10-CM | POA: Diagnosis not present

## 2023-02-11 DIAGNOSIS — M1711 Unilateral primary osteoarthritis, right knee: Secondary | ICD-10-CM | POA: Diagnosis not present

## 2023-02-11 DIAGNOSIS — G629 Polyneuropathy, unspecified: Secondary | ICD-10-CM | POA: Diagnosis not present

## 2023-02-11 DIAGNOSIS — I10 Essential (primary) hypertension: Secondary | ICD-10-CM | POA: Diagnosis not present

## 2023-02-11 DIAGNOSIS — R946 Abnormal results of thyroid function studies: Secondary | ICD-10-CM | POA: Diagnosis not present

## 2023-02-11 DIAGNOSIS — R7302 Impaired glucose tolerance (oral): Secondary | ICD-10-CM | POA: Diagnosis not present

## 2023-02-16 ENCOUNTER — Ambulatory Visit: Payer: PPO | Attending: Nurse Practitioner

## 2023-02-16 DIAGNOSIS — R2689 Other abnormalities of gait and mobility: Secondary | ICD-10-CM | POA: Diagnosis not present

## 2023-02-16 DIAGNOSIS — R262 Difficulty in walking, not elsewhere classified: Secondary | ICD-10-CM | POA: Diagnosis not present

## 2023-02-16 DIAGNOSIS — M6281 Muscle weakness (generalized): Secondary | ICD-10-CM | POA: Insufficient documentation

## 2023-02-16 NOTE — Therapy (Signed)
OUTPATIENT PHYSICAL THERAPY TREATMENT    Patient Name: Laurie Roy MRN: [id number] DOB:21-Jan-1941, 82 y.o., female Today's Date: 02/16/2023   PCP: Myrene Buddy, NP REFERRING PROVIDER: Myrene Buddy, NP  END OF SESSION:   PT End of Session - 02/16/23 1732     Visit Number 26    Number of Visits 30    Date for PT Re-Evaluation 02/05/23    Authorization Type HTA, visit limit based on medical necessity; no auth    Progress Note Due on Visit 30    PT Start Time 0226    PT Stop Time 0319    PT Time Calculation (min) 53 min    Equipment Utilized During Treatment Gait belt    Activity Tolerance Patient tolerated treatment well    Behavior During Therapy WFL for tasks assessed/performed              Past Medical History:  Diagnosis Date   Breast cancer 2002   Complication of anesthesia    Heart murmur    Personal history of chemotherapy    Personal history of radiation therapy    PONV (postoperative nausea and vomiting)    Past Surgical History:  Procedure Laterality Date   APPLICATION OF WOUND VAC Left 12/10/2020   Procedure: APPLICATION OF PREVENA  WOUND VAC;  Surgeon: Donato Heinz, MD;  Location: [facility];  Service: Orthopedics;  Laterality: Left;   COLONOSCOPY     KNEE ARTHROPLASTY Left 12/10/2020   Procedure: COMPUTER ASSISTED TOTAL KNEE ARTHROPLASTY;  Surgeon: Donato Heinz, MD;  Location: [facility];  Service: Orthopedics;  Laterality: Left;   LEFT HEART CATH AND CORONARY ANGIOGRAPHY Left 04/16/2021   Procedure: LEFT HEART CATH AND CORONARY ANGIOGRAPHY;  Surgeon: Lamar Blinks, MD;  Location: [facility];  Service: Cardiovascular;  Laterality: Left;   MASTECTOMY Left 2002   Patient Active Problem List   Diagnosis Date Noted   Angina pectoris 04/16/2021   IGT (impaired glucose tolerance) 12/10/2020   Neuropathy 12/10/2020   Obesity (BMI 30-39.9) 12/10/2020   Primary osteoarthritis of left knee 09/16/2020   Primary osteoarthritis of  right knee 09/16/2020   Polyarthralgia 03/26/2017   Vitamin B12 deficiency 02/01/2017   Vitamin D deficiency 02/01/2017   HTN (hypertension) 02/17/2014   Hypercholesterolemia 02/17/2014    REFERRING DIAG:  G62.9 (ICD-10-CM) - Polyneuropathy, unspecified  R26.89 (ICD-10-CM) - Other abnormalities of gait and mobility    THERAPY DIAG:  Imbalance  Difficulty in walking, not elsewhere classified  Muscle weakness (generalized)  Rationale for Evaluation and Treatment Rehabilitation  PERTINENT HISTORY: Patient is an 82 year old female referred for balance and gait deficits with Hx of longstanding bilateral peripheral neuropathy, hx of L TKA (12/10/2020) and R knee pain secondary to OA. Patient reports SOB following panic attack during her L TKA rehab. Pt uses CPAP at night now for sleep apnea. She reports small heart blockage after that episode. She reports having increased difficulty walking during this workup with cardiology. Pt reports increased fluid accumulation from 230 lbs with her weight now being 179 lbs. Patient reports difficulty with walking at this time - she uses walker when going outside usually, especially on uneven ground. Pt finds herself walking without AD in home sometimes. Hx of vascular disease and peripheral neuropathy. Pt is notably challenged with fear of falling. Hx of dyspnea on exertion    Pain: Yes, R knee; doing better following cortisone injection  Numbness/Tingling: Yes, sensory change in hands and feet from neuropathy  Focal Weakness: general LE weakness Recent changes in overall health/medication: Yes, heart blockage found  Prior history of physical therapy for balance:  No Falls: Has patient fallen in last 6 months? Yes, Number of falls: 4, pt fell when riding lawn mower when ducking under limb (pt fell backward off of lawnmower). Pt fell in home 2x when bending over to grab item from floor, pt fell forward when getting out of bed.  Directional pattern for falls:  Yes, forward falling when bending over  Imaging: Yes    Radiograph L knee 12/10/20 Status post total knee replacement with prosthetic components well-seated. No fra cture or dislocation. Acute postoperative changes noted.       Prior level of function: Independent Occupational demands: Retired  Presenter, broadcasting: Industrial/product designer (jewelry, painting)    Red flags (bowel/bladder changes, saddle paresthesia, personal history of cancer, h/o spinal tumors, h/o compression fx, h/o abdominal aneurysm, abdominal pain, chills/fever, night sweats, nausea, vomiting, unrelenting pain): Positive for Hx of breast cancer (in remission), otherwise no other red flags    Weight Bearing Restrictions: No   Living Environment Lives with: lives alone, daughter lives close by  Lives in: House/apartment Has following equipment at home: Single point cane, Environmental consultant - 4 wheeled, and Grab bars Pt uses step-to pattern to get up 14 steps in home 3 steps to get into home (gets up those easily)  Step-in shower, no shower bench; pt has suction grab bar     Patient Goals: Able to walk better, improve balance    PRECAUTIONS: Fall history                                                                                                                                                                   SUBJECTIVE STATEMENT: Pt reports no new incidents this week. Her son is in town and she has had several places to go with him this week. She reports completing higher volume of walking this past week without incident.    PAIN:  Are you having pain? No   OBJECTIVE: (objective measures completed at initial evaluation unless otherwise dated)   Patient Surveys  FOTO: 76, predicted improvement to 36 ABC: 62.5%     Cognition Patient is oriented to person, place, and time.  Recent memory is intact.  Remote memory is intact.  Attention span and concentration are intact.  Expressive speech is intact.  Patient's fund of knowledge is  within normal limits for educational level.                            Observation Venous distension and varicose veins in bilat legs, color change. No inc tissue temperature, calf girth, pitting edema, or significant calf tenderness.  GAIT: Distance walked: 45 ft Assistive device utilized: Single point cane, RUE Level of assistance: SBA Comments: Pt walks with RLE externally rotated, wide BOS, decreased gait cadence, diminished heel strike, slow velocity    Posture: Pt rests in posterior pelvic tilt. Grossly kyphotic posture. Pes planus. Genu valgus R>L knee.      LE MMT:   MMT (out of 5) Right 10/06/2022 Left 10/06/2022  Hip flexion 4 4  Hip extension      Hip abduction 5 5  Hip adduction 5 5  Hip internal rotation      Hip external rotation      Knee flexion 5 5  Knee extension 5 5  Ankle dorsiflexion 4 5  Ankle plantarflexion      Ankle inversion      Ankle eversion      (* = pain; Blank rows = not tested)     Sensation Decreased light touch sensation in bilateral toes and dorsum of feet     Reflexes Deferred     Cranial Nerves Visual acuity and visual fields are intact  Extraocular muscles are intact; pt does have difficulty tracking vertically from top of visual field back to neutral position  Facial sensation is intact bilaterally  Facial strength is intact bilaterally  Hearing is normal as tested by gross conversation Palate elevates midline, normal phonation  Shoulder shrug strength is intact  Tongue protrudes midline     Coordination/Cerebellar Finger to Nose: WNL Heel to Shin: WNL Rapid alternating movements: WNL Finger Opposition: WNL Pronator Drift: Negative     FUNCTIONAL OUTCOME MEASURES     Results Comments  BERG 10/08/22: 48/56 Fall risk, in need of intervention  TUG 10/06/22: 19.5 seconds     5TSTS 10/06/22: 17 seconds    DGI 10/08/22: 11/24    10 Meter Gait Speed 10/08/22: Self-selected: s = 0.59 m/s; Fastest: s = 0.69 m/s Below  normative values for full community ambulation  (Blank rows = not tested)          TODAY'S TREATMENT   TE: 20 mins:   Sci-Fit for 10 mins at level 4 Bridges 2 x 60 secs Bridges with Abd GTB 2 x 30 secs Bridges with ball squeeze 2 x 30 secs STS with 2 x 60 secs Mini Squats 2 x 30 secs   Neuromuscular Re-education 33- for improved sensory integration, static and dynamic postural control, equilibrium and non-equilibrium coordination as needed for negotiating home and community environment and stepping over obstacles  In parallel bars:  Toe tapping on 6-inch step + Airex; 2x10 alternating R/L  Standing march on Airex pad; x20 alternating R/L  Multi-directional stepping (8 directions, anterior/anterolateral/lateral/posterolateral/posterior) on blue star marker; x4 each direction  Forward stepping over obstacles with a step-to pattern, (3) 6-inch hurdles, Airex pad, 6-inch step; 2x D/B 1/2 length of hallway    *next visit*  Toe tapping on cone   *not today* Standing in semi-tandem with postural perturbations, multi-directional; 2 x 1 minute (RLE in back and LLE in back) Standing in semi-tandem on Airex pad; 4x45 sec each position (RLE in back and LLE in back) Consecutive hurdle stepping with three 6-inch hurdles; x8 D/B length of // bars Adjacent to low mat:  Cone reaching, pt reaching outside of BOS to challenge static postural control; 2x10 cones for increased forward and cross-body reach  4 Step Staircase:  Stair negotiation facing forward with bilateral UE support; x5 .  Sit to stand with Airex under feet; additional Airex on  chair to increase chair height; performed 3x10 Tandem standing on floor; 2x30 sec  Toe tapping, with bilat standing; alternating R/L 2x10 on 6-inch step with Airex pad on top - for fleeting unipedal stance as needed for obstacle negotiation  Standing single hurdle step; 2x10 with each LE, over dumbbell on floor for visual target   -no UE  support Tagging sticky notes on wall with ball; multi-directional reaching requiring bending/leaning; x 4 minutes - for forward reaching and having trunk stability while doing so. Balloon tapping for dynamic postural control and external perturbation; x 3 minutes  Airex pad standing march; 2x10 alternating Step up/down 3 consecutive Airex pads; 2x D/B length of parallel bars  -verbal cueing to limit pushing posterior thigh on edge of chair Functional reaching with cones; 10 cones on adjacent table; 2 trials of forward reaching and cross-body reach to place cone down with each upper limb   -for postural control with forward leaning and reaching tasks    Therapeutic Exercise - improved strength as needed to improve performance of CKC activities/functional movements and as needed for power production to prevent fall during episode of large postural perturbation, completion of performance-based outcome measures to detect fall risk and track progress   NuStep; x 5 minutes with Level 4 resistance, seat at 8 - for LE strengthening and lower limb mobility   -subjective information gathered during this time, 1 minute unbilled  Forward/backward stepping; 5x D/B blue agility ladder ;  - verbal cueing to limit toe-out and heel strike High knees, 4x D/B length of // bars   Forward step up to Airex pad; 2x10 on each LE  -pain with step up c RLE secondary to knee pain/OA    *not today* Squat to butt tap on edge of chair, with Airex on chair for increased seat height; 2x10 -verbal cueing and demo to reduce weightbearing on ischial tuberosities at bottom of squat Sidestepping with Blue Tband just superior to bilat patellae; 2x4 D/B length // bars Standing heel raise/toe raise; x10 alternating Standing march with light touch in single bar, in // bars; x10 alternating Sit to stand, reviewed      PATIENT EDUCATION:  Education details: see above for patient education details Person educated:  Patient Education method: Explanation, Demonstration, and Handouts Education comprehension: verbalized understanding     HOME EXERCISE PROGRAM: Access Code: [email or web address] URL: [email or web address] Date: 10/12/2022 Prepared by: Consuela Mimes  Exercises - Sit to Stand Without Arm Support  - 2 x daily - 7 x weekly - 2 sets - 10 reps - Heel Toe Raises with Unilateral Counter Support  - 2 x daily - 7 x weekly - 2 sets - 10 reps - Standing March with Counter Support  - 2 x daily - 7 x weekly - 2 sets - 10 reps      ASSESSMENT:   CLINICAL IMPRESSION: AS per Patient, she presents with increased  unsteady gait as compared to last session. Pt has days where her unsteadiness is worse then other without known cause. Pt required multiple rest breaks with all activities. Pt remained motivated though out the session. PT remained focused on strengthening ,  stability and balance exs. PT advised pt to use cane at all time especially when the balance is poor. Pt demonstrated good understanding.   Patient will benefit from skilled PT to address above impairments and improve overall function.    REHAB POTENTIAL: Good   CLINICAL DECISION MAKING: Evolving/moderate complexity   EVALUATION COMPLEXITY: Moderate  GOALS:   SHORT TERM GOALS: Target date: 10/30/2022   Pt will be independent with HEP in order to improve strength and balance in order to decrease fall risk and improve function at home. Baseline: 11/17/22: Pt is compliant with HEP Goal status: ACHIEVED     LONG TERM GOALS: Target date: 12/04/2022   Pt will increase FOTO to at least 57 to demonstrate significant improvement in function at home related to balance  Baseline: 10/06/22: 54.   11/17/22: 48.   01/01/23: 53 Goal status: NOT MET    2.  Pt will improve BERG by at least 3 points in order to demonstrate clinically significant improvement in balance.   Baseline: 10/08/22: 48/56    11/17/22: 48/56    01/01/23: 50/56 Goal  status: PARTIAL MET   3.  Pt will improve ABC by at least 13% in order to demonstrate clinically significant improvement in balance confidence.      Baseline:  10/08/22: 62.5%.   11/17/22: 63.1%    01/01/23: 65.6% Goal status: NOT MET    4. Pt will decrease 5TSTS by at least 3 seconds in order to demonstrate clinically significant improvement in LE strength      Baseline: 10/06/22: 17 sec   11/17/22: 14.4 sec     01/01/23: 12.47 sec Goal status: ACHIEVED   5. Pt will improve DGI by at least 3 points in order to demonstrate clinically significant improvement in balance and decreased risk for falls.     Baseline: 10/08/22: 11/24.   11/17/22: 14/24    01/01/23: 17/24 Goal status: ACHIEVED   6. Pt will decrease TUG to below 14 seconds/decrease in order to demonstrate decreased fall risk.  Baseline: 10/06/22: 19.5 sec.   11/17/22: 16.3 sec    01/01/23: 14.4 sec Goal status: PARTIAL MET         PLAN: PT FREQUENCY: 2x/week   PT DURATION: 4 weeks   PLANNED INTERVENTIONS: Therapeutic exercises, Therapeutic activity, Neuromuscular re-education, Balance training, Gait training, Patient/Family education,    PLAN FOR NEXT SESSION: Continue with task practice for stepping with sound heel strike technique, postural control training, obstacle negotiation, multi-directional stepping; strategies to improve ability to reach low to floor including squatting and functional reaching. Continue with gait stability training. Introduce more stepping down motions negotiation of stairs and curbs.   Encounter Date: 02/16/2023   Janet Berlin PT DPT 5:43 PM,02/16/23 [facility] [address] [place], [place], [address] Phone: [phone]   Fax:  [phone]

## 2023-02-17 ENCOUNTER — Ambulatory Visit
Admission: RE | Admit: 2023-02-17 | Discharge: 2023-02-17 | Disposition: A | Discharge status: Home / Self Care | Payer: PPO | Source: Ambulatory Visit | Attending: Nurse Practitioner | Admitting: Nurse Practitioner

## 2023-02-17 DIAGNOSIS — N6489 Other specified disorders of breast: Secondary | ICD-10-CM | POA: Diagnosis not present

## 2023-02-17 DIAGNOSIS — N6459 Other signs and symptoms in breast: Secondary | ICD-10-CM | POA: Diagnosis not present

## 2023-02-17 DIAGNOSIS — R928 Other abnormal and inconclusive findings on diagnostic imaging of breast: Secondary | ICD-10-CM

## 2023-02-18 ENCOUNTER — Ambulatory Visit: Payer: PPO

## 2023-02-18 DIAGNOSIS — M6281 Muscle weakness (generalized): Secondary | ICD-10-CM

## 2023-02-18 DIAGNOSIS — R2689 Other abnormalities of gait and mobility: Secondary | ICD-10-CM

## 2023-02-18 DIAGNOSIS — R262 Difficulty in walking, not elsewhere classified: Secondary | ICD-10-CM

## 2023-02-18 NOTE — Therapy (Signed)
OUTPATIENT PHYSICAL THERAPY TREATMENT    Patient Name: Laurie Roy MRN: [id number] DOB:11/24/1940, 82 y.o., female Today's Date: 02/18/2023   PCP: Myrene Buddy, NP REFERRING PROVIDER: Myrene Buddy, NP  END OF SESSION:      Past Medical History:  Diagnosis Date   Breast cancer 2002   Complication of anesthesia    Heart murmur    Personal history of chemotherapy    Personal history of radiation therapy    PONV (postoperative nausea and vomiting)    Past Surgical History:  Procedure Laterality Date   APPLICATION OF WOUND VAC Left 12/10/2020   Procedure: APPLICATION OF PREVENA  WOUND VAC;  Surgeon: Donato Heinz, MD;  Location: [facility];  Service: Orthopedics;  Laterality: Left;   COLONOSCOPY     KNEE ARTHROPLASTY Left 12/10/2020   Procedure: COMPUTER ASSISTED TOTAL KNEE ARTHROPLASTY;  Surgeon: Donato Heinz, MD;  Location: [facility];  Service: Orthopedics;  Laterality: Left;   LEFT HEART CATH AND CORONARY ANGIOGRAPHY Left 04/16/2021   Procedure: LEFT HEART CATH AND CORONARY ANGIOGRAPHY;  Surgeon: Lamar Blinks, MD;  Location: [facility];  Service: Cardiovascular;  Laterality: Left;   MASTECTOMY Left 2002   Patient Active Problem List   Diagnosis Date Noted   Angina pectoris 04/16/2021   IGT (impaired glucose tolerance) 12/10/2020   Neuropathy 12/10/2020   Obesity (BMI 30-39.9) 12/10/2020   Primary osteoarthritis of left knee 09/16/2020   Primary osteoarthritis of right knee 09/16/2020   Polyarthralgia 03/26/2017   Vitamin B12 deficiency 02/01/2017   Vitamin D deficiency 02/01/2017   HTN (hypertension) 02/17/2014   Hypercholesterolemia 02/17/2014    REFERRING DIAG:  G62.9 (ICD-10-CM) - Polyneuropathy, unspecified  R26.89 (ICD-10-CM) - Other abnormalities of gait and mobility    THERAPY DIAG:  Imbalance  Muscle weakness (generalized)  Difficulty in walking, not elsewhere classified  Rationale for Evaluation and Treatment  Rehabilitation  PERTINENT HISTORY: Patient is an 82 year old female referred for balance and gait deficits with Hx of longstanding bilateral peripheral neuropathy, hx of L TKA (12/10/2020) and R knee pain secondary to OA. Patient reports SOB following panic attack during her L TKA rehab. Pt uses CPAP at night now for sleep apnea. She reports small heart blockage after that episode. She reports having increased difficulty walking during this workup with cardiology. Pt reports increased fluid accumulation from 230 lbs with her weight now being 179 lbs. Patient reports difficulty with walking at this time - she uses walker when going outside usually, especially on uneven ground. Pt finds herself walking without AD in home sometimes. Hx of vascular disease and peripheral neuropathy. Pt is notably challenged with fear of falling. Hx of dyspnea on exertion    Pain: Yes, R knee; doing better following cortisone injection  Numbness/Tingling: Yes, sensory change in hands and feet from neuropathy Focal Weakness: general LE weakness Recent changes in overall health/medication: Yes, heart blockage found  Prior history of physical therapy for balance:  No Falls: Has patient fallen in last 6 months? Yes, Number of falls: 4, pt fell when riding lawn mower when ducking under limb (pt fell backward off of lawnmower). Pt fell in home 2x when bending over to grab item from floor, pt fell forward when getting out of bed.  Directional pattern for falls: Yes, forward falling when bending over  Imaging: Yes    Radiograph L knee 12/10/20 Status post total knee replacement with prosthetic components well-seated. No fra cture or dislocation. Acute postoperative changes noted.  Prior level of function: Independent Occupational demands: Retired  Presenter, broadcasting: Industrial/product designer (jewelry, painting)    Red flags (bowel/bladder changes, saddle paresthesia, personal history of cancer, h/o spinal tumors, h/o compression fx, h/o abdominal  aneurysm, abdominal pain, chills/fever, night sweats, nausea, vomiting, unrelenting pain): Positive for Hx of breast cancer (in remission), otherwise no other red flags    Weight Bearing Restrictions: No   Living Environment Lives with: lives alone, daughter lives close by  Lives in: House/apartment Has following equipment at home: Single point cane, Environmental consultant - 4 wheeled, and Grab bars Pt uses step-to pattern to get up 14 steps in home 3 steps to get into home (gets up those easily)  Step-in shower, no shower bench; pt has suction grab bar     Patient Goals: Able to walk better, improve balance    PRECAUTIONS: Fall history                                                                                                                                                                   SUBJECTIVE STATEMENT: Pt reported of feeling better then Monday but still not steady on my feet." I have a blockage in my heart and my BP was a little high today."    PAIN:  Are you having pain? No   OBJECTIVE: (objective measures completed at initial evaluation unless otherwise dated)   Patient Surveys  FOTO: 93, predicted improvement to 45 ABC: 62.5%     Cognition Patient is oriented to person, place, and time.  Recent memory is intact.  Remote memory is intact.  Attention span and concentration are intact.  Expressive speech is intact.  Patient's fund of knowledge is within normal limits for educational level.                            Observation Venous distension and varicose veins in bilat legs, color change. No inc tissue temperature, calf girth, pitting edema, or significant calf tenderness.     GAIT: Distance walked: 45 ft Assistive device utilized: Single point cane, RUE Level of assistance: SBA Comments: Pt walks with RLE externally rotated, wide BOS, decreased gait cadence, diminished heel strike, slow velocity    Posture: Pt rests in posterior pelvic tilt. Grossly kyphotic  posture. Pes planus. Genu valgus R>L knee.      LE MMT:   MMT (out of 5) Right 10/06/2022 Left 10/06/2022  Hip flexion 4 4  Hip extension      Hip abduction 5 5  Hip adduction 5 5  Hip internal rotation      Hip external rotation      Knee flexion 5 5  Knee extension 5 5  Ankle dorsiflexion 4 5  Ankle  plantarflexion      Ankle inversion      Ankle eversion      (* = pain; Blank rows = not tested)     Sensation Decreased light touch sensation in bilateral toes and dorsum of feet     Reflexes Deferred     Cranial Nerves Visual acuity and visual fields are intact  Extraocular muscles are intact; pt does have difficulty tracking vertically from top of visual field back to neutral position  Facial sensation is intact bilaterally  Facial strength is intact bilaterally  Hearing is normal as tested by gross conversation Palate elevates midline, normal phonation  Shoulder shrug strength is intact  Tongue protrudes midline     Coordination/Cerebellar Finger to Nose: WNL Heel to Shin: WNL Rapid alternating movements: WNL Finger Opposition: WNL Pronator Drift: Negative     FUNCTIONAL OUTCOME MEASURES     Results Comments  BERG 10/08/22: 48/56 Fall risk, in need of intervention  TUG 10/06/22: 19.5 seconds     5TSTS 10/06/22: 17 seconds    DGI 10/08/22: 11/24    10 Meter Gait Speed 10/08/22: Self-selected: s = 0.59 m/s; Fastest: s = 0.69 m/s Below normative values for full community ambulation  (Blank rows = not tested)          TODAY'S TREATMENT  TA: 30 mins:   Sci-Fit for 10 mins at level 2  BP 155/118 HR 66 therefore the Treatment was terminated.  PT walked pt to the car and then to the [facility] MD office for check up.    Not Performed:  Bridges 2 x 60 secs Bridges with Abd GTB 2 x 30 secs Bridges with ball squeeze 2 x 30 secs STS with 2 x 60 secs Mini Squats 2 x 30 secs 3 way hip with #1 2 x 60 secs each   Neuromuscular Re-education   I n  parallel bars:  Toe tapping on 6-inch step + Airex; 2x10 alternating R/L  Standing march on Airex pad; x20 alternating R/L  Multi-directional stepping (8 directions, anterior/anterolateral/lateral/posterolateral/posterior) on blue star marker; x4 each direction  Forward stepping over obstacles with a step-to pattern, (3) 6-inch hurdles, Airex pad, 6-inch step; 2x D/B 1/2 length of hallway *not today* Standing in semi-tandem with postural perturbations, multi-directional; 2 x 1 minute (RLE in back and LLE in back) Standing in semi-tandem on Airex pad; 4x45 sec each position (RLE in back and LLE in back) Consecutive hurdle stepping with three 6-inch hurdles; x8 D/B length of // bars Adjacent to low mat:  Cone reaching, pt reaching outside of BOS to challenge static postural control; 2x10 cones for increased forward and cross-body reach  4 Step Staircase:  Stair negotiation facing forward with bilateral UE support; x5 .  Sit to stand with Airex under feet; additional Airex on chair to increase chair height; performed 3x10 Tandem standing on floor; 2x30 sec  Toe tapping, with bilat standing; alternating R/L 2x10 on 6-inch step with Airex pad on top - for fleeting unipedal stance as needed for obstacle negotiation  Standing single hurdle step; 2x10 with each LE, over dumbbell on floor for visual target   -no UE support Tagging sticky notes on wall with ball; multi-directional reaching requiring bending/leaning; x 4 minutes - for forward reaching and having trunk stability while doing so. Balloon tapping for dynamic postural control and external perturbation; x 3 minutes  Airex pad standing march; 2x10 alternating Step up/down 3 consecutive Airex pads; 2x D/B length of parallel bars  -  verbal cueing to limit pushing posterior thigh on edge of chair Functional reaching with cones; 10 cones on adjacent table; 2 trials of forward reaching and cross-body reach to place cone down with each upper  limb   -for postural control with forward leaning and reaching tasks    Therapeutic Exercise - improved strength as needed to improve performance of CKC activities/functional movements and as needed for power production to prevent fall during episode of large postural perturbation, completion of performance-based outcome measures to detect fall risk and track progress   NuStep; x 5 minutes with Level 4 resistance, seat at 8 - for LE strengthening and lower limb mobility   -subjective information gathered during this time, 1 minute unbilled  Forward/backward stepping; 5x D/B blue agility ladder ;  - verbal cueing to limit toe-out and heel strike High knees, 4x D/B length of // bars   Forward step up to Airex pad; 2x10 on each LE  -pain with step up c RLE secondary to knee pain/OA  Squat to butt tap on edge of chair, with Airex on chair for increased seat height; 2x10 -verbal cueing and demo to reduce weightbearing on ischial tuberosities at bottom of squat Sidestepping with Blue Tband just superior to bilat patellae; 2x4 D/B length // bars Standing heel raise/toe raise; x10 alternating Standing march with light touch in single bar, in // bars; x10 alternating Sit to stand, reviewed      PATIENT EDUCATION:  Education details: see above for patient education details Person educated: Patient Education method: Explanation, Demonstration, and Handouts Education comprehension: verbalized understanding     HOME EXERCISE PROGRAM: Access Code: [email or web address] URL: [email or web address] Date: 10/12/2022 Prepared by: Consuela Mimes  Exercises - Sit to Stand Without Arm Support  - 2 x daily - 7 x weekly - 2 sets - 10 reps - Heel Toe Raises with Unilateral Counter Support  - 2 x daily - 7 x weekly - 2 sets - 10 reps - Standing March with Counter Support  - 2 x daily - 7 x weekly - 2 sets - 10 reps      ASSESSMENT:   CLINICAL IMPRESSION: As per Patient, she presents with  continued  unsteady gait but improvement from last session. as compared to last session. Based on the pt's subjective report, PT checked pt's BP twice to make sure there was no machine error . It revealed diastolic pressure 118 with MET 127 which is above APTA guidelines. Pt drove to the clinic and therefore gave pt a choice to be sent to ER and pt choose to see her MD at [facility] clinic across from the PT clinic. PT walked pt to the car and communicated with the office staff regarding the findings. Pt's Nurse called back after assessing the pt that pt is OK and has returned home. Patient will benefit from skilled PT to address above impairments and improve overall function.    REHAB POTENTIAL: Good   CLINICAL DECISION MAKING: Evolving/moderate complexity   EVALUATION COMPLEXITY: Moderate     GOALS:   SHORT TERM GOALS: Target date: 10/30/2022   Pt will be independent with HEP in order to improve strength and balance in order to decrease fall risk and improve function at home. Baseline: 11/17/22: Pt is compliant with HEP Goal status: ACHIEVED     LONG TERM GOALS: Target date: 12/04/2022   Pt will increase FOTO to at least 57 to demonstrate significant improvement in function at home related to balance  Baseline: 10/06/22: 54.  11/17/22: 48.   01/01/23: 53 Goal status: NOT MET    2.  Pt will improve BERG by at least 3 points in order to demonstrate clinically significant improvement in balance.   Baseline: 10/08/22: 48/56    11/17/22: 48/56    01/01/23: 50/56 Goal status: PARTIAL MET   3.  Pt will improve ABC by at least 13% in order to demonstrate clinically significant improvement in balance confidence.      Baseline:  10/08/22: 62.5%.   11/17/22: 63.1%    01/01/23: 65.6% Goal status: NOT MET    4. Pt will decrease 5TSTS by at least 3 seconds in order to demonstrate clinically significant improvement in LE strength      Baseline: 10/06/22: 17 sec   11/17/22: 14.4 sec     01/01/23: 12.47  sec Goal status: ACHIEVED   5. Pt will improve DGI by at least 3 points in order to demonstrate clinically significant improvement in balance and decreased risk for falls.     Baseline: 10/08/22: 11/24.   11/17/22: 14/24    01/01/23: 17/24 Goal status: ACHIEVED   6. Pt will decrease TUG to below 14 seconds/decrease in order to demonstrate decreased fall risk.  Baseline: 10/06/22: 19.5 sec.   11/17/22: 16.3 sec    01/01/23: 14.4 sec Goal status: PARTIAL MET         PLAN: PT FREQUENCY: 2x/week   PT DURATION: 4 weeks   PLANNED INTERVENTIONS: Therapeutic exercises, Therapeutic activity, Neuromuscular re-education, Balance training, Gait training, Patient/Family education,    PLAN FOR NEXT SESSION: Continue with task practice for stepping with sound heel strike technique, postural control training, obstacle negotiation, multi-directional stepping; strategies to improve ability to reach low to floor including squatting and functional reaching. Continue with gait stability training. Introduce more stepping down motions negotiation of stairs and curbs. Encounter Date: 02/18/2023 Janet Berlin PT DPT 4:56 PM,02/18/23   [facility] [address] [place], [place], [address] Phone: [phone]   Fax:  [phone]

## 2023-02-23 ENCOUNTER — Ambulatory Visit: Payer: PPO | Admitting: Physical Therapy

## 2023-02-23 ENCOUNTER — Encounter: Payer: Self-pay | Admitting: Physical Therapy

## 2023-02-23 VITALS — BP 106/70 | HR 59

## 2023-02-23 DIAGNOSIS — R262 Difficulty in walking, not elsewhere classified: Secondary | ICD-10-CM

## 2023-02-23 DIAGNOSIS — M6281 Muscle weakness (generalized): Secondary | ICD-10-CM

## 2023-02-23 DIAGNOSIS — R2689 Other abnormalities of gait and mobility: Secondary | ICD-10-CM | POA: Diagnosis not present

## 2023-02-23 NOTE — Therapy (Signed)
OUTPATIENT PHYSICAL THERAPY TREATMENT    Patient Name: Laurie Roy MRN: [id number] DOB:1941-01-06, 82 y.o., female Today's Date: 02/23/2023   PCP: Myrene Buddy, NP REFERRING PROVIDER: Myrene Buddy, NP  END OF SESSION:   PT End of Session - 02/23/23 1444     Visit Number 28    Number of Visits 30    Date for PT Re-Evaluation 02/05/23    Authorization Type HTA, visit limit based on medical necessity; no auth    Progress Note Due on Visit 30    PT Start Time 1423    PT Stop Time 1503    PT Time Calculation (min) 40 min    Equipment Utilized During Treatment Gait belt    Activity Tolerance Treatment limited secondary to medical complications (Comment)    Behavior During Therapy [facility] for tasks assessed/performed               Past Medical History:  Diagnosis Date   Breast cancer 2002   Complication of anesthesia    Heart murmur    Personal history of chemotherapy    Personal history of radiation therapy    PONV (postoperative nausea and vomiting)    Past Surgical History:  Procedure Laterality Date   APPLICATION OF WOUND VAC Left 12/10/2020   Procedure: APPLICATION OF PREVENA  WOUND VAC;  Surgeon: Donato Heinz, MD;  Location: [facility] ORS;  Service: Orthopedics;  Laterality: Left;   COLONOSCOPY     KNEE ARTHROPLASTY Left 12/10/2020   Procedure: COMPUTER ASSISTED TOTAL KNEE ARTHROPLASTY;  Surgeon: Donato Heinz, MD;  Location: [facility] ORS;  Service: Orthopedics;  Laterality: Left;   LEFT HEART CATH AND CORONARY ANGIOGRAPHY Left 04/16/2021   Procedure: LEFT HEART CATH AND CORONARY ANGIOGRAPHY;  Surgeon: Lamar Blinks, MD;  Location: [facility] INVASIVE CV LAB;  Service: Cardiovascular;  Laterality: Left;   MASTECTOMY Left 2002   Patient Active Problem List   Diagnosis Date Noted   Angina pectoris 04/16/2021   IGT (impaired glucose tolerance) 12/10/2020   Neuropathy 12/10/2020   Obesity (BMI 30-39.9) 12/10/2020   Primary osteoarthritis of left knee  09/16/2020   Primary osteoarthritis of right knee 09/16/2020   Polyarthralgia 03/26/2017   Vitamin B12 deficiency 02/01/2017   Vitamin D deficiency 02/01/2017   HTN (hypertension) 02/17/2014   Hypercholesterolemia 02/17/2014    REFERRING DIAG:  G62.9 (ICD-10-CM) - Polyneuropathy, unspecified  R26.89 (ICD-10-CM) - Other abnormalities of gait and mobility    THERAPY DIAG:  Imbalance  Muscle weakness (generalized)  Difficulty in walking, not elsewhere classified  Rationale for Evaluation and Treatment Rehabilitation  PERTINENT HISTORY: Patient is an 82 year old female referred for balance and gait deficits with Hx of longstanding bilateral peripheral neuropathy, hx of L TKA (12/10/2020) and R knee pain secondary to OA. Patient reports SOB following panic attack during her L TKA rehab. Pt uses CPAP at night now for sleep apnea. She reports small heart blockage after that episode. She reports having increased difficulty walking during this workup with cardiology. Pt reports increased fluid accumulation from 230 lbs with her weight now being 179 lbs. Patient reports difficulty with walking at this time - she uses walker when going outside usually, especially on uneven ground. Pt finds herself walking without AD in home sometimes. Hx of vascular disease and peripheral neuropathy. Pt is notably challenged with fear of falling. Hx of dyspnea on exertion    Pain: Yes, R knee; doing better following cortisone injection  Numbness/Tingling: Yes, sensory change in hands  and feet from neuropathy Focal Weakness: general LE weakness Recent changes in overall health/medication: Yes, heart blockage found  Prior history of physical therapy for balance:  No Falls: Has patient fallen in last 6 months? Yes, Number of falls: 4, pt fell when riding lawn mower when ducking under limb (pt fell backward off of lawnmower). Pt fell in home 2x when bending over to grab item from floor, pt fell forward when getting out  of bed.  Directional pattern for falls: Yes, forward falling when bending over  Imaging: Yes    Radiograph L knee 12/10/20 Status post total knee replacement with prosthetic components well-seated. No fra cture or dislocation. Acute postoperative changes noted.       Prior level of function: Independent Occupational demands: Retired  Presenter, broadcasting: Industrial/product designer (jewelry, painting)    Red flags (bowel/bladder changes, saddle paresthesia, personal history of cancer, h/o spinal tumors, h/o compression fx, h/o abdominal aneurysm, abdominal pain, chills/fever, night sweats, nausea, vomiting, unrelenting pain): Positive for Hx of breast cancer (in remission), otherwise no other red flags    Weight Bearing Restrictions: No   Living Environment Lives with: lives alone, daughter lives close by  Lives in: House/apartment Has following equipment at home: Single point cane, Environmental consultant - 4 wheeled, and Grab bars Pt uses step-to pattern to get up 14 steps in home 3 steps to get into home (gets up those easily)  Step-in shower, no shower bench; pt has suction grab bar     Patient Goals: Able to walk better, improve balance    PRECAUTIONS: Fall history                                                                                                                                                                   SUBJECTIVE STATEMENT: In regard to recent hypertensive emergency, patient reports going to urgent care and then getting normal vital sign readings. She reports some unsteadiness when initially getting out of bed and getting out of bed at night. Patient reports having 2 near-fall incidents but being able to catch herself.    PAIN:  Are you having pain? No   OBJECTIVE: (objective measures completed at initial evaluation unless otherwise dated)   Patient Surveys  FOTO: 37, predicted improvement to 34 ABC: 62.5%     Cognition Patient is oriented to person, place, and time.  Recent memory is  intact.  Remote memory is intact.  Attention span and concentration are intact.  Expressive speech is intact.  Patient's fund of knowledge is within normal limits for educational level.                            Observation Venous distension and varicose veins in bilat legs,  color change. No inc tissue temperature, calf girth, pitting edema, or significant calf tenderness.     GAIT: Distance walked: 45 ft Assistive device utilized: Single point cane, RUE Level of assistance: SBA Comments: Pt walks with RLE externally rotated, wide BOS, decreased gait cadence, diminished heel strike, slow velocity    Posture: Pt rests in posterior pelvic tilt. Grossly kyphotic posture. Pes planus. Genu valgus R>L knee.      LE MMT:   MMT (out of 5) Right 10/06/2022 Left 10/06/2022  Hip flexion 4 4  Hip extension      Hip abduction 5 5  Hip adduction 5 5  Hip internal rotation      Hip external rotation      Knee flexion 5 5  Knee extension 5 5  Ankle dorsiflexion 4 5  Ankle plantarflexion      Ankle inversion      Ankle eversion      (* = pain; Blank rows = not tested)     Sensation Decreased light touch sensation in bilateral toes and dorsum of feet     Reflexes Deferred     Cranial Nerves Visual acuity and visual fields are intact  Extraocular muscles are intact; pt does have difficulty tracking vertically from top of visual field back to neutral position  Facial sensation is intact bilaterally  Facial strength is intact bilaterally  Hearing is normal as tested by gross conversation Palate elevates midline, normal phonation  Shoulder shrug strength is intact  Tongue protrudes midline     Coordination/Cerebellar Finger to Nose: WNL Heel to Shin: WNL Rapid alternating movements: WNL Finger Opposition: WNL Pronator Drift: Negative     FUNCTIONAL OUTCOME MEASURES     Results Comments  BERG 10/08/22: 48/56 Fall risk, in need of intervention  TUG 10/06/22: 19.5  seconds     5TSTS 10/06/22: 17 seconds    DGI 10/08/22: 11/24    10 Meter Gait Speed 10/08/22: Self-selected: s = 0.59 m/s; Fastest: s = 0.69 m/s Below normative values for full community ambulation  (Blank rows = not tested)          TODAY'S TREATMENT   Today's Vitals   02/23/23 1443  BP: 106/70  Pulse: (!) 59  SpO2: 96%   There is no height or weight on file to calculate BMI.   Neuromuscular Re-education - for improved sensory integration, static and dynamic postural control, equilibrium and non-equilibrium coordination as needed for negotiating home and community environment and stepping over obstacles  In parallel bars:  Toe tapping on 6-inch step + Airex; 2x10 alternating R/L  Toe tapping on cone, single cone in front of patient; 2x10 alternating Standing march on Airex pad; x20 alternating R/L  Multi-directional stepping (8 directions, anterior/anterolateral/lateral/posterolateral/posterior) on blue star marker; x4 each direction  Forward stepping over obstacles with a step-to pattern, (3) 6-inch hurdles, Airex pad, 6-inch step; 2x D/B 1/2 length of hallway    *not today* Standing in semi-tandem with postural perturbations, multi-directional; 2 x 1 minute (RLE in back and LLE in back) Standing in semi-tandem on Airex pad; 4x45 sec each position (RLE in back and LLE in back) Consecutive hurdle stepping with three 6-inch hurdles; x8 D/B length of // bars Adjacent to low mat:  Cone reaching, pt reaching outside of BOS to challenge static postural control; 2x10 cones for increased forward and cross-body reach  4 Step Staircase:  Stair negotiation facing forward with bilateral UE support; x5 .  Sit to stand with Airex  under feet; additional Airex on chair to increase chair height; performed 3x10 Tandem standing on floor; 2x30 sec  Toe tapping, with bilat standing; alternating R/L 2x10 on 6-inch step with Airex pad on top - for fleeting unipedal stance as needed for  obstacle negotiation  Standing single hurdle step; 2x10 with each LE, over dumbbell on floor for visual target   -no UE support Tagging sticky notes on wall with ball; multi-directional reaching requiring bending/leaning; x 4 minutes - for forward reaching and having trunk stability while doing so. Balloon tapping for dynamic postural control and external perturbation; x 3 minutes  Airex pad standing march; 2x10 alternating Step up/down 3 consecutive Airex pads; 2x D/B length of parallel bars  -verbal cueing to limit pushing posterior thigh on edge of chair Functional reaching with cones; 10 cones on adjacent table; 2 trials of forward reaching and cross-body reach to place cone down with each upper limb   -for postural control with forward leaning and reaching tasks    Therapeutic Exercise - improved strength as needed to improve performance of CKC activities/functional movements and as needed for power production to prevent fall during episode of large postural perturbation, completion of performance-based outcome measures to detect fall risk and track progress   NuStep; x 5 minutes with Level 4 resistance, seat at 8 - for LE strengthening and lower limb mobility   -subjective information gathered during this time, 1 minute unbilled  Forward/backward stepping; 5x D/B blue agility ladder ;  - verbal cueing to limit toe-out and heel strike High knees, 4x D/B length of // bars   Forward step up to Airex pad; 2x10 on each LE  -pain with step up c RLE secondary to knee pain/OA    *not today* Squat to butt tap on edge of chair, with Airex on chair for increased seat height; 2x10 -verbal cueing and demo to reduce weightbearing on ischial tuberosities at bottom of squat Sidestepping with Blue Tband just superior to bilat patellae; 2x4 D/B length // bars Standing heel raise/toe raise; x10 alternating Standing march with light touch in single bar, in // bars; x10 alternating Sit to stand,  reviewed      PATIENT EDUCATION:  Education details: see above for patient education details Person educated: Patient Education method: Explanation, Demonstration, and Handouts Education comprehension: verbalized understanding     HOME EXERCISE PROGRAM: Access Code: [email or web address] URL: [email or web address] Date: 10/12/2022 Prepared by: Consuela Mimes  Exercises - Sit to Stand Without Arm Support  - 2 x daily - 7 x weekly - 2 sets - 10 reps - Heel Toe Raises with Unilateral Counter Support  - 2 x daily - 7 x weekly - 2 sets - 10 reps - Standing March with Counter Support  - 2 x daily - 7 x weekly - 2 sets - 10 reps      ASSESSMENT:   CLINICAL IMPRESSION: Obtaining vital signs is challenging today due to issue with equipment. Manual auscultation is performed with BP in slightly hypotensive range and normal SpO2. Pulse is just under 60-80 bpm normal range. Pt fortunately has been able to obtain normal vital sign readings since concerning high BP was measured last week. Patient has not had recent falls and has been able to recover her balance with recent episodes of LOB at home. Pt participates very well with physical therapy and has made notable progress to date. Patient has remaining deficits in impaired postural control, gait changes, difficulty with obstacle negotiation, hip and trunk control  needed to prevent forward LOB with bending over/reaching to low-lying item. Patient will benefit from skilled PT to address above impairments and improve overall function.    REHAB POTENTIAL: Good   CLINICAL DECISION MAKING: Evolving/moderate complexity   EVALUATION COMPLEXITY: Moderate     GOALS:   SHORT TERM GOALS: Target date: 10/30/2022   Pt will be independent with HEP in order to improve strength and balance in order to decrease fall risk and improve function at home. Baseline: 11/17/22: Pt is compliant with HEP Goal status: ACHIEVED     LONG TERM GOALS: Target date:  12/04/2022   Pt will increase FOTO to at least 57 to demonstrate significant improvement in function at home related to balance  Baseline: 10/06/22: 54.   11/17/22: 48.   01/01/23: 53 Goal status: NOT MET    2.  Pt will improve BERG by at least 3 points in order to demonstrate clinically significant improvement in balance.   Baseline: 10/08/22: 48/56    11/17/22: 48/56    01/01/23: 50/56 Goal status: PARTIAL MET   3.  Pt will improve ABC by at least 13% in order to demonstrate clinically significant improvement in balance confidence.      Baseline:  10/08/22: 62.5%.   11/17/22: 63.1%    01/01/23: 65.6% Goal status: NOT MET    4. Pt will decrease 5TSTS by at least 3 seconds in order to demonstrate clinically significant improvement in LE strength      Baseline: 10/06/22: 17 sec   11/17/22: 14.4 sec     01/01/23: 12.47 sec Goal status: ACHIEVED   5. Pt will improve DGI by at least 3 points in order to demonstrate clinically significant improvement in balance and decreased risk for falls.     Baseline: 10/08/22: 11/24.   11/17/22: 14/24    01/01/23: 17/24 Goal status: ACHIEVED   6. Pt will decrease TUG to below 14 seconds/decrease in order to demonstrate decreased fall risk.  Baseline: 10/06/22: 19.5 sec.   11/17/22: 16.3 sec    01/01/23: 14.4 sec Goal status: PARTIAL MET         PLAN: PT FREQUENCY: 2x/week   PT DURATION: 4 weeks   PLANNED INTERVENTIONS: Therapeutic exercises, Therapeutic activity, Neuromuscular re-education, Balance training, Gait training, Patient/Family education,    PLAN FOR NEXT SESSION: Continue with task practice for stepping with sound heel strike technique, postural control training, obstacle negotiation, multi-directional stepping; strategies to improve ability to reach low to floor including squatting and functional reaching. Continue with gait stability training. Introduce more stepping down motions negotiation of stairs and curbs.   Consuela Mimes, PT, DPT  [phone] 02/23/2023 2:45 PM

## 2023-02-25 ENCOUNTER — Ambulatory Visit: Payer: PPO | Admitting: Physical Therapy

## 2023-03-02 DIAGNOSIS — G4733 Obstructive sleep apnea (adult) (pediatric): Secondary | ICD-10-CM | POA: Diagnosis not present

## 2023-03-10 ENCOUNTER — Ambulatory Visit: Payer: PPO | Attending: Nurse Practitioner

## 2023-03-10 DIAGNOSIS — M6281 Muscle weakness (generalized): Secondary | ICD-10-CM | POA: Diagnosis not present

## 2023-03-10 DIAGNOSIS — R2689 Other abnormalities of gait and mobility: Secondary | ICD-10-CM

## 2023-03-10 DIAGNOSIS — R262 Difficulty in walking, not elsewhere classified: Secondary | ICD-10-CM | POA: Diagnosis not present

## 2023-03-10 NOTE — Therapy (Signed)
OUTPATIENT PHYSICAL THERAPY TREATMENT/Reassessment and DC summary     Patient Name: Laurie Roy MRN: [id number] DOB:Jul 29, 1941, 82 y.o., female Today's Date: 03/10/2023   PCP: Myrene Buddy, NP REFERRING PROVIDER: Myrene Buddy, NP  END OF SESSION:   PT End of Session - 03/10/23 1649     Visit Number 29    Number of Visits 30    Date for PT Re-Evaluation 02/05/23    Authorization Type HTA; VL based on [place]    Authorization Time Period 12/15/22-02/16/23    Progress Note Due on Visit 30    PT Start Time 1645    PT Stop Time 1715    PT Time Calculation (min) 30 min    Activity Tolerance Patient tolerated treatment well    Behavior During Therapy [facility] for tasks assessed/performed               Past Medical History:  Diagnosis Date   Breast cancer (HCC) 2002   Complication of anesthesia    Heart murmur    Personal history of chemotherapy    Personal history of radiation therapy    PONV (postoperative nausea and vomiting)    Past Surgical History:  Procedure Laterality Date   APPLICATION OF WOUND VAC Left 12/10/2020   Procedure: APPLICATION OF PREVENA  WOUND VAC;  Surgeon: Donato Heinz, MD;  Location: [facility] ORS;  Service: Orthopedics;  Laterality: Left;   COLONOSCOPY     KNEE ARTHROPLASTY Left 12/10/2020   Procedure: COMPUTER ASSISTED TOTAL KNEE ARTHROPLASTY;  Surgeon: Donato Heinz, MD;  Location: [facility] ORS;  Service: Orthopedics;  Laterality: Left;   LEFT HEART CATH AND CORONARY ANGIOGRAPHY Left 04/16/2021   Procedure: LEFT HEART CATH AND CORONARY ANGIOGRAPHY;  Surgeon: Lamar Blinks, MD;  Location: [facility] INVASIVE CV LAB;  Service: Cardiovascular;  Laterality: Left;   MASTECTOMY Left 2002   Patient Active Problem List   Diagnosis Date Noted   Angina pectoris (HCC) 04/16/2021   IGT (impaired glucose tolerance) 12/10/2020   Neuropathy 12/10/2020   Obesity (BMI 30-39.9) 12/10/2020   Primary osteoarthritis of left knee 09/16/2020   Primary  osteoarthritis of right knee 09/16/2020   Polyarthralgia 03/26/2017   Vitamin B12 deficiency 02/01/2017   Vitamin D deficiency 02/01/2017   HTN (hypertension) 02/17/2014   Hypercholesterolemia 02/17/2014    REFERRING DIAG:  G62.9 (ICD-10-CM) - Polyneuropathy, unspecified  R26.89 (ICD-10-CM) - Other abnormalities of gait and mobility    THERAPY DIAG:  Imbalance  Muscle weakness (generalized)  Difficulty in walking, not elsewhere classified  Rationale for Evaluation and Treatment Rehabilitation  PERTINENT HISTORY: Patient is an 82 year old female referred for balance and gait deficits with Hx of longstanding bilateral peripheral neuropathy, hx of L TKA (12/10/2020) and R knee pain secondary to OA. Patient reports SOB following panic attack during her L TKA rehab. Pt uses CPAP at night now for sleep apnea. She reports small heart blockage after that episode. She reports having increased difficulty walking during this workup with cardiology. Pt reports increased fluid accumulation from 230 lbs with her weight now being 179 lbs. Patient reports difficulty with walking at this time - she uses walker when going outside usually, especially on uneven ground. Pt finds herself walking without AD in home sometimes. Hx of vascular disease and peripheral neuropathy. Pt is notably challenged with fear of falling. Hx of dyspnea on exertion     Red flags (bowel/bladder changes, saddle paresthesia, personal history of cancer, h/o spinal tumors, h/o compression fx, h/o abdominal  aneurysm, abdominal pain, chills/fever, night sweats, nausea, vomiting, unrelenting pain): Positive for Hx of breast cancer (in remission), otherwise no other red flags    Weight Bearing Restrictions: No     Patient Goals: Able to walk better, improve balance    PRECAUTIONS: Fall history                                                                                                                                                                    SUBJECTIVE STATEMENT: Pt doing generally well, was quite busy with DTR moving in. She feels generally weak today without explanation, but within typical fluctuations. Pt reports ready for DC, feels to have hit a plateau.   PAIN:  Are you having pain? No   OBJECTIVE: (objective measures completed at initial evaluation unless otherwise dated)    TODAY'S TREATMENT  Reassessment and use of goals, FOTO. HEP update.     PATIENT EDUCATION:  Education details: see above for patient education details Person educated: Patient Education method: Explanation, Demonstration, and Handouts Education comprehension: verbalized understanding     HOME EXERCISE PROGRAM: Access Code: [email or web address] URL: [email or web address] Date: 03/10/2023 Prepared by: Alvera Novel  Exercises - Sit to Stand Without Arm Support  - 2 x daily - 7 x weekly - 2 sets - 10 reps - Heel Toe Raises with Unilateral Counter Support  - 2 x daily - 7 x weekly - 2 sets - 10 reps - Standing March with Counter Support  - 2 x daily - 7 x weekly - 2 sets - 10 reps -side stepping at counter -walking around object 180 degrees bilat       ASSESSMENT:   CLINICAL IMPRESSION: Pt returns to final visit- reassessment performed of several outcome measures. Many LT goals of treatment not met despite fluctuating progress over past 4 months. Pt reports happier with mobility, now using lesser assistive device daily, but generally her function fluctuates from day to day. Pt agreeable to DC at this time. HEP updated. Pt to follow up with orthopedics regarding ongoing DJD knee limitations.     REHAB POTENTIAL: Good   CLINICAL DECISION MAKING: Evolving/moderate complexity   EVALUATION COMPLEXITY: Moderate     GOALS:   SHORT TERM GOALS: Target date: 10/30/2022   Pt will be independent with HEP in order to improve strength and balance in order to decrease fall risk and improve function at home. Baseline:  11/17/22: Pt is compliant with HEP Goal status: ACHIEVED     LONG TERM GOALS: Target date: 02/16/2023   Pt will increase FOTO to at least 57 to demonstrate significant improvement in function at home related to balance  Baseline: 10/06/22: 54.   11/17/22: 48.   01/01/23: 53; 03/10/23: 54 Goal status: NOT MET  2.  Pt will improve BERG by at least 3 points in order to demonstrate clinically significant improvement in balance.   Baseline: 10/08/22: 48/56    11/17/22: 48/56    01/01/23: 50/56; 03/10/23: Not retested  Goal status: PARTIAL MET   3.  Pt will improve ABC by at least 13% in order to demonstrate clinically significant improvement in balance confidence.      Baseline:  10/08/22: 62.5%.   11/17/22: 63.1%    01/01/23: 65.6%; 03/10/23: did not retest  Goal status: NOT MET    4. Pt will decrease 5TSTS by at least 3 seconds in order to demonstrate clinically significant improvement in LE strength      Baseline: 10/06/22: 17 sec   11/17/22: 14.4 sec     01/01/23: 12.47 sec; 16.45sec hands free Goal status: NOT MET   5. Pt will improve DGI by at least 3 points in order to demonstrate clinically significant improvement in balance and decreased risk for falls.     Baseline: 10/08/22: 11/24.   11/17/22: 14/24    01/01/23: 17/24; 03/10/23: not retested  Goal status: ACHIEVED   6. Pt will decrease TUG to below 14 seconds/decrease in order to demonstrate decreased fall risk.  Baseline: 10/06/22: 19.5 sec.   11/17/22: 16.3 sec    01/01/23: 14.4 sec; 03/10/23: 20.80sec c SPC  Goal status: NOT MET         PLAN: PT FREQUENCY: 2x/week   PT DURATION: 4 weeks   PLANNED INTERVENTIONS: Therapeutic exercises, Therapeutic activity, Neuromuscular re-education, Balance training, Gait training, Patient/Family education,    PLAN FOR NEXT SESSION: Continue with task practice for stepping with sound heel strike technique, postural control training, obstacle negotiation, multi-directional stepping; strategies to improve ability  to reach low to floor including squatting and functional reaching. Continue with gait stability training. Introduce more stepping down motions negotiation of stairs and curbs.   5:19 PM, 03/10/23 Rosamaria Lints, PT, DPT Physical Therapist - [facility]  [phone] (ASCOM)    03/10/2023 5:19 PM

## 2023-03-10 NOTE — Addendum Note (Signed)
Addended by: Gertie Exon on: 03/10/2023 09:23 PM   Modules accepted: Orders

## 2023-03-24 DIAGNOSIS — Z96652 Presence of left artificial knee joint: Secondary | ICD-10-CM | POA: Diagnosis not present

## 2023-03-24 DIAGNOSIS — M1711 Unilateral primary osteoarthritis, right knee: Secondary | ICD-10-CM | POA: Diagnosis not present

## 2023-04-01 DIAGNOSIS — G4733 Obstructive sleep apnea (adult) (pediatric): Secondary | ICD-10-CM | POA: Diagnosis not present

## 2023-05-11 DIAGNOSIS — R0609 Other forms of dyspnea: Secondary | ICD-10-CM | POA: Diagnosis not present

## 2023-05-11 DIAGNOSIS — G4733 Obstructive sleep apnea (adult) (pediatric): Secondary | ICD-10-CM | POA: Diagnosis not present

## 2023-05-28 DIAGNOSIS — M19011 Primary osteoarthritis, right shoulder: Secondary | ICD-10-CM | POA: Diagnosis not present

## 2023-05-28 DIAGNOSIS — G8929 Other chronic pain: Secondary | ICD-10-CM | POA: Diagnosis not present

## 2023-07-22 DIAGNOSIS — G4733 Obstructive sleep apnea (adult) (pediatric): Secondary | ICD-10-CM | POA: Diagnosis not present

## 2023-08-19 DIAGNOSIS — G629 Polyneuropathy, unspecified: Secondary | ICD-10-CM | POA: Diagnosis not present

## 2023-08-19 DIAGNOSIS — E038 Other specified hypothyroidism: Secondary | ICD-10-CM | POA: Diagnosis not present

## 2023-08-19 DIAGNOSIS — Z0000 Encounter for general adult medical examination without abnormal findings: Secondary | ICD-10-CM | POA: Diagnosis not present

## 2023-08-19 DIAGNOSIS — Z79899 Other long term (current) drug therapy: Secondary | ICD-10-CM | POA: Diagnosis not present

## 2023-08-19 DIAGNOSIS — M1711 Unilateral primary osteoarthritis, right knee: Secondary | ICD-10-CM | POA: Diagnosis not present

## 2023-08-19 DIAGNOSIS — N1831 Chronic kidney disease, stage 3a: Secondary | ICD-10-CM | POA: Diagnosis not present

## 2023-08-19 DIAGNOSIS — I2510 Atherosclerotic heart disease of native coronary artery without angina pectoris: Secondary | ICD-10-CM | POA: Diagnosis not present

## 2023-08-19 DIAGNOSIS — E7800 Pure hypercholesterolemia, unspecified: Secondary | ICD-10-CM | POA: Diagnosis not present

## 2023-08-19 DIAGNOSIS — I10 Essential (primary) hypertension: Secondary | ICD-10-CM | POA: Diagnosis not present

## 2023-08-19 DIAGNOSIS — I5022 Chronic systolic (congestive) heart failure: Secondary | ICD-10-CM | POA: Diagnosis not present

## 2023-08-19 DIAGNOSIS — Z853 Personal history of malignant neoplasm of breast: Secondary | ICD-10-CM | POA: Diagnosis not present

## 2023-08-19 DIAGNOSIS — G4733 Obstructive sleep apnea (adult) (pediatric): Secondary | ICD-10-CM | POA: Diagnosis not present

## 2023-08-19 DIAGNOSIS — E559 Vitamin D deficiency, unspecified: Secondary | ICD-10-CM | POA: Diagnosis not present

## 2023-08-19 DIAGNOSIS — E538 Deficiency of other specified B group vitamins: Secondary | ICD-10-CM | POA: Diagnosis not present

## 2023-08-19 DIAGNOSIS — R7302 Impaired glucose tolerance (oral): Secondary | ICD-10-CM | POA: Diagnosis not present

## 2023-08-19 DIAGNOSIS — Z23 Encounter for immunization: Secondary | ICD-10-CM | POA: Diagnosis not present

## 2023-08-25 DIAGNOSIS — M1711 Unilateral primary osteoarthritis, right knee: Secondary | ICD-10-CM | POA: Diagnosis not present

## 2023-08-25 DIAGNOSIS — M25511 Pain in right shoulder: Secondary | ICD-10-CM | POA: Diagnosis not present

## 2023-08-25 DIAGNOSIS — G8929 Other chronic pain: Secondary | ICD-10-CM | POA: Diagnosis not present

## 2023-09-13 NOTE — Discharge Instructions (Signed)

## 2023-09-15 ENCOUNTER — Other Ambulatory Visit: Payer: Self-pay

## 2023-09-15 ENCOUNTER — Encounter
Admission: RE | Admit: 2023-09-15 | Discharge: 2023-09-15 | Disposition: A | Discharge status: Home / Self Care | Payer: PPO | Source: Ambulatory Visit | Attending: Orthopedic Surgery | Admitting: Orthopedic Surgery

## 2023-09-15 HISTORY — DX: Dyspnea, unspecified: R06.00

## 2023-09-15 HISTORY — DX: Pain in unspecified joint: M25.50

## 2023-09-15 HISTORY — DX: Essential (primary) hypertension: I10

## 2023-09-15 HISTORY — DX: Angina pectoris, unspecified: I20.9

## 2023-09-15 HISTORY — DX: Unspecified osteoarthritis, unspecified site: M19.90

## 2023-09-15 HISTORY — DX: Chronic kidney disease, unspecified: N18.9

## 2023-09-15 HISTORY — DX: Impaired glucose tolerance (oral): R73.02

## 2023-09-15 HISTORY — DX: Pure hypercholesterolemia, unspecified: E78.00

## 2023-09-15 HISTORY — DX: Sleep apnea, unspecified: G47.30

## 2023-09-15 HISTORY — DX: Atherosclerotic heart disease of native coronary artery without angina pectoris: I25.10

## 2023-09-15 HISTORY — DX: Polyneuropathy, unspecified: G62.9

## 2023-09-15 NOTE — Patient Instructions (Addendum)
Your procedure is scheduled on: Monday 09/21/23 To find out your arrival time, please call [phone] between 1PM - 3PM on:  Friday 09/18/23  Report to the Registration Desk on the 1st floor of [facility]. Free Valet parking is available.  If your arrival time is 6:00 am, do not arrive before that time as the [facility] entrance doors do not open until 6:00 am.  REMEMBER: Instructions that are not followed completely may result in serious medical risk, up to and including death; or upon the discretion of your surgeon and anesthesiologist your surgery may need to be rescheduled.  Do not eat food after midnight the night before surgery.  No gum chewing or hard candies.  You may however, drink CLEAR liquids up to 2 hours before you are scheduled to arrive for your surgery. Do not drink anything within 2 hours of your scheduled arrival time.  Clear liquids include: - water  - apple juice without pulp - gatorade (not RED colors) - black coffee or tea (Do NOT add milk or creamers to the coffee or tea) Do NOT drink anything that is not on this list.  Type 1 and Type 2 diabetics should only drink water.  In addition, your doctor has ordered for you to drink the provided:  Ensure Pre-Surgery Clear Carbohydrate Drink  Drinking this carbohydrate drink up to two hours before surgery helps to reduce insulin resistance and improve patient outcomes. Please complete drinking 2 hours before scheduled arrival time.  One week prior to surgery: Stop Anti-inflammatories (NSAIDS) such as Advil, Aleve, Ibuprofen, Motrin, Naproxen, Naprosyn and Aspirin based products such as Excedrin, Goody's Powder, BC Powder. You may however, continue to take Tylenol if needed for pain up until the day of surgery.  Stop ANY OVER THE COUNTER supplements and vitamins today 09/15/23 until after surgery.  Continue taking all prescribed medications with the exception of the following: Hold for surgery Jardiance,  last dose Thursday 09/17/23  TAKE ONLY THESE MEDICATIONS THE MORNING OF SURGERY WITH A SIP OF WATER:  carvedilol (COREG) 12.5 MG tablet  rosuvastatin (CRESTOR) 5 MG tablet  venlafaxine XR (EFFEXOR-XR) 75 MG 24 hr capsule   No Alcohol for 24 hours before or after surgery.  No Smoking including e-cigarettes for 24 hours before surgery.  No chewable tobacco products for at least 6 hours before surgery.  No nicotine patches on the day of surgery.  Do not use any "recreational" drugs for at least a week (preferably 2 weeks) before your surgery.  Please be advised that the combination of cocaine and anesthesia may have negative outcomes, up to and including death. If you test positive for cocaine, your surgery will be cancelled.  On the morning of surgery brush your teeth with toothpaste and water, you may rinse your mouth with mouthwash if you wish. Do not swallow any toothpaste or mouthwash.  Use CHG Soap or wipes as directed on instruction sheet. Shower daily with CHG soap starting Thursday 09/17/23  Do not wear lotions, powders, or perfumes the day of surgery.   Do not shave body hair from the neck down 48 hours before surgery.  Wear clean comfortable clothing (specific to your surgery type) to the hospital.  Do not wear jewelry, make-up, hairpins, clips or nail polish.  For welded (permanent) jewelry: bracelets, anklets, waist bands, etc.  Please have this removed prior to surgery.  If it is not removed, there is a chance that hospital personnel will need to cut it off  on the day of surgery. Contact lenses, hearing aids and dentures may not be worn into surgery.  Bring your C-PAP to the hospital in case you may have to spend the night.   Do not bring valuables to the hospital. [facility] is not responsible for any missing/lost belongings or valuables.   Notify your doctor if there is any change in your medical condition (cold, fever, infection).  If you are being discharged  the day of surgery, you will not be allowed to drive home. You will need a responsible individual to drive you home and stay with you for 24 hours after surgery.   If you are taking public transportation, you will need to have a responsible individual with you.  If you are being admitted to the hospital overnight, leave your suitcase in the car. After surgery it may be brought to your room.  In case of increased patient census, it may be necessary for you, the patient, to continue your postoperative care in the Same Day Surgery department.  After surgery, you can help prevent lung complications by doing breathing exercises.  Take deep breaths and cough every 1-2 hours. Your doctor may order a device called an Incentive Spirometer to help you take deep breaths. When coughing or sneezing, hold a pillow firmly against your incision with both hands. This is called "splinting." Doing this helps protect your incision. It also decreases belly discomfort.  Surgery Visitation Policy:  Patients undergoing a surgery or procedure may have two family members or support persons with them as long as the person is not COVID-19 positive or experiencing its symptoms.   Inpatient Visitation:    Visiting hours are 7 a.m. to 8 p.m. Up to four visitors are allowed at one time in a patient room. The visitors may rotate out with other people during the day. One designated support person (adult) may remain overnight.  Please call the Pre-admissions Testing Dept. at [phone] if you have any questions about these instructions.    Pre-operative 5 CHG Bath Instructions   You can play a key role in reducing the risk of infection after surgery. Your skin needs to be as free of germs as possible. You can reduce the number of germs on your skin by washing with CHG (chlorhexidine gluconate) soap before surgery. CHG is an antiseptic soap that kills germs and continues to kill germs even after washing.   DO NOT use  if you have an allergy to chlorhexidine/CHG or antibacterial soaps. If your skin becomes reddened or irritated, stop using the CHG and notify one of our RNs at [phone].   Please shower with the CHG soap starting 4 days before surgery using the following schedule:     Please keep in mind the following:  DO NOT shave, including legs and underarms, starting the day of your first shower.   You may shave your face at any point before/day of surgery.  Place clean sheets on your bed the day you start using CHG soap. Use a clean washcloth (not used since being washed) for each shower. DO NOT sleep with pets once you start using the CHG.   CHG Shower Instructions:  If you choose to wash your hair and private area, wash first with your normal shampoo/soap.  After you use shampoo/soap, rinse your hair and body thoroughly to remove shampoo/soap residue.  Turn the water OFF and apply about 3 tablespoons (45 ml) of CHG soap to a CLEAN washcloth.  Apply CHG soap  ONLY FROM YOUR NECK DOWN TO YOUR TOES (washing for 3-5 minutes)  DO NOT use CHG soap on face, private areas, open wounds, or sores.  Pay special attention to the area where your surgery is being performed.  If you are having back surgery, having someone wash your back for you may be helpful. Wait 2 minutes after CHG soap is applied, then you may rinse off the CHG soap.  Pat dry with a clean towel  Put on clean clothes/pajamas   If you choose to wear lotion, please use ONLY the CHG-compatible lotions on the back of this paper.     Additional instructions for the day of surgery: DO NOT APPLY any lotions, deodorants, cologne, or perfumes.   Put on clean/comfortable clothes.  Brush your teeth.  Ask your nurse before applying any prescription medications to the skin.      CHG Compatible Lotions   Aveeno Moisturizing lotion  Cetaphil Moisturizing Cream  Cetaphil Moisturizing Lotion  Clairol Herbal Essence Moisturizing Lotion, Dry  Skin  Clairol Herbal Essence Moisturizing Lotion, Extra Dry Skin  Clairol Herbal Essence Moisturizing Lotion, Normal Skin  Curel Age Defying Therapeutic Moisturizing Lotion with Alpha Hydroxy  Curel Extreme Care Body Lotion  Curel Soothing Hands Moisturizing Hand Lotion  Curel Therapeutic Moisturizing Cream, Fragrance-Free  Curel Therapeutic Moisturizing Lotion, Fragrance-Free  Curel Therapeutic Moisturizing Lotion, Original Formula  Eucerin Daily Replenishing Lotion  Eucerin Dry Skin Therapy Plus Alpha Hydroxy Crme  Eucerin Dry Skin Therapy Plus Alpha Hydroxy Lotion  Eucerin Original Crme  Eucerin Original Lotion  Eucerin Plus Crme Eucerin Plus Lotion  Eucerin TriLipid Replenishing Lotion  Keri Anti-Bacterial Hand Lotion  Keri Deep Conditioning Original Lotion Dry Skin Formula Softly Scented  Keri Deep Conditioning Original Lotion, Fragrance Free Sensitive Skin Formula  Keri Lotion Fast Absorbing Fragrance Free Sensitive Skin Formula  Keri Lotion Fast Absorbing Softly Scented Dry Skin Formula  Keri Original Lotion  Keri Skin Renewal Lotion Keri Silky Smooth Lotion  Keri Silky Smooth Sensitive Skin Lotion  Nivea Body Creamy Conditioning Oil  Nivea Body Extra Enriched Teacher, adult education Moisturizing Lotion Nivea Crme  Nivea Skin Firming Lotion  NutraDerm 30 Skin Lotion  NutraDerm Skin Lotion  NutraDerm Therapeutic Skin Cream  NutraDerm Therapeutic Skin Lotion  ProShield Protective Hand Cream  Provon moisturizing lotion

## 2023-09-16 ENCOUNTER — Encounter: Payer: Self-pay | Admitting: Urgent Care

## 2023-09-16 ENCOUNTER — Encounter
Admission: RE | Admit: 2023-09-16 | Discharge: 2023-09-16 | Disposition: A | Discharge status: Home / Self Care | Payer: PPO | Source: Ambulatory Visit | Attending: Orthopedic Surgery | Admitting: Orthopedic Surgery

## 2023-09-16 DIAGNOSIS — M1711 Unilateral primary osteoarthritis, right knee: Secondary | ICD-10-CM | POA: Diagnosis not present

## 2023-09-16 DIAGNOSIS — Z01818 Encounter for other preprocedural examination: Secondary | ICD-10-CM

## 2023-09-16 DIAGNOSIS — I5022 Chronic systolic (congestive) heart failure: Secondary | ICD-10-CM | POA: Diagnosis not present

## 2023-09-16 DIAGNOSIS — Z01812 Encounter for preprocedural laboratory examination: Secondary | ICD-10-CM | POA: Diagnosis present

## 2023-09-16 DIAGNOSIS — I2510 Atherosclerotic heart disease of native coronary artery without angina pectoris: Secondary | ICD-10-CM | POA: Diagnosis not present

## 2023-09-16 DIAGNOSIS — E7800 Pure hypercholesterolemia, unspecified: Secondary | ICD-10-CM | POA: Diagnosis not present

## 2023-09-16 DIAGNOSIS — E538 Deficiency of other specified B group vitamins: Secondary | ICD-10-CM | POA: Diagnosis not present

## 2023-09-16 DIAGNOSIS — I10 Essential (primary) hypertension: Secondary | ICD-10-CM | POA: Diagnosis not present

## 2023-09-16 LAB — URINALYSIS, ROUTINE W REFLEX MICROSCOPIC
Bacteria, UA: NONE SEEN
Bilirubin Urine: NEGATIVE
Glucose, UA: >=500 mg/dL — AB
Ketones, ur: NEGATIVE mg/dL
Leukocytes,Ua: NEGATIVE
Nitrite: NEGATIVE
Protein, ur: NEGATIVE mg/dL
Specific Gravity, Urine: 1.024 (ref 1.005–1.030)
pH: 5 (ref 5.0–8.0)

## 2023-09-16 LAB — COMPREHENSIVE METABOLIC PANEL
ALT: 19 U/L (ref 0–44)
AST: 15 U/L (ref 15–41)
Albumin: 3.7 g/dL (ref 3.5–5.0)
Alkaline Phosphatase: 76 U/L (ref 38–126)
Anion gap: 9 (ref 5–15)
BUN: 32 mg/dL — ABNORMAL HIGH (ref 8–23)
CO2: 27 mmol/L (ref 22–32)
Calcium: 8.8 mg/dL — ABNORMAL LOW (ref 8.9–10.3)
Chloride: 101 mmol/L (ref 98–111)
Creatinine, Ser: 1.15 mg/dL — ABNORMAL HIGH (ref 0.44–1.00)
GFR, Estimated: 47 mL/min — ABNORMAL LOW (ref >60)
Glucose, Bld: 81 mg/dL (ref 70–99)
Potassium: 4.4 mmol/L (ref 3.5–5.1)
Sodium: 137 mmol/L (ref 135–145)
Total Bilirubin: 0.9 mg/dL (ref <1.2)
Total Protein: 7.1 g/dL (ref 6.5–8.1)

## 2023-09-16 LAB — CBC
HCT: 51.3 % — ABNORMAL HIGH (ref 36.0–46.0)
Hemoglobin: 16.7 g/dL — ABNORMAL HIGH (ref 12.0–15.0)
MCH: 31 pg (ref 26.0–34.0)
MCHC: 32.6 g/dL (ref 30.0–36.0)
MCV: 95.4 fL (ref 80.0–100.0)
Platelets: 143 10*3/uL — ABNORMAL LOW (ref 150–400)
RBC: 5.38 MIL/uL — ABNORMAL HIGH (ref 3.87–5.11)
RDW: 13.4 % (ref 11.5–15.5)
WBC: 8.3 10*3/uL (ref 4.0–10.5)
nRBC: 0 % (ref 0.0–0.2)

## 2023-09-16 LAB — SEDIMENTATION RATE: Sed Rate: 1 mm/h (ref 0–30)

## 2023-09-16 LAB — SURGICAL PCR SCREEN
MRSA, PCR: NEGATIVE
Staphylococcus aureus: POSITIVE — AB

## 2023-09-16 LAB — C-REACTIVE PROTEIN: CRP: 0.9 mg/dL (ref <1.0)

## 2023-09-18 ENCOUNTER — Encounter: Payer: Self-pay | Admitting: Orthopedic Surgery

## 2023-09-18 NOTE — Progress Notes (Signed)
Perioperative / Anesthesia Services  Pre-Admission Testing Clinical Review / Pre-Operative Anesthesia Consult  Date: 09/18/23  Patient Demographics:  Name: Laurie Roy DOB:   23-Dec-1940 MRN:   [id number]  Planned Surgical Procedure(s):    Case: [id number] Date/Time: 09/21/23 1104   Procedure: COMPUTER ASSISTED TOTAL KNEE ARTHROPLASTY (Right: Knee)   Anesthesia type: Choice   Pre-op diagnosis: Primary osteoarthritis of right knee M17.11   Location: [facility] OR ROOM 01 / [facility]S FOR [facility]   Surgeons: Donato Heinz, MD     NOTE: Available PAT nursing documentation and vital signs have been reviewed. Clinical nursing staff has updated patient's PMH/PSHx, current medication list, and drug allergies/intolerances to ensure comprehensive history available to assist in medical decision making as it pertains to the aforementioned surgical procedure and anticipated anesthetic course. Extensive review of available clinical information personally performed. [place] PMH and PSHx updated with any diagnoses/procedures that  may have been inadvertently omitted during her intake with the pre-admission testing department's nursing staff.  Clinical Discussion:  Laurie Roy is a 82 y.o. female who is submitted for pre-surgical anesthesia review and clearance prior to her undergoing the above procedure. Patient has never been a smoker. Pertinent PMH includes: CAD, cardiomyopathy, HFrEF, cardiac murmur, aortic atherosclerosis, HTN, HLD, pulmonary hypertension, CKD-III, DOE, OSAH (requires nocturnal PAP therapy), OA, neuropathy, remote left breast cancer, anxiety.   Patient is followed by cardiology Corky Sing, MD). She was last seen in the cardiology clinic on 09/16/2023; notes reviewed. At the time of her clinic visit, patient reporting chronic exertional dyspnea that was reported to be stable and at baseline. Patient denied any chest pain, PND, orthopnea, palpitations, significant peripheral edema,  weakness, fatigue, vertiginous symptoms, or presyncope/syncope. Patient with a past medical history significant for cardiovascular diagnoses. Documented physical exam was grossly benign, providing no evidence of acute exacerbation and/or decompensation of the patient's known cardiovascular conditions.  Patient underwent diagnostic LEFT heart catheterization on 04/16/2021 revealing a moderately reduced left ventricular systolic function with an EF of 40%.  There was apical hypokinesis noted.  Multivessel CAD observed; 45% proximal RCA-1, 40% proximal RCA-2, 20% distal RCA, 40% mid to distal LCx, and 100% mid LAD.  Very little collateralization noted.  Interventional cardiology made the decision to defer intervention opting for continued aggressive medical management including high intensity lipid antihyperlipidemia therapy, hypertension control, and nitrate therapy for chest pain/shortness of breath.  Most recent myocardial perfusion imaging study was performed on 04/05/2021 revealing a moderately reduced left ventricular systolic function with an EF of 38%.  There was akinesis of the anterior and apical myocardium.  SPECT images demonstrated evidence of a moderate fixed perfusion abnormality in the distal anterior and apical myocardial regions.  Findings consistent with previous infarct and/or scar without evidence of myocardial ischemia.  Most recent TTE was performed on 02/10/2022 revealing a moderately reduced left ventricular systolic function with an EF of 35%.  There was global hypokinesis with apical akinesis.  Left ventricle was mildly enlarged. Left ventricular diastolic Doppler parameters consistent with abnormal relaxation (G1DD).  There was moderate biatrial enlargement.  Moderate right ventricular enlargement also noted.  There was trivial aortic and pulmonic valve regurgitation.  Moderate mitral and tricuspid valve regurgitation.  RVSP elevated at 53.7 mmHg consistent with pulmonary hypertension.   All transvalvular gradients were noted to be normal providing no evidence suggestive of valvular stenosis.  Aorta normal in size with no evidence of aneurysmal dilatation.  Blood pressure well controlled at 124/70 mmHg on currently  prescribed beta-blocker (carvedilol) and diuretic (furosemide + spironolactone) therapies.  Additionally, cardiomyopathy with resulting HFrEF being managed with additional ARB/ARNI Sherryll Burger) therapy.  Patient is on rosuvastatin for her HLD diagnosis and ASCVD prevention.  Patient is not diabetic.  She does have an OSAH diagnosis and is reported be compliant with prescribed nocturnal PAP therapy.  Functional capacity limited by arthritides.  Patient requires the use of a walker or cane for ambulation due to the aforementioned.  Patient also limited by her age and other multiple medical comorbidities.  She requires assistance with most of her ADLs/IADLs.  Patient questionably able to achieve 4 METS of physical activity without experiencing, at least to some degree, significant angina/anginal equivalent symptoms.  No changes were made to her medication regimen.  Patient to follow-up with outpatient cardiology in 4 months or sooner if needed.  Laurie Roy is scheduled for an COMPUTER ASSISTED TOTAL KNEE ARTHROPLASTY (Right: Knee) on 09/21/2023 with Dr. Francesco Sor, MD.  Given patient's past medical history significant for cardiovascular diagnoses, presurgical cardiac clearance was sought by the PAT team.  Per cardiology. "stable from cardiac standpoint. Has chronic NYHA class II-III exertional dyspnea which is unchanged. She will be at elevated risk (intermediate to high risk), but not prohibitive risk, for perioperative events for intermediate risk surgery considering comorbidities/age/functional status. I quoted 5 to 10% risk of adverse cardiac event around time of elective noncardiac surgery. Additional evaluation is not likely to further delineate or reduce perioperative risk. She  is optimized from cardiac standpoint, euvolemic on exam with good control of blood pressure and heart rate. Continue current medications during perioperative. Regional/spinal anesthesia with moderate sedation may lessen the risk of perioperative events if possible".  In review of her medication reconciliation, it is noted that patient is currently on prescribed daily antithrombotic therapy. Given her cardiovascular history, orthopedics has cleared patient to continue her daily low-dose ASA throughout her perioperative course.  Patient reports previous perioperative complications with anesthesia in the past. Patient has a PMH (+) for PONV. Symptoms and history of PONV will be discussed with patient by anesthesia team on the day of her procedure. Interventions will be ordered as deemed necessary based on patient's individual care needs as determined by anesthesiologist. In review of the available records, it is noted that patient underwent a neuraxial anesthetic course here at [facility] (ASA II) in 12/2020 without documented complications.      09/15/2023    2:30 PM 02/23/2023    2:43 PM 04/16/2021    5:15 PM  Vitals with BMI  Height 5\' 5"     Weight 213 lbs    BMI 35.44    Systolic  106 154  Diastolic  70 91  Pulse  59 60    Providers/Specialists:   NOTE: Primary physician provider listed below. Patient may have been seen by APP or partner within same practice.   PROVIDER ROLE / SPECIALTY LAST OV  Hooten, Illene Labrador, MD Orthopedics (Surgeon) 08/25/2023  Gauger, Hermenia Fiscal, NP Primary Care Provider 02/11/2023  Windell Norfolk, MD Cardiology 09/16/2023  Ned Clines, MD Pulmonary Medicine 05/11/2023   Allergies:  Patient has no known allergies.  Current Home Medications:   No current facility-administered medications for this encounter.    aspirin EC 81 MG tablet   carvedilol (COREG) 12.5 MG tablet   celecoxib (CELEBREX) 200 MG capsule    ENTRESTO 24-26 MG   furosemide (LASIX) 20 MG tablet   gabapentin (NEURONTIN) 100 MG capsule  JARDIANCE 10 MG TABS tablet   NON FORMULARY   rosuvastatin (CRESTOR) 5 MG tablet   spironolactone (ALDACTONE) 25 MG tablet   tiZANidine (ZANAFLEX) 4 MG tablet   venlafaxine XR (EFFEXOR-XR) 75 MG 24 hr capsule   vitamin B-12 (CYANOCOBALAMIN) 1000 MCG tablet   Vitamin D, Ergocalciferol, 50 MCG (2000 UT) CAPS   History:   Past Medical History:  Diagnosis Date   Anginal pain (HCC)    Anxiety    Aortic atherosclerosis (HCC)    Arthritis    Cardiomegaly    Cardiomyopathy (HCC)    a.) TTE 02/21/2021: EF 25%; b.) MV 04/05/2021: EF 38%; c.) TTE 02/10/2022: EF 35%   CKD (chronic kidney disease), stage III (HCC)    Complication of anesthesia    a.) PONV   Coronary artery disease    a.) MV 04/05/2021: ant/apical AK, fixed dist anterior and apical wall perfusion defect c/w prev infarc with +/- scar, no ischemia; b.) LHC 04/16/2021: 45*40% pRCA< 20% dRCA, 40% m-dLCx, 100% mLAD - med mgmt   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   Dyspnea on exertion    Heart murmur    HFrEF (heart failure with reduced ejection fraction) (HCC)    a.) TTE 02/21/2021: EF 25%, severe glob HK, triv TR, mild MR/PR, RVSP 25.8; b.) TTE 02/10/2022: EF 35%, glob HK, apical AK, G1DD, mod BAE, mild RVE, triv AR/PR, mod MR/TR, RVSP 53.7   History of bilateral cataract extraction    Hypercholesteremia    Hyperplastic colon polyp    Hypertension    IGT (impaired glucose tolerance)    Long-term use of aspirin therapy    Microalbuminuria    Neuropathy    OSA on CPAP    Polyarthralgia    PONV (postoperative nausea and vomiting)    Primary infiltrating lobular carcinoma of left breast in female [facility]) 2002   a.) s/p surgical excision + systemic chemotherapy + XRT   Pulmonary HTN (HCC)    a.) TTE 02/10/2022: RVSP 53.7 mmHg   Vitamin B12 deficiency    Vitamin D deficiency    Past Surgical History:  Procedure  Laterality Date   ABDOMINAL HYSTERECTOMY     APPLICATION OF WOUND VAC Left 12/10/2020   Procedure: APPLICATION OF PREVENA  WOUND VAC;  Surgeon: Donato Heinz, MD;  Location: [facility] ORS;  Service: Orthopedics;  Laterality: Left;   CATARACT EXTRACTION, BILATERAL     CHOLECYSTECTOMY     COLONOSCOPY     DILATION AND CURETTAGE OF UTERUS     KNEE ARTHROPLASTY Left 12/10/2020   Procedure: COMPUTER ASSISTED TOTAL KNEE ARTHROPLASTY;  Surgeon: Donato Heinz, MD;  Location: [facility] ORS;  Service: Orthopedics;  Laterality: Left;   LEFT HEART CATH AND CORONARY ANGIOGRAPHY Left 04/16/2021   Procedure: LEFT HEART CATH AND CORONARY ANGIOGRAPHY;  Surgeon: Lamar Blinks, MD;  Location: [facility] INVASIVE CV LAB;  Service: Cardiovascular;  Laterality: Left;   MASTECTOMY Left 2002   TUBAL LIGATION     UMBILICAL HERNIA REPAIR N/A    Family History  Problem Relation Age of Onset   Breast cancer Neg Hx    Social History   Tobacco Use   Smoking status: Never   Smokeless tobacco: Never  Vaping Use   Vaping status: Never Used  Substance Use Topics   Alcohol use: Not Currently   Drug use: Never    Pertinent Clinical Results:  LABS:   Hospital Outpatient Visit on 09/16/2023  Component Date Value Ref Range Status  CRP 09/16/2023 0.9  <1.0 mg/dL Final   Performed at [facility], [address] [place], [place] [address]   Sed Rate 09/16/2023 1  0 - 30 mm/hr Final   Performed at [facility], [address] [place], [place] [address]   MRSA, PCR 09/16/2023 NEGATIVE  NEGATIVE Final   Staphylococcus aureus 09/16/2023 POSITIVE (A)  NEGATIVE Final   Comment: (NOTE) The Xpert SA Assay (FDA approved for NASAL specimens in patients 39 years of age and older), is one component of a comprehensive surveillance program. It is not intended to diagnose infection nor to guide or monitor treatment. Performed at [facility], [address] [place], [place] [address]    WBC  09/16/2023 8.3  4.0 - 10.5 K/uL Final   RBC 09/16/2023 5.38 (H)  3.87 - 5.11 MIL/uL Final   Hemoglobin 09/16/2023 16.7 (H)  12.0 - 15.0 g/dL Final   HCT 56/38/7564 51.3 (H)  36.0 - 46.0 % Final   MCV 09/16/2023 95.4  80.0 - 100.0 fL Final   MCH 09/16/2023 31.0  26.0 - 34.0 pg Final   MCHC 09/16/2023 32.6  30.0 - 36.0 g/dL Final   RDW 33/29/5188 13.4  11.5 - 15.5 % Final   Platelets 09/16/2023 143 (L)  150 - 400 K/uL Final   nRBC 09/16/2023 0.0  0.0 - 0.2 % Final   Performed at [facility], [address] [place], [place] [address]   Sodium 09/16/2023 137  135 - 145 mmol/L Final   Potassium 09/16/2023 4.4  3.5 - 5.1 mmol/L Final   Chloride 09/16/2023 101  98 - 111 mmol/L Final   CO2 09/16/2023 27  22 - 32 mmol/L Final   Glucose, Bld 09/16/2023 81  70 - 99 mg/dL Final   Glucose reference range applies only to samples taken after fasting for at least 8 hours.   BUN 09/16/2023 32 (H)  8 - 23 mg/dL Final   Creatinine, Ser 09/16/2023 1.15 (H)  0.44 - 1.00 mg/dL Final   Calcium 63/11/6008 8.8 (L)  8.9 - 10.3 mg/dL Final   Total Protein 93/23/5573 7.1  6.5 - 8.1 g/dL Final   Albumin 22/12/5425 3.7  3.5 - 5.0 g/dL Final   AST 04/26/7627 15  15 - 41 U/L Final   ALT 09/16/2023 19  0 - 44 U/L Final   Alkaline Phosphatase 09/16/2023 76  38 - 126 U/L Final   Total Bilirubin 09/16/2023 0.9  <1.2 mg/dL Final   GFR, Estimated 09/16/2023 47 (L)  >60 mL/min Final   Comment: (NOTE) Calculated using the CKD-EPI Creatinine Equation (2021)    Anion gap 09/16/2023 9  5 - 15 Final   Performed at [facility], [address] [place], [place] [address]   Color, Urine 09/16/2023 YELLOW (A)  YELLOW Final   APPearance 09/16/2023 HAZY (A)  CLEAR Final   Specific Gravity, Urine 09/16/2023 1.024  1.005 - 1.030 Final   pH 09/16/2023 5.0  5.0 - 8.0 Final   Glucose, UA 09/16/2023 >=500 (A)  NEGATIVE mg/dL Final   Hgb urine dipstick 09/16/2023 SMALL (A)  NEGATIVE Final   Bilirubin Urine  09/16/2023 NEGATIVE  NEGATIVE Final   Ketones, ur 09/16/2023 NEGATIVE  NEGATIVE mg/dL Final   Protein, ur 61/60/7371 NEGATIVE  NEGATIVE mg/dL Final   Nitrite 04/28/9484 NEGATIVE  NEGATIVE Final   Leukocytes,Ua 09/16/2023 NEGATIVE  NEGATIVE Final   RBC / HPF 09/16/2023 0-5  0 - 5 RBC/hpf Final  WBC, UA 09/16/2023 0-5  0 - 5 WBC/hpf Final   Bacteria, UA 09/16/2023 NONE SEEN  NONE SEEN Final   Squamous Epithelial / HPF 09/16/2023 11-20  0 - 5 /HPF Final   Mucus 09/16/2023 PRESENT   Final   Performed at [facility], [address] [place], [place] [address]    ECG: Date: 09/16/2023 Rate: 58 bpm Rhythm: normal sinus Axis (leads I and aVF): Normal ST segment and T wave changes: No evidence of acute ST segment elevation or depression.  Evidence of a possible age undetermined anterolateral infarct present. Comparison: Similar to previous tracing obtained on 12/04/2020 NOTE: Tracing obtained at [facility]; unable for review. Above based on cardiologist's interpretation.    IMAGING / PROCEDURES: TRANSTHORACIC ECHOCARDIOGRAM performed on 02/10/2022 Moderately reduced left ventricular systolic function with an EF of 35% Global hypokinesis Apical akinesis Left ventricular diastolic Doppler parameters consistent with abnormal relaxation (G1DD). Mildly enlarged left ventricle Moderately enlarged right ventricle Moderate biatrial enlargement Trivial AR and PR Moderate MR and TR RVSP 53.7 mmHg Normal gradients; no valvular stenosis No pericardial effusion  CT CHEST WITHOUT CONTRAST performed on 08/29/2021 Mild coronary artery calcification.  Mild global cardiomegaly. Morphologic changes in keeping with pulmonary arterial hypertension. Small bilateral pleural effusions, ascites, and diffuse body wall subcutaneous edema possibly reflecting changes of cardiogenic failure. Mild patchy peribronchial infiltrate within the aerated right lower lobe, possibly related to acute  infection or aspiration. Aberrant origin of the right subclavian artery with diverticulum of Kommerell noted. Maximal dimension of the subclavian artery origin of a 24 x 26 mm. Aortic atherosclerosis  LEFT HEART CATHETERIZATION AND CORONARY ANGIOGRAPHY performed on 04/16/2021 Moderately reduced left ventricular systolic function with an EF of 40% Apical hypokinesis Multivessel CAD 45% proximal RCA-1 40% RCA-2 20% distal RCA 40% mid to distal LCx 100% mid LAD Recommendations Continue aggressive medical management for coronary artery disease and occluded LAD including high intensity cholesterol therapy, hypertension control, consideration of isosorbide for any chest pain or shortness of breath No further intervention at this time with discussion and second opinion with interventional cardiology    Impression and Plan:  Laurie Roy has been referred for pre-anesthesia review and clearance prior to her undergoing the planned anesthetic and procedural courses. Available labs, pertinent testing, and imaging results were personally reviewed by me in preparation for upcoming operative/procedural course. [facility] medical record has been updated following extensive record review and patient interview with PAT staff.   This patient has been appropriately cleared by cardiology with an overall MODERATE to HIGH risk of experiencing significant perioperative cardiovascular complications. Based on clinical review performed today (09/18/23), barring any significant acute changes in the patient's overall condition, it is anticipated that she will be able to proceed with the planned surgical intervention. Any acute changes in clinical condition may necessitate her procedure being postponed and/or cancelled. Patient will meet with anesthesia team (MD and/or CRNA) on the day of her procedure for preoperative evaluation/assessment. Questions regarding anesthetic course will be fielded at that time.    Pre-surgical instructions were reviewed with the patient during her PAT appointment, and questions were fielded to satisfaction by PAT clinical staff. She has been instructed on which medications that she will need to hold prior to surgery, as well as the ones that have been deemed safe/appropriate to take on the day of her procedure. As part of the general education provided by PAT, patient made aware both verbally and in writing, that she would need to abstain  from the use of any illegal substances during her perioperative course.  She was advised that failure to follow the provided instructions could necessitate case cancellation or result in serious perioperative complications up to and including death. Patient encouraged to contact PAT and/or her surgeon's office to discuss any questions or concerns that may arise prior to surgery; verbalized understanding.   Quentin Mulling, MSN, APRN, FNP-C, CEN [facility]  Perioperative Services Nurse Practitioner Phone: [phone] Fax: [phone] 09/18/23 3:40 PM  NOTE: This note has been prepared using Dragon dictation software. Despite my best ability to proofread, there is always the potential that unintentional transcriptional errors may still occur from this process.

## 2023-09-18 NOTE — TOC Progression Note (Signed)
Transition of Care [facility]) - Progression Note    Patient Details  Name: Laurie Roy MRN: [id number] Date of Birth: January 15, 1941  [facility] CM/SW Contact  Marlowe Sax, RN Phone Number: 09/18/2023, 11:32 AM  Clinical Narrative:     Received a call from Dr Elenor Legato office stating that the patient will need [facility] after Surgery she has HTA, no preference of agencies I reached out to [place] with [employer] who is INN with her Insurance and she accepted the Patient for [facility]       Expected Discharge Plan and Services                                               Social Determinants of Health (SDOH) Interventions SDOH Screenings   Food Insecurity: No Food Insecurity (08/19/2023)   Received from [facility]  Transportation Needs: No Transportation Needs (08/19/2023)   Received from [facility]  Utilities: Not At Risk (08/19/2023)   Received from [facility]  Financial Resource Strain: Low Risk  (08/19/2023)   Received from [facility]  Tobacco Use: Low Risk  (09/16/2023)   Received from [facility]    Readmission Risk Interventions     No data to display

## 2023-09-21 ENCOUNTER — Ambulatory Visit: Payer: PPO | Admitting: Urgent Care

## 2023-09-21 ENCOUNTER — Other Ambulatory Visit: Payer: Self-pay

## 2023-09-21 ENCOUNTER — Encounter
Admission: RE | Disposition: A | Discharge status: Home / Self Care | Payer: Self-pay | Source: Home / Self Care | Attending: Orthopedic Surgery

## 2023-09-21 ENCOUNTER — Observation Stay
Admission: RE | Admit: 2023-09-21 | Discharge: 2023-09-22 | Disposition: A | Discharge status: Home / Self Care | Payer: PPO | Attending: Orthopedic Surgery | Admitting: Orthopedic Surgery

## 2023-09-21 ENCOUNTER — Encounter: Payer: Self-pay | Admitting: Orthopedic Surgery

## 2023-09-21 ENCOUNTER — Observation Stay: Payer: PPO

## 2023-09-21 DIAGNOSIS — E785 Hyperlipidemia, unspecified: Secondary | ICD-10-CM | POA: Diagnosis not present

## 2023-09-21 DIAGNOSIS — I10 Essential (primary) hypertension: Secondary | ICD-10-CM | POA: Diagnosis not present

## 2023-09-21 DIAGNOSIS — Z96651 Presence of right artificial knee joint: Secondary | ICD-10-CM | POA: Diagnosis not present

## 2023-09-21 DIAGNOSIS — I5022 Chronic systolic (congestive) heart failure: Secondary | ICD-10-CM | POA: Insufficient documentation

## 2023-09-21 DIAGNOSIS — Z7901 Long term (current) use of anticoagulants: Secondary | ICD-10-CM | POA: Diagnosis not present

## 2023-09-21 DIAGNOSIS — Z79899 Other long term (current) drug therapy: Secondary | ICD-10-CM | POA: Diagnosis not present

## 2023-09-21 DIAGNOSIS — M1711 Unilateral primary osteoarthritis, right knee: Secondary | ICD-10-CM | POA: Diagnosis not present

## 2023-09-21 DIAGNOSIS — N183 Chronic kidney disease, stage 3 unspecified: Secondary | ICD-10-CM | POA: Diagnosis not present

## 2023-09-21 DIAGNOSIS — F1090 Alcohol use, unspecified, uncomplicated: Secondary | ICD-10-CM | POA: Insufficient documentation

## 2023-09-21 DIAGNOSIS — I2510 Atherosclerotic heart disease of native coronary artery without angina pectoris: Secondary | ICD-10-CM | POA: Diagnosis not present

## 2023-09-21 DIAGNOSIS — F419 Anxiety disorder, unspecified: Secondary | ICD-10-CM | POA: Insufficient documentation

## 2023-09-21 DIAGNOSIS — R609 Edema, unspecified: Secondary | ICD-10-CM | POA: Diagnosis not present

## 2023-09-21 DIAGNOSIS — E538 Deficiency of other specified B group vitamins: Secondary | ICD-10-CM

## 2023-09-21 DIAGNOSIS — I130 Hypertensive heart and chronic kidney disease with heart failure and stage 1 through stage 4 chronic kidney disease, or unspecified chronic kidney disease: Secondary | ICD-10-CM | POA: Insufficient documentation

## 2023-09-21 DIAGNOSIS — Z01818 Encounter for other preprocedural examination: Secondary | ICD-10-CM

## 2023-09-21 HISTORY — DX: Polyp of colon: K63.5

## 2023-09-21 HISTORY — DX: Cataract extraction status, right eye: Z98.41

## 2023-09-21 HISTORY — DX: Long term (current) use of aspirin: Z79.82

## 2023-09-21 HISTORY — DX: Chronic kidney disease, stage 3 unspecified: N18.30

## 2023-09-21 HISTORY — DX: Obstructive sleep apnea (adult) (pediatric): G47.33

## 2023-09-21 HISTORY — DX: Pulmonary hypertension, unspecified: I27.20

## 2023-09-21 HISTORY — DX: Atherosclerosis of aorta: I70.0

## 2023-09-21 HISTORY — DX: Anxiety disorder, unspecified: F41.9

## 2023-09-21 HISTORY — DX: Vitamin D deficiency, unspecified: E55.9

## 2023-09-21 HISTORY — DX: Proteinuria, unspecified: R80.9

## 2023-09-21 HISTORY — DX: Other forms of dyspnea: R06.09

## 2023-09-21 HISTORY — DX: Cardiomyopathy, unspecified: I42.9

## 2023-09-21 HISTORY — DX: Unspecified systolic (congestive) heart failure: I50.20

## 2023-09-21 HISTORY — DX: Cardiomegaly: I51.7

## 2023-09-21 HISTORY — DX: Deficiency of other specified B group vitamins: E53.8

## 2023-09-21 HISTORY — PX: KNEE ARTHROPLASTY: SHX992

## 2023-09-21 HISTORY — DX: Cataract extraction status, right eye: Z98.42

## 2023-09-21 SURGERY — ARTHROPLASTY, KNEE, TOTAL, USING IMAGELESS COMPUTER-ASSISTED NAVIGATION
Anesthesia: Spinal | Site: Knee | Laterality: Right

## 2023-09-21 MED ORDER — FERROUS SULFATE 325 (65 FE) MG PO TABS
325.0000 mg | ORAL_TABLET | Freq: Two times a day (BID) | ORAL | Status: DC
  Administered 2023-09-21 – 2023-09-22 (×2): 325 mg via ORAL

## 2023-09-21 MED ORDER — ACETAMINOPHEN 10 MG/ML IV SOLN
INTRAVENOUS | Status: DC | PRN
  Administered 2023-09-21: 1000 mg via INTRAVENOUS

## 2023-09-21 MED ORDER — FLEET ENEMA RE ENEM: 1.0000 | ENEMA | Freq: Once | RECTAL | Status: DC | PRN

## 2023-09-21 MED ORDER — PROPOFOL 1000 MG/100ML IV EMUL
INTRAVENOUS | Status: AC
  Filled 2023-09-21: qty 100

## 2023-09-21 MED ORDER — LACTATED RINGERS IV SOLN: INTRAVENOUS | Status: DC

## 2023-09-21 MED ORDER — DEXAMETHASONE SODIUM PHOSPHATE 10 MG/ML IJ SOLN
INTRAMUSCULAR | Status: AC
  Filled 2023-09-21: qty 1

## 2023-09-21 MED ORDER — SODIUM CHLORIDE 0.9 % IR SOLN
Status: DC | PRN
  Administered 2023-09-21: 3000 mL

## 2023-09-21 MED ORDER — ENSURE PRE-SURGERY PO LIQD
296.0000 mL | Freq: Once | ORAL | Status: AC
  Administered 2023-09-21: 296 mL via ORAL
  Filled 2023-09-21: qty 296

## 2023-09-21 MED ORDER — GABAPENTIN 300 MG PO CAPS
300.0000 mg | ORAL_CAPSULE | Freq: Once | ORAL | Status: AC
  Administered 2023-09-21: 300 mg via ORAL

## 2023-09-21 MED ORDER — PHENYLEPHRINE HCL-NACL 20-0.9 MG/250ML-% IV SOLN
INTRAVENOUS | Status: DC | PRN
  Administered 2023-09-21: 20 ug/min via INTRAVENOUS

## 2023-09-21 MED ORDER — ACETAMINOPHEN 325 MG PO TABS: 325.0000 mg | ORAL_TABLET | Freq: Four times a day (QID) | ORAL | Status: DC | PRN

## 2023-09-21 MED ORDER — CARVEDILOL 12.5 MG PO TABS
ORAL_TABLET | ORAL | Status: AC
  Filled 2023-09-21: qty 1

## 2023-09-21 MED ORDER — CELECOXIB 200 MG PO CAPS
ORAL_CAPSULE | ORAL | Status: AC
  Filled 2023-09-21: qty 2

## 2023-09-21 MED ORDER — ACETAMINOPHEN 10 MG/ML IV SOLN
INTRAVENOUS | Status: AC
Start: 2023-09-21 — End: ?
  Filled 2023-09-21: qty 100

## 2023-09-21 MED ORDER — SURGIPHOR WOUND IRRIGATION SYSTEM - OPTIME
TOPICAL | Status: DC | PRN
  Administered 2023-09-21: 450 mL via TOPICAL

## 2023-09-21 MED ORDER — TRANEXAMIC ACID-NACL 1000-0.7 MG/100ML-% IV SOLN
INTRAVENOUS | Status: AC
  Filled 2023-09-21: qty 100

## 2023-09-21 MED ORDER — DIPHENHYDRAMINE HCL 12.5 MG/5ML PO ELIX: 12.5000 mg | ORAL_SOLUTION | ORAL | Status: DC | PRN

## 2023-09-21 MED ORDER — BISACODYL 10 MG RE SUPP: 10.0000 mg | Freq: Every day | RECTAL | Status: DC | PRN

## 2023-09-21 MED ORDER — CARVEDILOL 12.5 MG PO TABS
12.5000 mg | ORAL_TABLET | Freq: Two times a day (BID) | ORAL | Status: DC
  Administered 2023-09-21: 12.5 mg via ORAL

## 2023-09-21 MED ORDER — OXYCODONE HCL 5 MG/5ML PO SOLN: 5.0000 mg | Freq: Once | ORAL | Status: AC | PRN

## 2023-09-21 MED ORDER — SENNOSIDES-DOCUSATE SODIUM 8.6-50 MG PO TABS
1.0000 | ORAL_TABLET | Freq: Two times a day (BID) | ORAL | Status: DC
  Administered 2023-09-21 – 2023-09-22 (×2): 1 via ORAL

## 2023-09-21 MED ORDER — FUROSEMIDE 20 MG PO TABS
ORAL_TABLET | ORAL | Status: AC
  Filled 2023-09-21: qty 2

## 2023-09-21 MED ORDER — CEFAZOLIN SODIUM-DEXTROSE 2-4 GM/100ML-% IV SOLN
2.0000 g | INTRAVENOUS | Status: AC
  Administered 2023-09-21: 2 g via INTRAVENOUS

## 2023-09-21 MED ORDER — GABAPENTIN 300 MG PO CAPS
ORAL_CAPSULE | ORAL | Status: AC
  Filled 2023-09-21: qty 1

## 2023-09-21 MED ORDER — METOCLOPRAMIDE HCL 10 MG PO TABS
10.0000 mg | ORAL_TABLET | Freq: Three times a day (TID) | ORAL | Status: DC
  Administered 2023-09-21 – 2023-09-22 (×3): 10 mg via ORAL

## 2023-09-21 MED ORDER — FERROUS SULFATE 325 (65 FE) MG PO TABS
ORAL_TABLET | ORAL | Status: AC
  Filled 2023-09-21: qty 1

## 2023-09-21 MED ORDER — CELECOXIB 200 MG PO CAPS
ORAL_CAPSULE | ORAL | Status: AC
  Filled 2023-09-21: qty 1

## 2023-09-21 MED ORDER — FENTANYL CITRATE (PF) 100 MCG/2ML IJ SOLN: 25.0000 ug | INTRAMUSCULAR | Status: DC | PRN

## 2023-09-21 MED ORDER — PROPOFOL 500 MG/50ML IV EMUL
INTRAVENOUS | Status: DC | PRN
  Administered 2023-09-21: 75 ug/kg/min via INTRAVENOUS

## 2023-09-21 MED ORDER — GABAPENTIN 100 MG PO CAPS
200.0000 mg | ORAL_CAPSULE | Freq: Every day | ORAL | Status: DC
  Administered 2023-09-21: 200 mg via ORAL

## 2023-09-21 MED ORDER — METOCLOPRAMIDE HCL 10 MG PO TABS
ORAL_TABLET | ORAL | Status: AC
  Filled 2023-09-21: qty 1

## 2023-09-21 MED ORDER — BUPIVACAINE HCL (PF) 0.25 % IJ SOLN
INTRAMUSCULAR | Status: AC
  Filled 2023-09-21: qty 60

## 2023-09-21 MED ORDER — TRANEXAMIC ACID-NACL 1000-0.7 MG/100ML-% IV SOLN
1000.0000 mg | INTRAVENOUS | Status: AC
  Administered 2023-09-21: 1000 mg via INTRAVENOUS

## 2023-09-21 MED ORDER — CEFAZOLIN SODIUM-DEXTROSE 2-4 GM/100ML-% IV SOLN
2.0000 g | Freq: Four times a day (QID) | INTRAVENOUS | Status: DC
  Filled 2023-09-21: qty 100

## 2023-09-21 MED ORDER — BUPIVACAINE LIPOSOME 1.3 % IJ SUSP
INTRAMUSCULAR | Status: AC
  Filled 2023-09-21: qty 20

## 2023-09-21 MED ORDER — ONDANSETRON HCL 4 MG/2ML IJ SOLN
INTRAMUSCULAR | Status: DC | PRN
  Administered 2023-09-21: 4 mg via INTRAVENOUS

## 2023-09-21 MED ORDER — ASPIRIN 81 MG PO CHEW
CHEWABLE_TABLET | ORAL | Status: AC
Start: 2023-09-21 — End: ?
  Filled 2023-09-21: qty 1

## 2023-09-21 MED ORDER — FENTANYL CITRATE (PF) 100 MCG/2ML IJ SOLN
INTRAMUSCULAR | Status: AC
  Filled 2023-09-21: qty 2

## 2023-09-21 MED ORDER — OXYCODONE HCL 5 MG PO TABS
10.0000 mg | ORAL_TABLET | ORAL | Status: DC | PRN
Start: 2023-09-21 — End: 2023-09-22

## 2023-09-21 MED ORDER — ACETAMINOPHEN 10 MG/ML IV SOLN
1000.0000 mg | Freq: Four times a day (QID) | INTRAVENOUS | Status: DC
  Administered 2023-09-21 – 2023-09-22 (×3): 1000 mg via INTRAVENOUS
  Filled 2023-09-21: qty 100

## 2023-09-21 MED ORDER — ASPIRIN 81 MG PO CHEW
81.0000 mg | CHEWABLE_TABLET | Freq: Two times a day (BID) | ORAL | Status: DC
  Administered 2023-09-21 – 2023-09-22 (×2): 81 mg via ORAL

## 2023-09-21 MED ORDER — ACETAMINOPHEN 10 MG/ML IV SOLN
INTRAVENOUS | Status: AC
  Filled 2023-09-21: qty 100

## 2023-09-21 MED ORDER — CEFAZOLIN SODIUM-DEXTROSE 2-4 GM/100ML-% IV SOLN
INTRAVENOUS | Status: AC
  Filled 2023-09-21: qty 100

## 2023-09-21 MED ORDER — SACUBITRIL-VALSARTAN 24-26 MG PO TABS
1.0000 | ORAL_TABLET | Freq: Every day | ORAL | Status: DC
  Administered 2023-09-21: 1 via ORAL
  Filled 2023-09-21 (×2): qty 1

## 2023-09-21 MED ORDER — GABAPENTIN 300 MG PO CAPS
300.0000 mg | ORAL_CAPSULE | Freq: Every morning | ORAL | Status: DC
  Administered 2023-09-22: 300 mg via ORAL

## 2023-09-21 MED ORDER — VENLAFAXINE HCL ER 75 MG PO CP24
75.0000 mg | ORAL_CAPSULE | Freq: Every morning | ORAL | Status: DC
  Administered 2023-09-22: 75 mg via ORAL
  Filled 2023-09-21: qty 1

## 2023-09-21 MED ORDER — PHENYLEPHRINE HCL-NACL 20-0.9 MG/250ML-% IV SOLN
INTRAVENOUS | Status: AC
  Filled 2023-09-21: qty 250

## 2023-09-21 MED ORDER — CHLORHEXIDINE GLUCONATE 4 % EX SOLN: 60.0000 mL | Freq: Once | CUTANEOUS | Status: DC

## 2023-09-21 MED ORDER — MAGNESIUM HYDROXIDE 400 MG/5ML PO SUSP
30.0000 mL | Freq: Every day | ORAL | Status: DC
  Administered 2023-09-21 – 2023-09-22 (×2): 30 mL via ORAL

## 2023-09-21 MED ORDER — HYDROMORPHONE HCL 1 MG/ML IJ SOLN: 0.5000 mg | INTRAMUSCULAR | Status: DC | PRN

## 2023-09-21 MED ORDER — CELECOXIB 200 MG PO CAPS
200.0000 mg | ORAL_CAPSULE | Freq: Two times a day (BID) | ORAL | Status: DC
  Administered 2023-09-21 – 2023-09-22 (×2): 200 mg via ORAL

## 2023-09-21 MED ORDER — TRANEXAMIC ACID-NACL 1000-0.7 MG/100ML-% IV SOLN
1000.0000 mg | Freq: Once | INTRAVENOUS | Status: AC
  Administered 2023-09-21: 1000 mg via INTRAVENOUS

## 2023-09-21 MED ORDER — PANTOPRAZOLE SODIUM 40 MG PO TBEC
DELAYED_RELEASE_TABLET | ORAL | Status: AC
  Filled 2023-09-21: qty 1

## 2023-09-21 MED ORDER — TIZANIDINE HCL 4 MG PO TABS
4.0000 mg | ORAL_TABLET | Freq: Every day | ORAL | Status: DC
  Administered 2023-09-21: 4 mg via ORAL
  Filled 2023-09-21: qty 1

## 2023-09-21 MED ORDER — CEFAZOLIN SODIUM-DEXTROSE 2-4 GM/100ML-% IV SOLN
INTRAVENOUS | Status: AC
Start: 2023-09-21 — End: ?
  Filled 2023-09-21: qty 100

## 2023-09-21 MED ORDER — CHLORHEXIDINE GLUCONATE 0.12 % MT SOLN
OROMUCOSAL | Status: AC
Start: 2023-09-21 — End: ?
  Filled 2023-09-21: qty 15

## 2023-09-21 MED ORDER — TRAMADOL HCL 50 MG PO TABS: 50.0000 mg | ORAL_TABLET | ORAL | Status: DC | PRN

## 2023-09-21 MED ORDER — GABAPENTIN 100 MG PO CAPS
ORAL_CAPSULE | ORAL | Status: AC
  Filled 2023-09-21: qty 2

## 2023-09-21 MED ORDER — FENTANYL CITRATE (PF) 100 MCG/2ML IJ SOLN
INTRAMUSCULAR | Status: DC | PRN
  Administered 2023-09-21: 25 ug via INTRAVENOUS

## 2023-09-21 MED ORDER — CEFAZOLIN SODIUM-DEXTROSE 2-4 GM/100ML-% IV SOLN
2.0000 g | Freq: Four times a day (QID) | INTRAVENOUS | Status: AC
  Administered 2023-09-21 (×2): 2 g via INTRAVENOUS
  Filled 2023-09-21 (×2): qty 100

## 2023-09-21 MED ORDER — PHENOL 1.4 % MT LIQD: 1.0000 | OROMUCOSAL | Status: DC | PRN

## 2023-09-21 MED ORDER — MAGNESIUM HYDROXIDE 400 MG/5ML PO SUSP
ORAL | Status: AC
  Filled 2023-09-21: qty 30

## 2023-09-21 MED ORDER — BUPIVACAINE HCL (PF) 0.5 % IJ SOLN
INTRAMUSCULAR | Status: AC
  Filled 2023-09-21: qty 10

## 2023-09-21 MED ORDER — DEXAMETHASONE SODIUM PHOSPHATE 10 MG/ML IJ SOLN
8.0000 mg | Freq: Once | INTRAMUSCULAR | Status: AC
  Administered 2023-09-21: 8 mg via INTRAVENOUS

## 2023-09-21 MED ORDER — ONDANSETRON HCL 4 MG/2ML IJ SOLN: 4.0000 mg | Freq: Once | INTRAMUSCULAR | Status: DC | PRN

## 2023-09-21 MED ORDER — SPIRONOLACTONE 25 MG PO TABS
25.0000 mg | ORAL_TABLET | Freq: Every day | ORAL | Status: DC
  Administered 2023-09-22: 25 mg via ORAL
  Filled 2023-09-21 (×2): qty 1

## 2023-09-21 MED ORDER — ONDANSETRON HCL 4 MG/2ML IJ SOLN: 4.0000 mg | Freq: Four times a day (QID) | INTRAMUSCULAR | Status: DC | PRN

## 2023-09-21 MED ORDER — SODIUM CHLORIDE (PF) 0.9 % IJ SOLN
INTRAMUSCULAR | Status: DC | PRN
  Administered 2023-09-21: 120 mL

## 2023-09-21 MED ORDER — ONDANSETRON HCL 4 MG PO TABS: 4.0000 mg | ORAL_TABLET | Freq: Four times a day (QID) | ORAL | Status: DC | PRN

## 2023-09-21 MED ORDER — ORAL CARE MOUTH RINSE: 15.0000 mL | Freq: Once | OROMUCOSAL | Status: AC

## 2023-09-21 MED ORDER — ONDANSETRON HCL 4 MG/2ML IJ SOLN
INTRAMUSCULAR | Status: AC
  Filled 2023-09-21: qty 4

## 2023-09-21 MED ORDER — EPHEDRINE SULFATE-NACL 50-0.9 MG/10ML-% IV SOSY
PREFILLED_SYRINGE | INTRAVENOUS | Status: DC | PRN
  Administered 2023-09-21: 10 mg via INTRAVENOUS
  Administered 2023-09-21: 5 mg via INTRAVENOUS
  Administered 2023-09-21: 10 mg via INTRAVENOUS

## 2023-09-21 MED ORDER — EMPAGLIFLOZIN 10 MG PO TABS
10.0000 mg | ORAL_TABLET | Freq: Every day | ORAL | Status: DC
  Administered 2023-09-21 – 2023-09-22 (×2): 10 mg via ORAL
  Filled 2023-09-21 (×2): qty 1

## 2023-09-21 MED ORDER — FUROSEMIDE 20 MG PO TABS: 40.0000 mg | ORAL_TABLET | Freq: Every day | ORAL | Status: DC

## 2023-09-21 MED ORDER — SENNOSIDES-DOCUSATE SODIUM 8.6-50 MG PO TABS
ORAL_TABLET | ORAL | Status: AC
  Filled 2023-09-21: qty 1

## 2023-09-21 MED ORDER — EPHEDRINE 5 MG/ML INJ
INTRAVENOUS | Status: AC
  Filled 2023-09-21: qty 5

## 2023-09-21 MED ORDER — OXYCODONE HCL 5 MG PO TABS
5.0000 mg | ORAL_TABLET | ORAL | Status: DC | PRN
Start: 2023-09-21 — End: 2023-09-22

## 2023-09-21 MED ORDER — SODIUM CHLORIDE 0.9 % IV SOLN
INTRAVENOUS | Status: DC
Start: 2023-09-21 — End: 2023-09-22

## 2023-09-21 MED ORDER — GABAPENTIN 100 MG PO CAPS: 200.0000 mg | ORAL_CAPSULE | ORAL | Status: DC

## 2023-09-21 MED ORDER — OXYCODONE HCL 5 MG PO TABS
5.0000 mg | ORAL_TABLET | Freq: Once | ORAL | Status: AC | PRN
Start: 2023-09-21 — End: 2023-09-21
  Administered 2023-09-21: 5 mg via ORAL

## 2023-09-21 MED ORDER — PANTOPRAZOLE SODIUM 40 MG PO TBEC
40.0000 mg | DELAYED_RELEASE_TABLET | Freq: Two times a day (BID) | ORAL | Status: DC
  Administered 2023-09-21 – 2023-09-22 (×2): 40 mg via ORAL

## 2023-09-21 MED ORDER — ACETAMINOPHEN 10 MG/ML IV SOLN
1000.0000 mg | Freq: Once | INTRAVENOUS | Status: DC | PRN
Start: 2023-09-21 — End: 2023-09-21

## 2023-09-21 MED ORDER — BUPIVACAINE HCL (PF) 0.5 % IJ SOLN
INTRAMUSCULAR | Status: DC | PRN
  Administered 2023-09-21: 3 mL

## 2023-09-21 MED ORDER — ALUM & MAG HYDROXIDE-SIMETH 200-200-20 MG/5ML PO SUSP: 30.0000 mL | ORAL | Status: DC | PRN

## 2023-09-21 MED ORDER — OXYCODONE HCL 5 MG PO TABS
ORAL_TABLET | ORAL | Status: AC
  Filled 2023-09-21: qty 1

## 2023-09-21 MED ORDER — CELECOXIB 200 MG PO CAPS
400.0000 mg | ORAL_CAPSULE | Freq: Once | ORAL | Status: AC
  Administered 2023-09-21: 400 mg via ORAL

## 2023-09-21 MED ORDER — CHLORHEXIDINE GLUCONATE 0.12 % MT SOLN
15.0000 mL | Freq: Once | OROMUCOSAL | Status: AC
Start: 2023-09-21 — End: 2023-09-21
  Administered 2023-09-21: 15 mL via OROMUCOSAL

## 2023-09-21 MED ORDER — STERILE WATER FOR IRRIGATION IR SOLN
Status: DC | PRN
  Administered 2023-09-21: 1000 mL

## 2023-09-21 MED ORDER — ROSUVASTATIN CALCIUM 5 MG PO TABS
5.0000 mg | ORAL_TABLET | Freq: Every day | ORAL | Status: DC
  Administered 2023-09-21 – 2023-09-22 (×2): 5 mg via ORAL
  Filled 2023-09-21 (×2): qty 1

## 2023-09-21 MED ORDER — MENTHOL 3 MG MT LOZG: 1.0000 | LOZENGE | OROMUCOSAL | Status: DC | PRN

## 2023-09-21 SURGICAL SUPPLY — 68 items
ATTUNE MED DOME PAT 38 KNEE (Knees) IMPLANT
ATTUNE PS FEM RT SZ 4 CEM KNEE (Femur) IMPLANT
ATTUNE PSRP INSR SZ4 8 KNEE (Insert) IMPLANT
BASE TIBIAL ROT PLAT SZ 5 KNEE (Knees) IMPLANT
BATTERY INSTRU NAVIGATION (MISCELLANEOUS) ×4 IMPLANT
BIT DRILL QUICK REL 1/8 2PK SL (BIT) ×1 IMPLANT
BLADE SAW 70X12.5 (BLADE) ×1 IMPLANT
BLADE SAW 90X13X1.19 OSCILLAT (BLADE) ×1 IMPLANT
BLADE SAW 90X25X1.19 OSCILLAT (BLADE) ×1 IMPLANT
BRUSH SCRUB EZ PLAIN DRY (MISCELLANEOUS) ×1 IMPLANT
COOLER POLAR GLACIER W/PUMP (MISCELLANEOUS) ×1 IMPLANT
CUFF TOURN SGL QUICK 24 (TOURNIQUET CUFF)
CUFF TOURN SGL QUICK 30 (TOURNIQUET CUFF)
CUFF TRNQT CYL 24X4X16.5-23 (TOURNIQUET CUFF) IMPLANT
CUFF TRNQT CYL 30X4X21-28X (TOURNIQUET CUFF) IMPLANT
DRAPE SHEET LG 3/4 BI-LAMINATE (DRAPES) ×1 IMPLANT
DRSG AQUACEL AG ADV 3.5X14 (GAUZE/BANDAGES/DRESSINGS) ×1 IMPLANT
DRSG MEPILEX SACRM 8.7X9.8 (GAUZE/BANDAGES/DRESSINGS) ×1 IMPLANT
DRSG TEGADERM 4X4.75 (GAUZE/BANDAGES/DRESSINGS) ×1 IMPLANT
DURAPREP 26ML APPLICATOR (WOUND CARE) ×2 IMPLANT
ELECT CAUTERY BLADE 6.4 (BLADE) ×1 IMPLANT
ELECT REM PT RETURN 9FT ADLT (ELECTROSURGICAL) ×1
ELECTRODE REM PT RTRN 9FT ADLT (ELECTROSURGICAL) ×1 IMPLANT
EVACUATOR 1/8 PVC DRAIN (DRAIN) ×1 IMPLANT
EX-PIN ORTHOLOCK NAV 4X150 (PIN) ×2 IMPLANT
GAUZE XEROFORM 1X8 LF (GAUZE/BANDAGES/DRESSINGS) ×1 IMPLANT
GLOVE BIOGEL M STRL SZ7.5 (GLOVE) ×6 IMPLANT
GLOVE SURG UNDER POLY LF SZ8 (GLOVE) ×2 IMPLANT
GOWN STRL REUS W/ TWL LRG LVL3 (GOWN DISPOSABLE) ×1 IMPLANT
GOWN STRL REUS W/ TWL XL LVL3 (GOWN DISPOSABLE) ×1 IMPLANT
GOWN STRL REUS W/TWL LRG LVL3 (GOWN DISPOSABLE) ×1
GOWN STRL REUS W/TWL XL LVL3 (GOWN DISPOSABLE) ×1
GOWN TOGA ZIPPER T7+ PEEL AWAY (MISCELLANEOUS) ×1 IMPLANT
HOLDER FOLEY CATH W/STRAP (MISCELLANEOUS) ×1 IMPLANT
HOOD PEEL AWAY T7 (MISCELLANEOUS) ×1 IMPLANT
IV NS IRRIG 3000ML ARTHROMATIC (IV SOLUTION) ×1 IMPLANT
KIT TURNOVER KIT A (KITS) ×1 IMPLANT
KNIFE SCULPS 14X20 (INSTRUMENTS) ×1 IMPLANT
MANIFOLD NEPTUNE II (INSTRUMENTS) ×2 IMPLANT
NDL SPNL 20GX3.5 QUINCKE YW (NEEDLE) ×2 IMPLANT
NEEDLE SPNL 20GX3.5 QUINCKE YW (NEEDLE) ×2 IMPLANT
PACK TOTAL KNEE (MISCELLANEOUS) ×1 IMPLANT
PAD ABD DERMACEA PRESS 5X9 (GAUZE/BANDAGES/DRESSINGS) ×2 IMPLANT
PAD ARMBOARD 7.5X6 YLW CONV (MISCELLANEOUS) ×3 IMPLANT
PAD WRAPON POLAR KNEE (MISCELLANEOUS) ×1 IMPLANT
PENCIL SMOKE EVACUATOR COATED (MISCELLANEOUS) ×1 IMPLANT
PIN DRILL FIX HALF THREAD (BIT) ×2 IMPLANT
PIN FIXATION 1/8DIA X 3INL (PIN) ×1 IMPLANT
PULSAVAC PLUS IRRIG FAN TIP (DISPOSABLE) ×1
SOLUTION IRRIG SURGIPHOR (IV SOLUTION) ×1 IMPLANT
SPONGE DRAIN TRACH 4X4 STRL 2S (GAUZE/BANDAGES/DRESSINGS) ×1 IMPLANT
STAPLER SKIN PROX 35W (STAPLE) ×1 IMPLANT
STOCKINETTE BIAS CUT 6 980064 (GAUZE/BANDAGES/DRESSINGS) ×1 IMPLANT
STRAP TIBIA SHORT (MISCELLANEOUS) ×1 IMPLANT
SUCTION TUBE FRAZIER 10FR DISP (SUCTIONS) ×1 IMPLANT
SUT VIC AB 0 CT1 36 (SUTURE) ×1 IMPLANT
SUT VIC AB 1 CT1 36 (SUTURE) ×2 IMPLANT
SUT VIC AB 2-0 CT2 27 (SUTURE) ×1 IMPLANT
SYR 30ML LL (SYRINGE) ×2 IMPLANT
TIBIAL BASE ROT PLAT SZ 5 KNEE (Knees) ×1 IMPLANT
TIP FAN IRRIG PULSAVAC PLUS (DISPOSABLE) ×1 IMPLANT
TOWEL OR 17X26 4PK STRL BLUE (TOWEL DISPOSABLE) ×1 IMPLANT
TOWER CARTRIDGE SMART MIX (DISPOSABLE) ×1 IMPLANT
TRAP FLUID SMOKE EVACUATOR (MISCELLANEOUS) ×1 IMPLANT
TRAY FOLEY MTR SLVR 16FR STAT (SET/KITS/TRAYS/PACK) ×1 IMPLANT
TUBING CONNECTING 10 (TUBING) ×2 IMPLANT
WATER STERILE IRR 1000ML POUR (IV SOLUTION) ×1 IMPLANT
WRAPON POLAR PAD KNEE (MISCELLANEOUS) ×1

## 2023-09-21 NOTE — Evaluation (Signed)
Physical Therapy Evaluation Patient Details Name: Laurie Roy MRN: [id number] DOB: Mar 22, 1941 Today's Date: 09/21/2023  History of Present Illness  Pt is an 82 yo F diagnosed with degenerative arthrosis of the right knee and is s/p elective R TKA.  PMH includes anxiety, breast CA, CAD, HTN, CHF with EF 30-35%, and moderate mitral valve and tricuspid valve regurgitation.   Clinical Impression  Pt was pleasant and motivated to participate during the session and put forth good effort throughout. Pt required extra time and effort with bed mobility tasks and multi-modal cuing for sequencing with transfers but no physical assist.  Once in standing pt was able to do marching in place on each LE but struggled with taking steps near the EOB and from bed to chair even with heavy cuing for sequencing.  At one point pt required min A to prevent LOB while turning from bed to chair.  Pt reported no adverse symptoms during the session other than 3/10 R knee pain pre and post session but did report increased pain during WB only.  Pt will benefit from continued PT services upon discharge to safely address deficits listed in patient problem list for decreased caregiver assistance and eventual return to PLOF.          If plan is discharge home, recommend the following: A lot of help with walking and/or transfers;A little help with bathing/dressing/bathroom;Assistance with cooking/housework;Direct supervision/assist for medications management;Assist for transportation;Help with stairs or ramp for entrance   Can travel by private vehicle   No    Equipment Recommendations Rolling walker (2 wheels);BSC/3in1  Recommendations for Other Services       Functional Status Assessment Patient has had a recent decline in their functional status and demonstrates the ability to make significant improvements in function in a reasonable and predictable amount of time.     Precautions / Restrictions  Precautions Precautions: Fall Restrictions Weight Bearing Restrictions: Yes RLE Weight Bearing: Weight bearing as tolerated Other Position/Activity Restrictions: Pt able to perform Ind RLE SLR with no extensor lag, no KI required      Mobility  Bed Mobility Overal bed mobility: Modified Independent             General bed mobility comments: Extra time and effort and use of bed rails during sup to sit    Transfers Overall transfer level: Needs assistance Equipment used: Rolling walker (2 wheels) Transfers: Sit to/from Stand Sit to Stand: Contact guard assist           General transfer comment: Mod multi-modal cues for proper sequencing during sit to/from stand from various height surfaces    Ambulation/Gait Ambulation/Gait assistance: Min assist Gait Distance (Feet): 6 Feet Assistive device: Rolling walker (2 wheels) Gait Pattern/deviations: Step-to pattern, Decreased step length - left, Decreased stance time - right, Antalgic Gait velocity: decreased     General Gait Details: Antalgic gait pattern on the RLE with pt only able to take several efforful steps forwards/backwards at the EOB and then from bed to chair with one instance of min A needed to prevent LOB  Stairs            Wheelchair Mobility     Tilt Bed    Modified Rankin (Stroke Patients Only)       Balance Overall balance assessment: Needs assistance Sitting-balance support: Feet unsupported, Single extremity supported Sitting balance-Leahy Scale: Good     Standing balance support: Bilateral upper extremity supported, During functional activity, Reliant on assistive device for balance  Standing balance-Leahy Scale: Poor Standing balance comment: one instance of Min A to prevent LOB in standing                             Pertinent Vitals/Pain Pain Assessment Pain Assessment: 0-10 Pain Score: 3  Pain Location: R knee Pain Descriptors / Indicators: Aching, Sore Pain  Intervention(s): Monitored during session, Premedicated before session, Repositioned, Ice applied    Home Living Family/patient expects to be discharged to:: Private residence Living Arrangements: Children Available Help at Discharge: Family;Available 24 hours/day Type of Home: House Home Access: Stairs to enter Entrance Stairs-Rails: Right Entrance Stairs-Number of Steps: 4   Home Layout: Two level;Able to live on main level with bedroom/bathroom Home Equipment: Rollator (4 wheels);Standard Walker;Cane - single point;Shower seat - built in Additional Comments: Daughter lives with patient, 24/7 supervision available    Prior Function Prior Level of Function : Needs assist;History of Falls (last six months)             Mobility Comments: Mod Ind amb with a rollator in the home and a SPC in the community, community ambulator with one fall in the last 6 months secondary to tripping ADLs Comments: PRN assist with ADLs from daughter     Extremity/Trunk Assessment   Upper Extremity Assessment Upper Extremity Assessment: Overall WFL for tasks assessed    Lower Extremity Assessment Lower Extremity Assessment: Generalized weakness;RLE deficits/detail RLE Deficits / Details: BLE ankle AROM, strength, and sensation to light touch grossly intact; RLE hip flex strength >/= 3/5 RLE: Unable to fully assess due to pain RLE Sensation: WNL RLE Coordination: WNL       Communication   Communication Communication: No apparent difficulties Cueing Techniques: Verbal cues;Tactile cues;Visual cues  Cognition Arousal: Alert Behavior During Therapy: WFL for tasks assessed/performed Overall Cognitive Status: Within Functional Limits for tasks assessed                                          General Comments      Exercises Total Joint Exercises Ankle Circles/Pumps: AROM, Strengthening, Both, 10 reps Quad Sets: AROM, Strengthening, Right, 5 reps, 10 reps Straight Leg  Raises: AROM, Strengthening, Right, 5 reps Long Arc Quad: AROM, Strengthening, Right, 5 reps, 10 reps Knee Flexion: AROM, Strengthening, Right, 5 reps, 10 reps Goniometric ROM: R knee AROM: 5-82 deg Marching in Standing: AROM, Strengthening, Both, 5 reps, Standing Other Exercises Other Exercises: HEP education per handout Other Exercises: RLE positioning education to promote R knee ext PROM Other Exercises: Transfer sequencing education from various height surfaces   Assessment/Plan    PT Assessment Patient needs continued PT services  PT Problem List Decreased strength;Decreased range of motion;Decreased activity tolerance;Decreased balance;Decreased mobility;Decreased knowledge of use of DME;Pain       PT Treatment Interventions DME instruction;Gait training;Stair training;Functional mobility training;Therapeutic activities;Therapeutic exercise;Balance training;Patient/family education    PT Goals (Current goals can be found in the Care Plan section)  Acute Rehab PT Goals Patient Stated Goal: To walk better PT Goal Formulation: With patient Time For Goal Achievement: 10/04/23 Potential to Achieve Goals: Good    Frequency BID     Co-evaluation               AM-PAC PT "6 Clicks" Mobility  Outcome Measure Help needed turning from your back to your side while in  a flat bed without using bedrails?: A Little Help needed moving from lying on your back to sitting on the side of a flat bed without using bedrails?: A Little Help needed moving to and from a bed to a chair (including a wheelchair)?: A Little Help needed standing up from a chair using your arms (e.g., wheelchair or bedside chair)?: A Little Help needed to walk in hospital room?: A Lot Help needed climbing 3-5 steps with a railing? : Total 6 Click Score: 15    End of Session Equipment Utilized During Treatment: Gait belt Activity Tolerance: Patient tolerated treatment well Patient left: in chair;with call  bell/phone within reach;with family/visitor present;with SCD's reapplied;Other (comment) (polar care donned to R knee) Nurse Communication: Mobility status;Other (comment) (Min A for steps from bed to chair with RW) PT Visit Diagnosis: Unsteadiness on feet (R26.81);History of falling (Z91.81);Other abnormalities of gait and mobility (R26.89);Muscle weakness (generalized) (M62.81);Pain Pain - Right/Left: Right Pain - part of body: Knee    Time: 1610-9604 PT Time Calculation (min) (ACUTE ONLY): 34 min   Charges:   PT Evaluation $PT Eval Moderate Complexity: 1 Mod PT Treatments $Therapeutic Exercise: 8-22 mins PT General Charges $$ ACUTE PT VISIT: 1 Visit    D. Elly Modena PT, DPT 09/21/23, 5:33 PM

## 2023-09-21 NOTE — H&P (Signed)
ORTHOPAEDIC HISTORY & PHYSICAL Edilia Bo, [place] - 08/25/2023 2:15 PM EDT Formatting of this note is different from the original. Images from the original note were not included. Chief Complaint: Chief Complaint Patient presents with Right Shoulder - Pain injection  Reason for Visit: The patient is a 82 y.o. female who presents today for history and physical for an upcoming right total knee arthroplasty to be done on September 21, 2023. She has a several year history of progressive right knee pain. She localizes most of the pain along the medial and lateral aspect of the right knee. She reports some swelling, no locking, and significant giving way of the knee. The pain is aggravated by any weight bearing. The knee pain limits the patient's ability to ambulate long distances.The patient has not appreciated any significant improvement despite NSAIDs, activity modification, intra-articular corticosteroid injections, and ambulatory aids. She is using a cane for ambulation. The patient states that the right knee pain is beginning to interfere with her activities of daily living.  The patient is also here today because her right shoulder is quite painful. She had a glenohumeral cortisone injection 3 months ago and is requesting another 1 today. The patient is having trouble with raising her arm. She has popping and grinding with her right shoulder. She has to use a cane for ambulation.  Medications: Current Outpatient Medications Medication Sig Dispense Refill amoxicillin (AMOXIL) 500 MG capsule Take 4 capsules by mouth Prior to dental procedures aspirin 81 MG EC tablet Take 81 mg by mouth once daily carvediloL (COREG) 12.5 MG tablet take one tablet by mouth twice daily with meals 180 tablet 1 celecoxib (CELEBREX) 200 MG capsule Take 1 capsule (200 mg total) by mouth 2 (two) times daily 180 capsule 1 cyanocobalamin (VITAMIN B12) 1000 MCG tablet Take 1,000 mcg by mouth once daily ENTRESTO 24-26  mg tablet TAKE ONE TABLET EVERY 12 HOURS 60 tablet 11 FUROsemide (LASIX) 20 MG tablet Take 2 tablets (40 mg total) by mouth once daily 180 tablet 3 gabapentin (NEURONTIN) 100 MG capsule TAKE 2 CAPSULES (200 MG) BY MOUTH TWICE DAILY (Patient taking differently: 3 capsules in the morning and 2 capsules in the evening as directed) 360 capsule 1 JARDIANCE 10 mg tablet TAKE ONE TABLET EVERY DAY 30 tablet 11 PROAIR RESPICLICK 90 mcg/actuation inhaler INHALE 2 INHALATIONS INTO THE LUNGS EVERY 4 HOURS AS NEEDED FOR WHEEZING 1 each 3 rosuvastatin (CRESTOR) 5 MG tablet TAKE 1 TABLET BY MOUTH DAILY 90 tablet 1 spironolactone (ALDACTONE) 25 MG tablet Take 1 tablet (25 mg total) by mouth once daily 90 tablet 1 tiZANidine (ZANAFLEX) 4 MG tablet Take 1 tablet (4 mg total) by mouth at bedtime 30-60 minutes prior to bed 30 tablet 0 venlafaxine (EFFEXOR-XR) 75 MG XR capsule take 1 capsule by mouth once daily. 90 capsule 1  No current facility-administered medications for this visit.  Allergies: No Known Allergies  Past Medical History: Past Medical History: Diagnosis Date Anxiety Arthritis Multiple sites. Eval'd by ortho in the past. Bigeminy Breast cancer (CMS/HHS-HCC) 2001 Lt infiltrating lobular carcinoma. s/p lumpectomy x2 then mastectomy. Followed in the past by [facility] Cataract cortical, senile Coronary artery disease involving native coronary artery of native heart 05/10/2021 With previous anterior mi with occluded mid lad and other diffuse disease by cath 2022 Hyperlipidemia Hyperplastic colon polyp 2006 Hypertension IGT (impaired glucose tolerance) Microalbuminuria 08/2022 Neuropathy Presumed after bug/snake bite years ago. Obesity (BMI 30-39.9), unspecified OSA (obstructive sleep apnea) 06/25/2021 With AHI of 35 and  RDI of 36 Severe left ventricular systolic dysfunction 02/2021 EF 25-30% on echo Vitamin B12 deficiency 02/2017 Vitamin D deficiency 02/2017  Past Surgical  History: Past Surgical History: Procedure Laterality Date ARTHROSCOPY KNEE Left 1997 Dr. Ernest Pine CHOLECYSTECTOMY 2001 2/2 Gallstones MASTECTOMY Left 02/2001 Due to breast ca, after 2 failed lumpectomies. HEAD & NECK SKIN LESION EXCISIONAL BIOPSY Left 2004 Supraclavicular, above mastectomy site. COLONOSCOPY 11/07/2004 +Hyperplastic polyp/Repeat 82yrs CATARACT EXTRACTION Bilateral 2007 Left total knee arthroplasty using computer-assisted navigation 12/10/2020 Dr Ernest Pine HERNIA REPAIR Umbilical. MASTECTOMY PARTIAL / LUMPECTOMY Left x2 w/ failure requiring ultimate mastectomy. Right salpingo-oophorectomy  Social History: Social History  Socioeconomic History Marital status: Divorced Number of children: 2 Years of education: 13 Occupational History Occupation: Retired Tobacco Use Smoking status: Never Smokeless tobacco: Never Vaping Use Vaping status: Never Used Substance and Sexual Activity Alcohol use: Yes Comment: Beer once a month Drug use: No Sexual activity: Not Currently Partners: Male  Social Drivers of Catering manager Strain: Low Risk (08/19/2023) Overall Financial Resource Strain (CARDIA) Difficulty of Paying Living Expenses: Not very hard Food Insecurity: No Food Insecurity (08/19/2023) Hunger Vital Sign Worried About Running Out of Food in the Last Year: Never true Ran Out of Food in the Last Year: Never true Transportation Needs: No Transportation Needs (08/19/2023) PRAPARE - Contractor (Medical): No Lack of Transportation (Non-Medical): No Housing Stability: Low Risk (08/19/2023) Housing Stability Vital Sign Unable to Pay for Housing in the Last Year: No Number of Times Moved in the Last Year: 0 Homeless in the Last Year: No  Family History: Family History Problem Relation Name Age of Onset High blood pressure (Hypertension) Father Colon cancer Paternal Uncle Colon cancer Cousin  Review of Systems: A  comprehensive 14 point ROS was performed, reviewed, and the pertinent orthopaedic findings are documented in the HPI.  Exam BP 132/82  Ht 162.3 cm (5' 3.89")  Wt 96.6 kg (213 lb)  BMI 36.69 kg/m  General: Well-developed, well-nourished female seen in no acute distress. Antalgic gait. Valgus thrust to the left knee.  Lungs: Normal respiratory effort. No audible wheezing.  Cardiovascular: Peripheral pulses are palpable. No significant lower extremity edema. Homan`s test is negative. Regular rate and rhythm. No murmur auscultated.  Extremities: Good strength, stability, and range of motion of the upper extremities. Good range of motion of the hips and ankles.  Right Knee: Soft tissue swelling: mild Effusion: minimal Erythema: none Crepitance: mild Tenderness: medial, lateral Alignment: relative valgus Mediolateral laxity: lateral pseudolaxity Posterior sag: negative Patellar tracking: Good tracking without evidence of subluxation or tilt Atrophy: No significant atrophy. Quadriceps tone was good. Range of motion: 0/2/120 degrees  Left Knee: Soft tissue swelling: none Effusion: none Erythema: none Crepitance: none Tenderness: No focal tenderness Alignment: normal Mediolateral laxity: stable Atrophy: No significant atrophy. Quadriceps tone was fair to good. Range of Motion: 0/0/125 degrees  Neurologic: Awake, alert, and oriented. Sensory function is intact to pinprick and light touch. Motor strength is judged to be 5/5. Motor coordination is within normal limits. No apparent clonus. No tremor.  X-rays: I ordered and interpreted on August 25, 2023 with 4 views. Standing AP repeat standing AP, lateral, and sunrise radiographs of the right knee that were obtained in the office today. There is significant narrowing of the lateral cartilage space with bone-on-bone articulation and associated valgus alignment. Osteophyte formation is noted. Subchondral sclerosis is  noted.Degenerative changes to the patellofemoral articulation are noted. There appears to be an osteochondral loose body in the suprapatellar region.  No evidence of fracture or dislocation.  Impression: Degenerative arthrosis of the right knee Left total knee arthroplasty Right glenohumeral arthritis Obesity. BMI 36  Plan: The findings were discussed in detail with the patient. She continues to do well with regards to the left knee.  We discussed her right shoulder and the previous glenohumeral joint injection. The patient is requesting a right shoulder glenohumeral joint injection today.I discussed doing a right shoulder glenohumeral joint cortisone injection for the patient. The patient agreed and consented to the procedure after reviewing the risks, benefits, and alternatives.  Conservative treatment options for the right knee were reviewed with the patient. We discussed the risks and benefits of surgical intervention. The usual perioperative course was also discussed in detail. The patient expressed understanding of the treatment options and will continue with conservative measures at the present time. The risks, benefits, and alternatives to right total knee arthroplasty were discussed with the patient. She would like to set up for surgery on September 21, 2023 with Dr. Ernest Pine.  I spent a total of 30 minutes in both face-to-face and non-face-to-face activities, excluding procedures performed, for this visit on the date of this encounter.  ACTIVITY: As tolerated. WORK STATUS: Not applicable. THERAPY: Preoperative physical therapy evaluation. MEDICATIONS: Requested Prescriptions  No prescriptions requested or ordered in this encounter  FOLLOW-UP: No follow-ups on file.  Expected discharge plan: The expected discharge plan is for the patient to stay 1 night in observation. She will have a Hemovac removed on postop day 1. She will do physical therapy and work on physical therapy  protocol for Dr. Ernest Pine. The patient will be able to go home on postop day 1 with home health physical therapy.  This note will serve as the history and physical for surgery scheduled on September 21, 2023 for a right total knee arthroplasty to be done by Dr. Ernest Pine.  Right shoulder glenohumeral joint cortisone Injection Procedure: Consent After discussing the various treatment options for the condition, It was agreed that a shoulder cortisone injection would be the next step in treatment. The nature of and the indications for a corticosteroid and / or local anaesthetic injection were reviewed in detail with the patient today. The inherent risks of injection including infection, allergic reaction, increased pain, incomplete relief or temporary relief of symptoms, alterations of blood glucose levels requiring careful monitoring and treatment as indicated, tendon, ligament or articular cartilage rupture or degeneration, nerve injury, skin depigmentation, and/or fat atrophy were discussed. Procedure After the risks and benefits of the procedure were explained, consent was given. The site for the injection was properly marked and prepped with Ethanol solution. The posterior shoulder injection site was anesthetized with ethyl chloride. The right shoulder was injected with a glenohumeral joint space procedure, using 3 cc's 0.25% Marcaine and 1 cc of 40 mg of Kenalog. The procedure was well tolerated. The patient may proceed with normal function but otherwise rest the joint for a few more days before resuming regular activities. It may be more painful for the first 1-2 days. Watch for fever, or increased swelling or persistent pain in the joint. Call or return to clinic if such symptoms occur or there is failure to improve as anticipated.  Patient observed for 20 minutes following injection, no complications noted. The patient was discharged to home, advised to call with any questions or concerns.  JONATHAN T  MUNDY PA-C, M.S.  This note was generated in part with voice recognition software and I apologize for any typographical errors  that were not detected and corrected. Electronically signed by Edilia Bo, PA at 08/25/2023 2:49 PM EDT

## 2023-09-21 NOTE — Anesthesia Procedure Notes (Addendum)
Spinal  Patient location during procedure: OR Start time: 09/21/2023 11:19 AM End time: 09/21/2023 11:25 AM Reason for block: surgical anesthesia Staffing Performed: anesthesiologist  Anesthesiologist: Reed Breech, MD Resident/CRNA: Lindell Noe, RN Performed by: Reed Breech, MD Authorized by: Reed Breech, MD   Preanesthetic Checklist Completed: patient identified, IV checked, site marked, risks and benefits discussed, surgical consent, monitors and equipment checked, pre-op evaluation and timeout performed Spinal Block Patient position: sitting Prep: ChloraPrep Patient monitoring: heart rate, cardiac monitor, continuous pulse ox and blood pressure Approach: midline Location: L2-3 Injection technique: single-shot Needle Needle type: Whitacre  Needle gauge: 22 G Needle length: 9 cm Assessment Sensory level: T4 Events: CSF return and second provider Additional Notes Difficult placement 2/2 os at most passes.  Initial attempt L3-4 midline by SRNA Wall.  Second attempt L3-4 right paramedian by [facility] with os.  Third attempt L2-3 midline with Whitacre 22g by Vimal Derego successful.

## 2023-09-21 NOTE — Interval H&P Note (Signed)
History and Physical Interval Note:  09/21/2023 10:50 AM  Laurie Roy  has presented today for surgery, with the diagnosis of Primary osteoarthritis of right knee M17.11.  The various methods of treatment have been discussed with the patient and family. After consideration of risks, benefits and other options for treatment, the patient has consented to  Procedure(s): COMPUTER ASSISTED TOTAL KNEE ARTHROPLASTY (Right) as a surgical intervention.  The patient's history has been reviewed, patient examined, no change in status, stable for surgery.  I have reviewed the patient's chart and labs.  Questions were answered to the patient's satisfaction.     [name] P Ayansh Feutz

## 2023-09-21 NOTE — Anesthesia Preprocedure Evaluation (Addendum)
Anesthesia Evaluation  Patient identified by MRN, date of birth, ID band Patient awake    Reviewed: Allergy & Precautions, NPO status , Patient's Chart, lab work & pertinent test results  History of Anesthesia Complications (+) PONV and history of anesthetic complications  Airway Mallampati: II   Neck ROM: Full    Dental  (+) Missing   Pulmonary sleep apnea and Continuous Positive Airway Pressure Ventilation    Pulmonary exam normal breath sounds clear to auscultation       Cardiovascular hypertension, pulmonary hypertension+CHF (EF 30-35%)  Normal cardiovascular exam+ Valvular Problems/Murmurs (moderate MR and TR)  Rhythm:Regular Rate:Normal  Echo 02/10/22:  MODERATE LV SYSTOLIC DYSFUNCTION WITH AN ESTIMATED EF = 35-40 %  MODERATE RV SYSTOLIC DYSFUNCTION   MODERATE TRICUSPID VALVE INSUFFICIENCY  MODERATE MITRAL VALVE INSUFFICIENCY  TRACE AORTIC VALVE INSUFFICIENCY  NO VALVULAR STENOSIS  MODERATE RV ENLARGEMENT  MODERATE BIATRIAL ENLARGEMENT  MILD LV ENLARGEMENT  PLEURAL EFFUSION NOTED   Myocardial perfusion 04/05/21:  Normal treadmill EKG without evidence of ischemia or arrhythmia  Moderate fixed perfusion abnormality of moderate intensity of the distal  anterior and apical myocardial consistent with previous infarct and/or  scar without evidence of myocardial ischemia     Neuro/Psych  PSYCHIATRIC DISORDERS Anxiety      Neuromuscular disease (neuropathy)    GI/Hepatic negative GI ROS,,,  Endo/Other  Obesity   Renal/GU Renal disease (stage III CKD)     Musculoskeletal  (+) Arthritis ,    Abdominal   Peds  Hematology Breast CA   Anesthesia Other Findings Cardiology note 09/16/23:  82 y.o. female with Preop restratification, for knee surgery Chronic heart failure with reduced EF, moderate BiV dysfunction, LVEF 30-35% CAD, LHC/2022 with occluded mid LAD, mild disease LCX/RCA managed medically Moderate  MR/TR Hypertension, hyperlipidemia OSA on CPAP, obesity Limited baseline functional status with knee/back pain and balance issues  Stable from cardiac standpoint. Has chronic NYHA class II-III exertional dyspnea which is unchanged. She is euvolemic on exam. Blood pressure and heart rate well-controlled. She will be at elevated risk (intermediate to high risk) but not prohibitive risk for perioperative events for intermediate risk surgery considering comorbidities/age/functional status, I quoted 5 to 10% risk of adverse cardiac event around time of elective noncardiac surgery. Additional evaluation is not likely to further delineate or reduce perioperative risk. She is optimized from cardiac standpoint, euvolemic on exam with good control of blood pressure and heart rate. Continue current medications during perioperative. Regional/spinal anesthesia with moderate sedation may lessen the risk of perioperative events if possible.  Regarding heart failure, continue current goal-directed medical therapy, continue Coreg, Entresto, spironolactone, Jardiance. Euvolemic on exam. Continue p.o. Lasix. Continue low-dose aspirin and statin. Orders Placed This Encounter  Procedures  ECG 12-lead   Return in about 4 months (around 01/14/2024).    Reproductive/Obstetrics                             Anesthesia Physical Anesthesia Plan  ASA: 3  Anesthesia Plan: Spinal   Post-op Pain Management:    Induction: Intravenous  PONV Risk Score and Plan: 4 or greater and Propofol infusion, TIVA, Treatment may vary due to age or medical condition and Ondansetron  Airway Management Planned: Natural Airway and Nasal Cannula  Additional Equipment:   Intra-op Plan:   Post-operative Plan:   Informed Consent: I have reviewed the patients History and Physical, chart, labs and discussed the procedure including the risks, benefits and alternatives  for the proposed anesthesia with the patient or  authorized representative who has indicated his/her understanding and acceptance.       Plan Discussed with: CRNA  Anesthesia Plan Comments: (Plan for spinal and GA with natural airway, LMA/GETA backup.  Patient consented for risks of anesthesia including but not limited to:  - adverse reactions to medications - damage to eyes, teeth, lips or other oral mucosa - nerve damage due to positioning  - sore throat or hoarseness - headache, bleeding, infection, nerve damage 2/2 spinal - damage to heart, brain, nerves, lungs, other parts of body or loss of life  Informed patient about role of CRNA in peri- and intra-operative care.  Patient voiced understanding.)        Anesthesia Quick Evaluation

## 2023-09-21 NOTE — Op Note (Signed)
OPERATIVE NOTE  DATE OF SURGERY:  09/21/2023  PATIENT NAME:  ZORIAH MCSORLEY   DOB: May 18, 1941  MRN: [id number]  PRE-OPERATIVE DIAGNOSIS: Degenerative arthrosis of the right knee, primary  POST-OPERATIVE DIAGNOSIS:  Same  PROCEDURE:  Right total knee arthroplasty using computer-assisted navigation  SURGEON:  Jena Gauss. M.D.  ASSISTANT:  Gean Birchwood, PA-C (present and scrubbed throughout the case, critical for assistance with exposure, retraction, instrumentation, and closure)  ANESTHESIA: spinal  ESTIMATED BLOOD LOSS: 50 mL  FLUIDS REPLACED: 700 mL of crystalloid  TOURNIQUET TIME: 118 minutes  DRAINS: 2 medium Hemovac drains  SOFT TISSUE RELEASES: Anterior cruciate ligament, posterior cruciate ligament, deep  medial collateral ligament, patellofemoral ligament, and posterolateral corner  IMPLANTS UTILIZED: DePuy Attune size 4 posterior stabilized femoral component (cemented), size 5 rotating platform tibial component (cemented), 38 mm medialized dome patella (cemented), and an 8 mm stabilized rotating platform polyethylene insert.  INDICATIONS FOR SURGERY: CHADAE MARINA is a 82 y.o. year old female with a long history of progressive knee pain. X-rays demonstrated severe degenerative changes in tricompartmental fashion. The patient had not seen any significant improvement despite conservative nonsurgical intervention. After discussion of the risks and benefits of surgical intervention, the patient expressed understanding of the risks benefits and agree with plans for total knee arthroplasty.   The risks, benefits, and alternatives were discussed at length including but not limited to the risks of infection, bleeding, nerve injury, stiffness, blood clots, the need for revision surgery, cardiopulmonary complications, among others, and they were willing to proceed.  PROCEDURE IN DETAIL: The patient was brought into the operating room and, after adequate spinal anesthesia was  achieved, a tourniquet was placed on the patient's upper thigh. The patient's knee and leg were cleaned and prepped with alcohol and DuraPrep and draped in the usual sterile fashion. A "timeout" was performed as per usual protocol. The lower extremity was exsanguinated using an Esmarch, and the tourniquet was inflated to 300 mmHg. An anterior longitudinal incision was made followed by a standard mid vastus approach. The deep fibers of the medial collateral ligament were elevated in a subperiosteal fashion off of the medial flare of the tibia so as to maintain a continuous soft tissue sleeve. The patella was subluxed laterally and the patellofemoral ligament was incised. Inspection of the knee demonstrated severe degenerative changes with full-thickness loss of articular cartilage. Osteophytes were debrided using a rongeur. Anterior and posterior cruciate ligaments were excised. Two 4.0 mm Schanz pins were inserted in the femur and into the tibia for attachment of the array of trackers used for computer-assisted navigation. Hip center was identified using a circumduction technique. Distal landmarks were mapped using the computer. The distal femur and proximal tibia were mapped using the computer. The distal femoral cutting guide was positioned using computer-assisted navigation so as to achieve a 5 distal valgus cut. The femur was sized and it was felt that a size 4 femoral component was appropriate. A size 4 femoral cutting guide was positioned and the anterior cut was performed and verified using the computer. This was followed by completion of the posterior and chamfer cuts. Femoral cutting guide for the central box was then positioned in the center box cut was performed.  Attention was then directed to the proximal tibia. Medial and lateral menisci were excised. The extramedullary tibial cutting guide was positioned using computer-assisted navigation so as to achieve a 0 varus-valgus alignment and 3  posterior slope. The cut was performed and verified using the  computer. The proximal tibia was sized and it was felt that a size 5 tibial tray was appropriate. Tibial and femoral trials were inserted followed by insertion of a 5 mm polyethylene insert. The knee was felt to be tight laterally.  The trial components were removed and the knee was brought into full extension and distracted using the Moreland retractors.  The posterolateral corner was carefully released using a combination of electrocautery and Metzenbaum scissors.  Trial components were reinserted followed by placement of an 8 mm polyethylene trial.  This allowed for excellent mediolateral soft tissue balancing both in flexion and in full extension. Finally, the patella was cut and prepared so as to accommodate a 38 mm medialized dome patella. A patella trial was placed and the knee was placed through a range of motion with excellent patellar tracking appreciated. The femoral trial was removed after debridement of posterior osteophytes. The central post-hole for the tibial component was reamed followed by insertion of a keel punch. Tibial trials were then removed. Cut surfaces of bone were irrigated with copious amounts of normal saline using pulsatile lavage and then suctioned dry. Polymethylmethacrylate cement with gentamicin was prepared in the usual fashion using a vacuum mixer. Cement was applied to the cut surface of the proximal tibia as well as along the undersurface of a size 5 rotating platform tibial component. Tibial component was positioned and impacted into place. Excess cement was removed using Personal assistant. Cement was then applied to the cut surfaces of the femur as well as along the posterior flanges of the size 4 femoral component. The femoral component was positioned and impacted into place. Excess cement was removed using Personal assistant. An 8 mm polyethylene trial was inserted and the knee was brought into full extension with  steady axial compression applied. Finally, cement was applied to the backside of a 38 mm medialized dome patella and the patellar component was positioned and patellar clamp applied. Excess cement was removed using Personal assistant. After adequate curing of the cement, the tourniquet was deflated after a total tourniquet time of 118 minutes. Hemostasis was achieved using electrocautery. The knee was irrigated with copious amounts of normal saline using pulsatile lavage followed by 450 ml of Surgiphor and then suctioned dry. 20 mL of 1.3% Exparel and 60 mL of 0.25% Marcaine in 40 mL of normal saline was injected along the posterior capsule, medial and lateral gutters, and along the arthrotomy site. An 8 mm stabilized rotating platform polyethylene insert was inserted and the knee was placed through a range of motion with excellent mediolateral soft tissue balancing appreciated and excellent patellar tracking noted. 2 medium drains were placed in the wound bed and brought out through separate stab incisions. The medial parapatellar portion of the incision was reapproximated using interrupted sutures of #1 Vicryl. Subcutaneous tissue was approximated in layers using first #0 Vicryl followed #2-0 Vicryl. The skin was approximated with skin staples. A sterile dressing was applied.  The patient tolerated the procedure well and was transported to the recovery room in stable condition.    Jerri Hargadon P. Angie Fava., M.D.

## 2023-09-21 NOTE — Progress Notes (Signed)
Subjective: Day of Surgery Procedure(s) (LRB): COMPUTER ASSISTED TOTAL KNEE ARTHROPLASTY (Right) Patient reports pain as mild.   Patient seen in rounds with Dr. Ernest Pine. Patient is well, and has had no acute complaints or problems. Denies any CP, SOB, N/V, fevers or chills We will continue with therapy today.  Plan is to go Home after hospital stay.  Objective: Vital signs in last 24 hours: Temp:  [97.1 F (36.2 C)-97.7 F (36.5 C)] 97.6 F (36.4 C) (11/18 2011) Pulse Rate:  [56-74] 68 (11/18 2011) Resp:  [13-20] 16 (11/18 2011) BP: (123-154)/(71-94) 134/71 (11/18 2011) SpO2:  [93 %-99 %] 93 % (11/18 2011) Weight:  [96.6 kg] 96.6 kg (11/18 0950)  Intake/Output from previous day:  Intake/Output Summary (Last 24 hours) at 09/21/2023 2318 Last data filed at 09/21/2023 2000 Gross per 24 hour  Intake 1000 ml  Output 905 ml  Net 95 ml    Intake/Output this shift: Total I/O In: -  Out: 40 [Drains:40]  Labs: No results for input(s): "HGB" in the last 72 hours. No results for input(s): "WBC", "RBC", "HCT", "PLT" in the last 72 hours. No results for input(s): "NA", "K", "CL", "CO2", "BUN", "CREATININE", "GLUCOSE", "CALCIUM" in the last 72 hours. No results for input(s): "LABPT", "INR" in the last 72 hours.  EXAM General - Patient is Alert, Appropriate, and Oriented Extremity - Neurologically intact ABD soft Neurovascular intact Sensation intact distally Intact pulses distally Dorsiflexion/Plantar flexion intact No cellulitis present Compartment soft Dressing - dressing C/D/I and no drainage Motor Function - intact, moving foot and toes well on exam. Able to plantar and dorsi flex with good strength and ROM. Neurovascularly intact to all dermatomes down the right lower extremity. Posterior tibial pulses appreciated. JP Drain pulled without difficulty. 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   Dyspnea on exertion    Heart murmur    HFrEF (heart failure with reduced ejection fraction) (HCC)    a.) TTE 02/21/2021: EF 25%, severe glob HK, triv TR, mild MR/PR, RVSP 25.8; b.) TTE 02/10/2022: EF 35%, glob HK, apical AK, G1DD, mod BAE, mild RVE, triv AR/PR, mod MR/TR, RVSP 53.7   History of bilateral cataract extraction    Hypercholesteremia    Hyperplastic colon polyp    Hypertension    IGT (impaired glucose tolerance)    Long-term use of aspirin therapy    Microalbuminuria    Neuropathy    OSA on CPAP    Polyarthralgia    PONV (postoperative nausea and vomiting)    Primary infiltrating lobular carcinoma of left breast in female [facility]) 2002   a.) s/p surgical excision + systemic chemotherapy + XRT   Pulmonary HTN (HCC)    a.) TTE 02/10/2022: RVSP 53.7 mmHg   Vitamin B12 deficiency    Vitamin D deficiency  Assessment/Plan: Day of Surgery Procedure(s) (LRB): COMPUTER ASSISTED TOTAL KNEE ARTHROPLASTY (Right) Principal Problem:   History of total knee arthroplasty, right  Estimated body mass index is 35.44 kg/m as calculated from the following:   Height as of this encounter: 5\' 5"  (1.651 m).   Weight as of this encounter: 96.6 kg. Advance diet Up with therapy  Patient will continue to work with physical therapy to pass postoperative PT protocols, ROM and strengthening  Discussed with the patient continuing to utilize Polar Care  Patient will use bone  foam in 20-30 minute intervals  Patient will wear TED hose bilaterally to help prevent DVT and clot formation  Discussed the Aquacel bandage.  This bandage will stay in place 7 days postoperatively.  Can be replaced with honeycomb bandages that will be sent home with the patient  Discussed sending the patient home with tramadol and oxycodone for as needed pain management.  Patient will also be sent home with Celebrex to help with swelling and inflammation.  Patient will take an 81 mg aspirin twice daily for DVT prophylaxis  JP drain removed without difficulty, intact  Weight-Bearing as tolerated to right leg  Patient will follow-up with [facility] in 2 weeks for staple removal and reevaluation  Rayburn Go, PA-C [facility] 09/21/2023, 11:18 PM

## 2023-09-21 NOTE — Transfer of Care (Signed)
Immediate Anesthesia Transfer of Care Note  Patient: Laurie Roy  Procedure(s) Performed: COMPUTER ASSISTED TOTAL KNEE ARTHROPLASTY (Right: Knee)  Patient Location: PACU  Anesthesia Type:Spinal  Level of Consciousness: awake, alert , and oriented  Airway & Oxygen Therapy: Patient Spontanous Breathing  Post-op Assessment: Report given to RN, Post -op Vital signs reviewed and stable, and Patient moving all extremities X 4  Post vital signs: Reviewed and stable  Last Vitals:  Vitals Value Taken Time  BP 123/77 09/21/23 1532  Temp    Pulse 73 09/21/23 1535  Resp 20 09/21/23 1535  SpO2 97 % 09/21/23 1535  Vitals shown include unfiled device data.  Last Pain:  Vitals:   09/21/23 0950  TempSrc: Tympanic  PainSc: 0-No pain      Patients Stated Pain Goal: 0 (09/21/23 0950)  Complications: No notable events documented.

## 2023-09-21 NOTE — Discharge Summary (Signed)
Physician Discharge Summary  Subjective: Day of Surgery Procedure(s) (LRB): COMPUTER ASSISTED TOTAL KNEE ARTHROPLASTY (Right) Patient reports pain as mild.   Patient seen in rounds with Dr. Beau Fanny. Patient is well, and has had no acute complaints or problems. Denies any CP, SOB, N/V, fevers or chills We will continue to work with PT today Patient is ready to go home  Physician Discharge Summary  Patient ID: Laurie Roy MRN: [id number] DOB/AGE: September 26, 1941 82 y.o.  Admit date: 09/21/2023 Discharge date: 09/21/2023  Admission Diagnoses: Right knee osteoarthritis, primary   Discharge Diagnoses:  Principal Problem:   History of total knee arthroplasty, right   Discharged Condition: good  Hospital Course: Patient presented to the hospital on 09/21/2019 for for an elective right total knee arthroplasty performed by Dr. Ernest Pine. The patient was given 1 g of TXA and 2 g of Ancef perioperatively. She tolerated the procedure well without any complications or issues. Posterolateral releases were preformed. See procedural note below for details. Postoperatively, the patient did well. She was able to pass her PT protocols postop day 1. She was able to void her bladder. Her JP drain was removed without difficulty, intact. Her vital signs are stable and physical exam is unremarkable. She is not having any CP, SOB, N/B, fevers or chills. Patient is stable for discharge home.  PRE-OPERATIVE DIAGNOSIS: Degenerative arthrosis of the right knee, primary   POST-OPERATIVE DIAGNOSIS:  Same   PROCEDURE:  Right total knee arthroplasty using computer-assisted navigation   SURGEON:  Jena Gauss. M.D.   ASSISTANT:  Gean Birchwood, PA-C (present and scrubbed throughout the case, critical for assistance with exposure, retraction, instrumentation, and closure)   ANESTHESIA: spinal   ESTIMATED BLOOD LOSS: 50 mL   FLUIDS REPLACED: 700 mL of crystalloid   TOURNIQUET TIME: 118 minutes   DRAINS: 2  medium Hemovac drains   SOFT TISSUE RELEASES: Anterior cruciate ligament, posterior cruciate ligament, deep  medial collateral ligament, patellofemoral ligament, and posterolateral corner   IMPLANTS UTILIZED: DePuy Attune size 4 posterior stabilized femoral component (cemented), size 5 rotating platform tibial component (cemented), 38 mm medialized dome patella (cemented), and an 8 mm stabilized rotating platform polyethylene insert.   Treatments: none  Discharge Exam: Blood pressure 134/71, pulse 68, temperature 97.6 F (36.4 C), resp. rate 16, height 5\' 5"  (1.651 m), weight 96.6 kg, SpO2 93%.   Disposition: home   Allergies as of 09/21/2023   No Known Allergies   Med Rec must be completed prior to using this [facility]***        Durable Medical Equipment  (From admission, onward)           Start     Ordered   09/21/23 1626  DME Walker rolling  Once       Question:  Patient needs a walker to treat with the following condition  Answer:  Total knee replacement status   09/21/23 1625   09/21/23 1626  DME Bedside commode  Once       Comments: Patient is not able to walk the distance required to go the bathroom, or he/she is unable to safely negotiate stairs required to access the bathroom.  A 3in1 BSC will alleviate this problem  Question:  Patient needs a bedside commode to treat with the following condition  Answer:  Total knee replacement status   09/21/23 1625            Follow-up Information     Dedra Skeens, PA-C Follow up on 10/05/2023.  Specialty: Orthopedic Surgery Why: at 1:45pm Contact information: [address] [place] [address] [phone]         Donato Heinz, MD Follow up on 11/05/2023.   Specialty: Orthopedic Surgery Why: at 2:45pm Contact information: [address] [facility] [place] [address] [phone]                 Signed: Gean Birchwood 09/21/2023, 11:23  PM   Objective: Vital signs in last 24 hours: Temp:  [97.1 F (36.2 C)-97.7 F (36.5 C)] 97.6 F (36.4 C) (11/18 2011) Pulse Rate:  [56-74] 68 (11/18 2011) Resp:  [13-20] 16 (11/18 2011) BP: (123-154)/(71-94) 134/71 (11/18 2011) SpO2:  [93 %-99 %] 93 % (11/18 2011) Weight:  [96.6 kg] 96.6 kg (11/18 0950)  Intake/Output from previous day:  Intake/Output Summary (Last 24 hours) at 09/21/2023 2323 Last data filed at 09/21/2023 2000 Gross per 24 hour  Intake 1000 ml  Output 905 ml  Net 95 ml    Intake/Output this shift: Total I/O In: -  Out: 40 [Drains:40]  Labs: No results for input(s): "HGB" in the last 72 hours. No results for input(s): "WBC", "RBC", "HCT", "PLT" in the last 72 hours. No results for input(s): "NA", "K", "CL", "CO2", "BUN", "CREATININE", "GLUCOSE", "CALCIUM" in the last 72 hours. No results for input(s): "LABPT", "INR" in the last 72 hours.  EXAM: General - Patient is Alert, Appropriate, and Oriented Extremity - Neurologically intact ABD soft Neurovascular intact Sensation intact distally Intact pulses distally Dorsiflexion/Plantar flexion intact No cellulitis present Compartment soft Dressing - dressing C/D/I and no drainage Motor Function - intact, moving foot and toes well on exam. Able to plantar and dorsi flex with good strength and ROM. Neurovascularly intact to all dermatomes down the right lower extremity. Posterior tibial pulses appreciated. JP Drain pulled without difficulty. Intact Assessment/Plan: Day of Surgery Procedure(s) (LRB): COMPUTER ASSISTED TOTAL KNEE ARTHROPLASTY (Right) Procedure(s) (LRB): COMPUTER ASSISTED TOTAL KNEE ARTHROPLASTY (Right) Past Medical History:  Diagnosis Date   Anginal pain (HCC)    Anxiety    Aortic atherosclerosis (HCC)    Arthritis    Cardiomegaly    Cardiomyopathy (HCC)    a.) TTE 02/21/2021: EF 25%; b.) MV 04/05/2021: EF 38%; c.) TTE 02/10/2022: EF 35%   CKD (chronic kidney disease), stage III  (HCC)    Complication of anesthesia    a.) PONV   Coronary artery disease    a.) MV 04/05/2021: ant/apical AK, fixed dist anterior and apical wall perfusion defect c/w prev infarc with +/- scar, no ischemia; b.) LHC 04/16/2021: 45*40% pRCA< 20% dRCA, 40% m-dLCx, 100% mLAD - med mgmt   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   Dyspnea on exertion    Heart murmur    HFrEF (heart failure with reduced ejection fraction) (HCC)    a.) TTE 02/21/2021: EF 25%, severe glob HK, triv TR, mild MR/PR, RVSP 25.8; b.) TTE 02/10/2022: EF 35%, glob HK, apical AK, G1DD, mod BAE, mild RVE, triv AR/PR, mod MR/TR, RVSP 53.7   History of bilateral cataract extraction    Hypercholesteremia    Hyperplastic colon polyp    Hypertension    IGT (impaired glucose tolerance)    Long-term use of aspirin therapy    Microalbuminuria    Neuropathy    OSA on CPAP    Polyarthralgia    PONV (postoperative nausea and vomiting)    Primary infiltrating lobular carcinoma of left breast in female [facility]) 2002  a.) s/p surgical excision + systemic chemotherapy + XRT   Pulmonary HTN (HCC)    a.) TTE 02/10/2022: RVSP 53.7 mmHg   Vitamin B12 deficiency    Vitamin D deficiency    Principal Problem:   History of total knee arthroplasty, right  Estimated body mass index is 35.44 kg/m as calculated from the following:   Height as of this encounter: 5\' 5"  (1.651 m).   Weight as of this encounter: 96.6 kg.  Patient will continue to work with home health physical therapy on gait, ROM and strengthening   Discussed with the patient continuing to utilize [employer]   Patient will use bone foam in 20-30 minute intervals   Patient will wear TED hose bilaterally to help prevent DVT and clot formation   Discussed the Aquacel bandage.  This bandage will stay in place 7 days postoperatively.  Can be replaced with honeycomb bandages that will be sent home with the patient   Discussed sending the patient home with  tramadol and oxycodone for as needed pain management.  Patient will continue with home Celebrex to help with swelling and inflammation.  Patient will take an 81 mg aspirin twice daily for DVT prophylaxis   JP drain removed without difficulty, intact   Weight-Bearing as tolerated to right leg   Patient will follow-up with [facility] clinic orthopedics in 2 weeks for staple removal and reevaluation   Diet - Regular diet Follow up - in 2 weeks Activity - WBAT Disposition - Home Condition Upon Discharge - Good DVT Prophylaxis - Aspirin and TED hose  Danise Edge, PA-C Orthopaedic Surgery 09/21/2023, 11:23 PM

## 2023-09-21 NOTE — Progress Notes (Signed)
Patient is not able to walk the distance required to go the bathroom, or he/she is unable to safely negotiate stairs required to access the bathroom.  A 3in1 BSC will alleviate this problem   Laurie Roy, Jr. M.D.  

## 2023-09-22 ENCOUNTER — Encounter: Payer: Self-pay | Admitting: Orthopedic Surgery

## 2023-09-22 DIAGNOSIS — M1711 Unilateral primary osteoarthritis, right knee: Secondary | ICD-10-CM | POA: Diagnosis not present

## 2023-09-22 DIAGNOSIS — Z96651 Presence of right artificial knee joint: Secondary | ICD-10-CM | POA: Diagnosis not present

## 2023-09-22 DIAGNOSIS — M1712 Unilateral primary osteoarthritis, left knee: Secondary | ICD-10-CM | POA: Diagnosis not present

## 2023-09-22 MED ORDER — OXYCODONE HCL 5 MG PO TABS: 5.0000 mg | ORAL_TABLET | ORAL | 0 refills | Status: AC | PRN

## 2023-09-22 MED ORDER — MAGNESIUM HYDROXIDE 400 MG/5ML PO SUSP
ORAL | Status: AC
  Filled 2023-09-22: qty 30

## 2023-09-22 MED ORDER — METOCLOPRAMIDE HCL 10 MG PO TABS
ORAL_TABLET | ORAL | Status: AC
  Filled 2023-09-22: qty 1

## 2023-09-22 MED ORDER — CELECOXIB 200 MG PO CAPS
ORAL_CAPSULE | ORAL | Status: AC
  Filled 2023-09-22: qty 1

## 2023-09-22 MED ORDER — ASPIRIN 81 MG PO TBEC: 81.0000 mg | DELAYED_RELEASE_TABLET | Freq: Two times a day (BID) | ORAL | Status: AC

## 2023-09-22 MED ORDER — PANTOPRAZOLE SODIUM 40 MG PO TBEC
DELAYED_RELEASE_TABLET | ORAL | Status: AC
Start: 2023-09-22 — End: ?
  Filled 2023-09-22: qty 1

## 2023-09-22 MED ORDER — ACETAMINOPHEN 10 MG/ML IV SOLN
INTRAVENOUS | Status: AC
  Filled 2023-09-22: qty 100

## 2023-09-22 MED ORDER — ASPIRIN 81 MG PO CHEW
CHEWABLE_TABLET | ORAL | Status: AC
Start: 2023-09-22 — End: ?
  Filled 2023-09-22: qty 1

## 2023-09-22 MED ORDER — SENNOSIDES-DOCUSATE SODIUM 8.6-50 MG PO TABS
ORAL_TABLET | ORAL | Status: AC
Start: 2023-09-22 — End: ?
  Filled 2023-09-22: qty 1

## 2023-09-22 MED ORDER — CELECOXIB 200 MG PO CAPS: 200.0000 mg | ORAL_CAPSULE | Freq: Two times a day (BID) | ORAL | 1 refills | Status: AC

## 2023-09-22 MED ORDER — TRAMADOL HCL 50 MG PO TABS: 50.0000 mg | ORAL_TABLET | ORAL | 0 refills | Status: AC | PRN

## 2023-09-22 MED ORDER — FUROSEMIDE 20 MG PO TABS
ORAL_TABLET | ORAL | Status: AC
Start: 2023-09-22 — End: ?
  Filled 2023-09-22: qty 2

## 2023-09-22 MED ORDER — FERROUS SULFATE 325 (65 FE) MG PO TABS
ORAL_TABLET | ORAL | Status: AC
  Filled 2023-09-22: qty 1

## 2023-09-22 MED ORDER — SODIUM CHLORIDE 0.9 % IV BOLUS: 250.0000 mL | Freq: Once | INTRAVENOUS | Status: DC

## 2023-09-22 MED ORDER — GABAPENTIN 300 MG PO CAPS
ORAL_CAPSULE | ORAL | Status: AC
  Filled 2023-09-22: qty 1

## 2023-09-22 NOTE — Evaluation (Signed)
Occupational Therapy Evaluation Patient Details Name: Laurie Roy MRN: [id number] DOB: 1941-07-25 Today's Date: 09/22/2023   History of Present Illness Pt is an 82 yo F diagnosed with degenerative arthrosis of the right knee and is s/p elective R TKA.  PMH includes anxiety, breast CA, CAD, HTN, CHF with EF 30-35%, and moderate mitral valve and tricuspid valve regurgitation.   Clinical Impression   Pt was seen for OT evaluation this date. Prior to hospital admission, pt reports PRN assist with ADL's from daughter. Pt lives with daughter who also helps with IADL's. Upon arrival to room pt supine in bed with friend at bedside, pt agreeable to tx. Pt completed all bed mobility with MOD I. Pt educated on polar care system and given hand out, pt verbalized understanding. Pt completed upper and lower body dressing from sit/to stand with CGA-Min A+RW. Pt ambulated to bathroom and did toilet t/f with CGA +RW. Pt completed clothing management and hygiene with supervision. Pt returned to bedside. Pt left supine in bed with all needs met. Anticipate the need for follow up OT services upon acute hospital DC.        If plan is discharge home, recommend the following: A little help with walking and/or transfers;A little help with bathing/dressing/bathroom;Assistance with cooking/housework;Assist for transportation;Help with stairs or ramp for entrance    Functional Status Assessment  Patient has had a recent decline in their functional status and demonstrates the ability to make significant improvements in function in a reasonable and predictable amount of time.  Equipment Recommendations  BSC/3in1    Recommendations for Other Services       Precautions / Restrictions Precautions Precautions: Fall;Knee Restrictions Weight Bearing Restrictions: Yes RLE Weight Bearing: Weight bearing as tolerated Other Position/Activity Restrictions: Pt able to perform Ind RLE SLR with no extensor lag, no KI required       Mobility Bed Mobility Overal bed mobility: Modified Independent             General bed mobility comments: Extra time and effort and use of bed rails during sup to sit Patient Response: Cooperative  Transfers Overall transfer level: Needs assistance Equipment used: Rolling walker (2 wheels) Transfers: Sit to/from Stand Sit to Stand: Contact guard assist                  Balance Overall balance assessment: Needs assistance Sitting-balance support: Feet unsupported, Single extremity supported Sitting balance-Leahy Scale: Good     Standing balance support: Bilateral upper extremity supported, During functional activity, Reliant on assistive device for balance Standing balance-Leahy Scale: Fair                             ADL either performed or assessed with clinical judgement   ADL Overall ADL's : Needs assistance/impaired                                     Functional mobility during ADLs: Supervision/safety;Set up;Contact guard assist;Minimal assistance;Rolling walker (2 wheels) General ADL Comments: Pt educated on polar care system and given hand out, pt verbalized understanding. Pt completed upper and lower body dressing from sit/to stand with Min A+RW. Pt ambulated to bathroom and did toilet t/f with CGA +RW. Pt completed clothing management and hygiene with supervision.     Vision Patient Visual Report: No change from baseline  Perception         Praxis         Pertinent Vitals/Pain Pain Assessment Pain Assessment: Faces Faces Pain Scale: Hurts a little bit Pain Location: R knee with WB Pain Descriptors / Indicators: Aching, Sore Pain Intervention(s): Monitored during session, Ice applied     Extremity/Trunk Assessment Upper Extremity Assessment Upper Extremity Assessment: Overall WFL for tasks assessed   Lower Extremity Assessment Lower Extremity Assessment: Generalized weakness;RLE deficits/detail        Communication Communication Communication: No apparent difficulties Cueing Techniques: Verbal cues;Tactile cues;Visual cues   Cognition Arousal: Alert Behavior During Therapy: WFL for tasks assessed/performed Overall Cognitive Status: Within Functional Limits for tasks assessed                                       General Comments       Exercises     Shoulder Instructions      Home Living Family/patient expects to be discharged to:: Private residence Living Arrangements: Children (daughter) Available Help at Discharge: Family;Available 24 hours/day Type of Home: House Home Access: Stairs to enter [employer] of Steps: 4 Entrance Stairs-Rails: Right Home Layout: Two level;Able to live on main level with bedroom/bathroom     Bathroom Shower/Tub: Producer, television/film/video: Standard     Home Equipment: Rollator (4 wheels);Standard Walker;Cane - single point;Shower seat - built in   Additional Comments: Daughter lives with patient, 24/7 supervision available      Prior Functioning/Environment Prior Level of Function : Needs assist;History of Falls (last six months)             Mobility Comments: Mod Ind amb with a rollator in the home and a SPC in the community, community ambulator with one fall in the last 6 months secondary to tripping ADLs Comments: PRN assist with ADLs from daughter. Pt reports daughter helps with IADL's        OT Problem List:        OT Treatment/Interventions:      OT Goals(Current goals can be found in the care plan section) Acute Rehab OT Goals Patient Stated Goal: to go home. OT Goal Formulation: With patient/family Time For Goal Achievement: 09/22/23 Potential to Achieve Goals: Good  OT Frequency:      Co-evaluation              AM-PAC OT "6 Clicks" Daily Activity     Outcome Measure Help from another person eating meals?: None Help from another person taking care of personal  grooming?: None Help from another person toileting, which includes using toliet, bedpan, or urinal?: A Little Help from another person bathing (including washing, rinsing, drying)?: A Little Help from another person to put on and taking off regular upper body clothing?: A Little Help from another person to put on and taking off regular lower body clothing?: A Little 6 Click Score: 20   End of Session Equipment Utilized During Treatment: Rolling walker (2 wheels) Nurse Communication: Mobility status  Activity Tolerance: Patient tolerated treatment well Patient left: in bed;with bed alarm set;with call bell/phone within reach;with family/visitor present                   Time: 4782-9562 OT Time Calculation (min): 24 min Charges:     Butch Penny, SOT

## 2023-09-22 NOTE — Progress Notes (Signed)
Physical Therapy Treatment Patient Details Name: Laurie Roy MRN: [id number] DOB: 1941-05-26 Today's Date: 09/22/2023   History of Present Illness Pt is an 82 yo F diagnosed with degenerative arthrosis of the right knee and is s/p elective R TKA.  PMH includes anxiety, breast CA, CAD, HTN, CHF with EF 30-35%, and moderate mitral valve and tricuspid valve regurgitation.    PT Comments  Pt was pleasant and motivated to participate during the session and put forth good effort throughout.  Of note pt's HR found to be in the 50s during chart review with MD notified and patient cleared for full participation with PT.  Pt required no physical assistance during the session but did require cuing and extra time and effort with all functional tasks. Pt was able to amb 60 feet with slow cadence but was steady with no overt LOB.  Pt able to ascend and descend steps with one rail with heavy cuing for sequencing and with significant effort but was generally steady with no buckling or LOB.  Pt reported no adverse symptoms during the session other than moderate R knee pain with HR increasing to the low 70s with exertion and SpO2 >/= 90% on room air.  Pt will benefit from continued PT services upon discharge to safely address deficits listed in patient problem list for decreased caregiver assistance and eventual return to PLOF.       If plan is discharge home, recommend the following: A lot of help with walking and/or transfers;A little help with bathing/dressing/bathroom;Assistance with cooking/housework;Direct supervision/assist for medications management;Assist for transportation;Help with stairs or ramp for entrance   Can travel by private vehicle     Yes  Equipment Recommendations  Rolling walker (2 wheels);BSC/3in1    Recommendations for Other Services       Precautions / Restrictions Precautions Precautions: Fall Restrictions Weight Bearing Restrictions: Yes RLE Weight Bearing: Weight bearing as  tolerated Other Position/Activity Restrictions: Pt able to perform Ind RLE SLR with no extensor lag, no KI required     Mobility  Bed Mobility Overal bed mobility: Modified Independent             General bed mobility comments: Extra time and effort and use of bed rails during sup to sit    Transfers Overall transfer level: Needs assistance Equipment used: Rolling walker (2 wheels) Transfers: Sit to/from Stand Sit to Stand: Contact guard assist           General transfer comment: Mod multi-modal cues for proper sequencing during sit to/from stand from various height surfaces    Ambulation/Gait Ambulation/Gait assistance: Contact guard assist Gait Distance (Feet): 60 Feet Assistive device: Rolling walker (2 wheels) Gait Pattern/deviations: Step-to pattern, Decreased step length - left, Decreased stance time - right, Antalgic Gait velocity: decreased     General Gait Details: Slow cadence and antalgic on the RLE with cues given for step-to sequencing for pain control   Stairs Stairs: Yes Stairs assistance: Contact guard assist Stair Management: One rail Right, Step to pattern, Forwards Number of Stairs: 4 General stair comments: Mod visual and verbal cues for sequencing with pt able to ascend and descend 1 step x 1 and then 4 steps x 1 with fair control and stability   Wheelchair Mobility     Tilt Bed    Modified Rankin (Stroke Patients Only)       Balance Overall balance assessment: Needs assistance Sitting-balance support: Feet unsupported, Single extremity supported Sitting balance-Leahy Scale: Good  Standing balance support: Bilateral upper extremity supported, During functional activity, Reliant on assistive device for balance Standing balance-Leahy Scale: Fair Standing balance comment: No overt LOB in standing or with gait this session                            Cognition Arousal: Alert Behavior During Therapy: WFL for tasks  assessed/performed Overall Cognitive Status: Within Functional Limits for tasks assessed                                          Exercises Total Joint Exercises Quad Sets: AROM, Strengthening, Right, 5 reps, 10 reps Straight Leg Raises: AROM, Strengthening, Right, 5 reps Long Arc Quad: AROM, Strengthening, Right, 5 reps, 10 reps Knee Flexion: AROM, Strengthening, Right, 5 reps, 10 reps Goniometric ROM: R knee AROM: 2-102 deg Other Exercises Other Exercises: HEP education review per handout Other Exercises: RLE positioning review to promote R knee ext PROM    General Comments        Pertinent Vitals/Pain Pain Assessment Pain Assessment: 0-10 Pain Score: 5  Pain Location: R knee with WB Pain Descriptors / Indicators: Aching, Sore Pain Intervention(s): Premedicated before session, Monitored during session, Ice applied, Repositioned    Home Living                          Prior Function            PT Goals (current goals can now be found in the care plan section) Progress towards PT goals: Progressing toward goals    Frequency    BID      PT Plan      Co-evaluation              AM-PAC PT "6 Clicks" Mobility   Outcome Measure  Help needed turning from your back to your side while in a flat bed without using bedrails?: A Little Help needed moving from lying on your back to sitting on the side of a flat bed without using bedrails?: A Little Help needed moving to and from a bed to a chair (including a wheelchair)?: A Little Help needed standing up from a chair using your arms (e.g., wheelchair or bedside chair)?: A Little Help needed to walk in hospital room?: A Little Help needed climbing 3-5 steps with a railing? : A Little 6 Click Score: 18    End of Session Equipment Utilized During Treatment: Gait belt Activity Tolerance: Patient tolerated treatment well Patient left: in chair;with call bell/phone within reach;with  family/visitor present;with SCD's reapplied Nurse Communication: Mobility status PT Visit Diagnosis: Unsteadiness on feet (R26.81);History of falling (Z91.81);Other abnormalities of gait and mobility (R26.89);Muscle weakness (generalized) (M62.81);Pain Pain - Right/Left: Right Pain - part of body: Knee     Time: 1610-9604 PT Time Calculation (min) (ACUTE ONLY): 36 min  Charges:    $Gait Training: 23-37 mins PT General Charges $$ ACUTE PT VISIT: 1 Visit                     D. Scott Adylin Hankey PT, DPT 09/22/23, 11:53 AM

## 2023-09-22 NOTE — Progress Notes (Signed)
Pts BP 115/63 HR 54, Josh PA notified, Per Josh ok to hold BP meds and beta blocker.

## 2023-09-22 NOTE — Progress Notes (Signed)
DISCHARGE NOTE:  Pt given discharge instructions and verbalized understanding. TED hose on both legs. BSC and walker sent with pt. Pt wheeled to car by staff. Family providing transportation.

## 2023-09-22 NOTE — TOC Initial Note (Signed)
Transition of Care [name]) - Initial/Assessment Note    Patient Details  Name: Laurie Roy MRN: [id number] Date of Birth: Mar 26, 1941  Transition [name]) CM/SW Contact:    Laurie Sax, RN Phone Number: 09/22/2023, 8:46 AM  Clinical Narrative:                 Laurie Roy accepted the patient for [name] services, [employer] to deliver a 3 in 1 and RW to the bedside  Expected Discharge Plan: Home w Home Health Services Barriers to Discharge: No Barriers Identified   Patient Goals and CMS Choice            Expected Discharge Plan and Services   Discharge Planning Services: CM Consult   Living arrangements for the past 2 months: Single Family Home Expected Discharge Date: 09/22/23               DME Arranged: Laurie Roy rolling, 3-N-1 DME Agency: [employer] Date DME Agency Contacted: 09/22/23 Time DME Agency Contacted: [phone] Representative spoke with at DME Agency: Laurie Roy Arranged: PT, OT Roy Agency: [facility] Date [facility] Agency Contacted: 09/22/23 Time Roy Agency Contacted: 0845 Representative spoke with at [facility] Agency: Laurie Roy  Prior Living Arrangements/Services Living arrangements for the past 2 months: Single Family Home   Patient language and need for interpreter reviewed:: Yes Do you feel safe going back to the place where you live?: Yes      Need for Family Participation in Patient Care: Yes (Comment) Care giver support system in place?: Yes (comment)   Criminal Activity/Legal Involvement Pertinent to Current Situation/Hospitalization: No - Comment as needed  Activities of Daily Living   ADL Screening (condition at time of admission) Independently performs ADLs?: Yes (appropriate for developmental age) Is the patient deaf or have difficulty hearing?: No Does the patient have difficulty seeing, even when wearing glasses/contacts?: No Does the patient have difficulty concentrating, remembering, or making decisions?: No  Permission Sought/Granted   Permission  granted to share information with : Yes, Verbal Permission Granted              Emotional Assessment Appearance:: Appears stated age Attitude/Demeanor/Rapport: Engaged Affect (typically observed): Pleasant, Accepting Orientation: : Oriented to Self, Oriented to Place, Oriented to  Time, Oriented to Situation Alcohol / Substance Use: Not Applicable Psych Involvement: No (comment)  Admission diagnosis:  History of total knee arthroplasty, right [J19.147] Patient Active Problem List   Diagnosis Date Noted   History of total knee arthroplasty, right 09/21/2023   Severe obesity (BMI 35.0-39.9) with comorbidity (HCC) 03/30/2023   Microalbuminuria 08/03/2022   Chronic systolic heart failure (HCC) 08/06/2021   OSA (obstructive sleep apnea) 06/25/2021   Coronary artery disease involving native coronary artery of native heart 05/10/2021   Angina pectoris (HCC) 04/16/2021   Abnormal ECG 03/13/2021   Status post total left knee replacement 03/09/2021   Severe left ventricular systolic dysfunction 02/01/2021   IGT (impaired glucose tolerance) 12/10/2020   Neuropathy 12/10/2020   Obesity (BMI 30-39.9) 12/10/2020   Primary osteoarthritis of right knee 09/16/2020   Polyarthralgia 03/26/2017   Vitamin B12 deficiency 02/01/2017   Vitamin D deficiency 02/01/2017   HTN (hypertension) 02/17/2014   Hypercholesterolemia 02/17/2014   PCP:  Laurie Buddy, NP Pharmacy:   [facility] - [place], [place] - [address] [place] [address] Phone: [phone] Fax: [phone]     Social Determinants of Health (SDOH) Social History: SDOH Screenings   Food Insecurity: No  Food Insecurity (09/21/2023)  Housing: Low Risk  (09/21/2023)  Transportation Needs: No Transportation Needs (09/21/2023)  Utilities: Not At Risk (09/21/2023)  Financial Resource Strain: Low Risk  (08/19/2023)   Received from [facility]  Tobacco Use: Low Risk   (09/21/2023)   SDOH Interventions:     Readmission Risk Interventions     No data to display

## 2023-09-22 NOTE — Anesthesia Postprocedure Evaluation (Signed)
Anesthesia Post Note  Patient: Laurie Roy  Procedure(s) Performed: COMPUTER ASSISTED TOTAL KNEE ARTHROPLASTY (Right: Knee)  Patient location during evaluation: Nursing Unit Anesthesia Type: Spinal Level of consciousness: awake Pain management: pain level controlled Respiratory status: spontaneous breathing Cardiovascular status: stable Postop Assessment: no headache Anesthetic complications: no   No notable events documented.   Last Vitals:  Vitals:   09/22/23 0248 09/22/23 0512  BP: (!) 93/55 (!) 142/75  Pulse: (!) 50 (!) 53  Resp: 18 16  Temp:  (!) 36.2 C  SpO2:  100%    Last Pain:  Vitals:   09/22/23 0512  TempSrc: Temporal  PainSc:                  Jaye Beagle

## 2023-09-22 NOTE — Progress Notes (Signed)
Physical Therapy Treatment Patient Details Name: Laurie Roy MRN: [id number] DOB: [birth year] Today's Date: 09/22/2023   History of Present Illness Pt is an 82 yo F diagnosed with degenerative arthrosis of the right knee and is s/p elective R TKA.  PMH includes anxiety, breast CA, CAD, HTN, CHF with EF 30-35%, and moderate mitral valve and tricuspid valve regurgitation.    PT Comments  Pt was pleasant and motivated to participate during the session and put forth good effort throughout. Pt made good progress towards goals this session most notably with improved cadence and step pattern during gait training and improved cadence and confidence during stair training.  Pt presented with no moments of instability during the session and demonstrated good carryover of proper sequencing with functional tasks.  Pt's friend who will be assisting pt home was present throughout the session with all questions from both patient and friend addressed.  Pt will benefit from continued PT services upon discharge to safely address deficits listed in patient problem list for decreased caregiver assistance and eventual return to PLOF.      If plan is discharge home, recommend the following: A lot of help with walking and/or transfers;A little help with bathing/dressing/bathroom;Assistance with cooking/housework;Direct supervision/assist for medications management;Assist for transportation;Help with stairs or ramp for entrance   Can travel by private vehicle     Yes  Equipment Recommendations  Rolling walker (2 wheels);BSC/3in1    Recommendations for Other Services       Precautions / Restrictions Precautions Precautions: Fall;Knee Restrictions Weight Bearing Restrictions: Yes RLE Weight Bearing: Weight bearing as tolerated Other Position/Activity Restrictions: Pt able to perform Ind RLE SLR with no extensor lag, no KI required     Mobility  Bed Mobility Overal bed mobility: Modified Independent              General bed mobility comments: NT, pt in recliner    Transfers Overall transfer level: Needs assistance Equipment used: Rolling walker (2 wheels) Transfers: Sit to/from Stand Sit to Stand: Contact guard assist           General transfer comment: Mod multi-modal cues for proper sequencing during sit to/from stand    Ambulation/Gait Ambulation/Gait assistance: Contact guard assist Gait Distance (Feet): 100 Feet Assistive device: Rolling walker (2 wheels) Gait Pattern/deviations: Step-to pattern, Decreased step length - left, Decreased stance time - right, Antalgic, Step-through pattern, Trunk flexed Gait velocity: decreased     General Gait Details: Slow cadence and antalgic on the RLE with cues given for general sequencing with the RW, step-to pattern gradually progressed to beginning step-through pattern with pt reporting decreased pain   Stairs Stairs: Yes Stairs assistance: Contact guard assist Stair Management: One rail Right, Step to pattern, Forwards Number of Stairs: 4 General stair comments: Min verbal and visual cues for sequencing with pt's friend present during training; improved confidence and cadence ascending and descending steps with no buckling or LOB   Wheelchair Mobility     Tilt Bed    Modified Rankin (Stroke Patients Only)       Balance Overall balance assessment: Needs assistance Sitting-balance support: Feet unsupported, Single extremity supported Sitting balance-Leahy Scale: Good     Standing balance support: Bilateral upper extremity supported, During functional activity, Reliant on assistive device for balance Standing balance-Leahy Scale: Good Standing balance comment: No overt LOB in standing or with gait this session  Cognition Arousal: Alert Behavior During Therapy: WFL for tasks assessed/performed Overall Cognitive Status: Within Functional Limits for tasks assessed                                           Exercises Total Joint Exercises Quad Sets: AROM, Strengthening, Right, 5 reps, 10 reps Straight Leg Raises: AROM, Strengthening, Right, 5 reps Long Arc Quad: AROM, Strengthening, Right, 5 reps, 10 reps Knee Flexion: AROM, Strengthening, Right, 5 reps, 10 reps Goniometric ROM: R knee AROM: 2-102 deg Other Exercises Other Exercises: Car transfer sequencing education Other Exercises: Stair training with pt and friend who will be assisting patient present Other Exercises: 180 deg turn training with cues for proper sequencing with the RW    General Comments        Pertinent Vitals/Pain Pain Assessment Pain Assessment: 0-10 Pain Score: 3  Pain Location: R knee with WB Pain Descriptors / Indicators: Aching, Sore Pain Intervention(s): Repositioned, Premedicated before session, Monitored during session, Ice applied    Home Living Family/patient expects to be discharged to:: Private residence Living Arrangements: Children (daughter) Available Help at Discharge: Family;Available 24 hours/day Type of Home: House Home Access: Stairs to enter Entrance Stairs-Rails: Right Entrance Stairs-Number of Steps: 4   Home Layout: Two level;Able to live on main level with bedroom/bathroom Home Equipment: Rollator (4 wheels);Standard Walker;Cane - single point;Shower seat - built in Additional Comments: Daughter lives with patient, 24/7 supervision available    Prior Function            PT Goals (current goals can now be found in the care plan section) Progress towards PT goals: Progressing toward goals    Frequency    BID      PT Plan      Co-evaluation              AM-PAC PT "6 Clicks" Mobility   Outcome Measure  Help needed turning from your back to your side while in a flat bed without using bedrails?: A Little Help needed moving from lying on your back to sitting on the side of a flat bed without using bedrails?: A Little Help  needed moving to and from a bed to a chair (including a wheelchair)?: A Little Help needed standing up from a chair using your arms (e.g., wheelchair or bedside chair)?: A Little Help needed to walk in hospital room?: A Little Help needed climbing 3-5 steps with a railing? : A Little 6 Click Score: 18    End of Session Equipment Utilized During Treatment: Gait belt Activity Tolerance: Patient tolerated treatment well Patient left: in chair;with call bell/phone within reach;with family/visitor present;with SCD's reapplied;Other (comment) (polar care donned to R knee) Nurse Communication: Mobility status PT Visit Diagnosis: Unsteadiness on feet (R26.81);History of falling (Z91.81);Other abnormalities of gait and mobility (R26.89);Muscle weakness (generalized) (M62.81);Pain Pain - Right/Left: Right Pain - part of body: Knee     Time: 1324-1401 PT Time Calculation (min) (ACUTE ONLY): 37 min  Charges:    $Gait Training: 23-37 mins PT General Charges $$ ACUTE PT VISIT: 1 Visit                     D. Scott Maciah Schweigert PT, DPT 09/22/23, 3:34 PM

## 2023-09-22 NOTE — Plan of Care (Signed)
  Problem: Activity: Goal: Ability to avoid complications of mobility impairment will improve Outcome: Progressing   Problem: Pain Management: Goal: Pain level will decrease with appropriate interventions Outcome: Progressing   Problem: Skin Integrity: Goal: Will show signs of wound healing Outcome: Progressing   

## 2023-09-23 DIAGNOSIS — I2510 Atherosclerotic heart disease of native coronary artery without angina pectoris: Secondary | ICD-10-CM | POA: Diagnosis not present

## 2023-09-23 DIAGNOSIS — G4733 Obstructive sleep apnea (adult) (pediatric): Secondary | ICD-10-CM | POA: Diagnosis not present

## 2023-09-23 DIAGNOSIS — N183 Chronic kidney disease, stage 3 unspecified: Secondary | ICD-10-CM | POA: Diagnosis not present

## 2023-09-23 DIAGNOSIS — Z96651 Presence of right artificial knee joint: Secondary | ICD-10-CM | POA: Diagnosis not present

## 2023-09-23 DIAGNOSIS — G609 Hereditary and idiopathic neuropathy, unspecified: Secondary | ICD-10-CM | POA: Diagnosis not present

## 2023-09-23 DIAGNOSIS — Z471 Aftercare following joint replacement surgery: Secondary | ICD-10-CM | POA: Diagnosis not present

## 2023-09-23 DIAGNOSIS — Z9989 Dependence on other enabling machines and devices: Secondary | ICD-10-CM | POA: Diagnosis not present

## 2023-09-23 DIAGNOSIS — I5020 Unspecified systolic (congestive) heart failure: Secondary | ICD-10-CM | POA: Diagnosis not present

## 2023-09-23 DIAGNOSIS — E7800 Pure hypercholesterolemia, unspecified: Secondary | ICD-10-CM | POA: Diagnosis not present

## 2023-09-23 DIAGNOSIS — I130 Hypertensive heart and chronic kidney disease with heart failure and stage 1 through stage 4 chronic kidney disease, or unspecified chronic kidney disease: Secondary | ICD-10-CM | POA: Diagnosis not present

## 2023-09-25 DIAGNOSIS — Z471 Aftercare following joint replacement surgery: Secondary | ICD-10-CM | POA: Diagnosis not present

## 2023-10-05 DIAGNOSIS — M25561 Pain in right knee: Secondary | ICD-10-CM | POA: Diagnosis not present

## 2023-10-05 DIAGNOSIS — Z96651 Presence of right artificial knee joint: Secondary | ICD-10-CM | POA: Diagnosis not present

## 2023-10-05 DIAGNOSIS — M25661 Stiffness of right knee, not elsewhere classified: Secondary | ICD-10-CM | POA: Diagnosis not present

## 2023-10-05 DIAGNOSIS — M6281 Muscle weakness (generalized): Secondary | ICD-10-CM | POA: Diagnosis not present

## 2023-10-08 DIAGNOSIS — M6281 Muscle weakness (generalized): Secondary | ICD-10-CM | POA: Diagnosis not present

## 2023-10-08 DIAGNOSIS — M25661 Stiffness of right knee, not elsewhere classified: Secondary | ICD-10-CM | POA: Diagnosis not present

## 2023-10-08 DIAGNOSIS — Z96651 Presence of right artificial knee joint: Secondary | ICD-10-CM | POA: Diagnosis not present

## 2023-10-08 DIAGNOSIS — M25561 Pain in right knee: Secondary | ICD-10-CM | POA: Diagnosis not present

## 2023-10-13 DIAGNOSIS — M25561 Pain in right knee: Secondary | ICD-10-CM | POA: Diagnosis not present

## 2023-10-13 DIAGNOSIS — M25661 Stiffness of right knee, not elsewhere classified: Secondary | ICD-10-CM | POA: Diagnosis not present

## 2023-10-13 DIAGNOSIS — M6281 Muscle weakness (generalized): Secondary | ICD-10-CM | POA: Diagnosis not present

## 2023-10-13 DIAGNOSIS — Z96651 Presence of right artificial knee joint: Secondary | ICD-10-CM | POA: Diagnosis not present

## 2023-10-15 DIAGNOSIS — M25561 Pain in right knee: Secondary | ICD-10-CM | POA: Diagnosis not present

## 2023-10-21 DIAGNOSIS — G4733 Obstructive sleep apnea (adult) (pediatric): Secondary | ICD-10-CM | POA: Diagnosis not present

## 2023-10-22 DIAGNOSIS — M25661 Stiffness of right knee, not elsewhere classified: Secondary | ICD-10-CM | POA: Diagnosis not present

## 2023-10-22 DIAGNOSIS — M19011 Primary osteoarthritis, right shoulder: Secondary | ICD-10-CM | POA: Diagnosis not present

## 2023-10-22 DIAGNOSIS — M25561 Pain in right knee: Secondary | ICD-10-CM | POA: Diagnosis not present

## 2023-10-22 DIAGNOSIS — Z96651 Presence of right artificial knee joint: Secondary | ICD-10-CM | POA: Diagnosis not present

## 2023-10-22 DIAGNOSIS — M6281 Muscle weakness (generalized): Secondary | ICD-10-CM | POA: Diagnosis not present

## 2023-10-26 DIAGNOSIS — M25561 Pain in right knee: Secondary | ICD-10-CM | POA: Diagnosis not present

## 2023-10-26 DIAGNOSIS — M25661 Stiffness of right knee, not elsewhere classified: Secondary | ICD-10-CM | POA: Diagnosis not present

## 2023-10-26 DIAGNOSIS — Z96651 Presence of right artificial knee joint: Secondary | ICD-10-CM | POA: Diagnosis not present

## 2023-10-26 DIAGNOSIS — M6281 Muscle weakness (generalized): Secondary | ICD-10-CM | POA: Diagnosis not present

## 2023-10-29 DIAGNOSIS — M6281 Muscle weakness (generalized): Secondary | ICD-10-CM | POA: Diagnosis not present

## 2023-10-29 DIAGNOSIS — M25661 Stiffness of right knee, not elsewhere classified: Secondary | ICD-10-CM | POA: Diagnosis not present

## 2023-10-29 DIAGNOSIS — M25561 Pain in right knee: Secondary | ICD-10-CM | POA: Diagnosis not present

## 2023-10-29 DIAGNOSIS — Z96651 Presence of right artificial knee joint: Secondary | ICD-10-CM | POA: Diagnosis not present

## 2023-11-03 DIAGNOSIS — M25661 Stiffness of right knee, not elsewhere classified: Secondary | ICD-10-CM | POA: Diagnosis not present

## 2023-11-03 DIAGNOSIS — Z96651 Presence of right artificial knee joint: Secondary | ICD-10-CM | POA: Diagnosis not present

## 2023-11-03 DIAGNOSIS — M6281 Muscle weakness (generalized): Secondary | ICD-10-CM | POA: Diagnosis not present

## 2023-11-03 DIAGNOSIS — M25561 Pain in right knee: Secondary | ICD-10-CM | POA: Diagnosis not present

## 2023-11-05 DIAGNOSIS — Z96651 Presence of right artificial knee joint: Secondary | ICD-10-CM | POA: Diagnosis not present

## 2023-11-06 DIAGNOSIS — M25561 Pain in right knee: Secondary | ICD-10-CM | POA: Diagnosis not present

## 2023-11-06 DIAGNOSIS — M25661 Stiffness of right knee, not elsewhere classified: Secondary | ICD-10-CM | POA: Diagnosis not present

## 2023-11-06 DIAGNOSIS — Z96651 Presence of right artificial knee joint: Secondary | ICD-10-CM | POA: Diagnosis not present

## 2023-11-06 DIAGNOSIS — M6281 Muscle weakness (generalized): Secondary | ICD-10-CM | POA: Diagnosis not present

## 2023-11-09 DIAGNOSIS — R0609 Other forms of dyspnea: Secondary | ICD-10-CM | POA: Diagnosis not present

## 2023-11-09 DIAGNOSIS — I519 Heart disease, unspecified: Secondary | ICD-10-CM | POA: Diagnosis not present

## 2023-11-09 DIAGNOSIS — G4733 Obstructive sleep apnea (adult) (pediatric): Secondary | ICD-10-CM | POA: Diagnosis not present

## 2023-11-10 DIAGNOSIS — M25661 Stiffness of right knee, not elsewhere classified: Secondary | ICD-10-CM | POA: Diagnosis not present

## 2023-11-10 DIAGNOSIS — M25561 Pain in right knee: Secondary | ICD-10-CM | POA: Diagnosis not present

## 2023-11-10 DIAGNOSIS — Z96651 Presence of right artificial knee joint: Secondary | ICD-10-CM | POA: Diagnosis not present

## 2023-11-10 DIAGNOSIS — M6281 Muscle weakness (generalized): Secondary | ICD-10-CM | POA: Diagnosis not present

## 2023-11-12 DIAGNOSIS — M25512 Pain in left shoulder: Secondary | ICD-10-CM | POA: Diagnosis not present

## 2023-11-12 DIAGNOSIS — Z96651 Presence of right artificial knee joint: Secondary | ICD-10-CM | POA: Diagnosis not present

## 2023-11-12 DIAGNOSIS — G8929 Other chronic pain: Secondary | ICD-10-CM | POA: Diagnosis not present

## 2023-11-12 DIAGNOSIS — M6281 Muscle weakness (generalized): Secondary | ICD-10-CM | POA: Diagnosis not present

## 2023-11-12 DIAGNOSIS — M25661 Stiffness of right knee, not elsewhere classified: Secondary | ICD-10-CM | POA: Diagnosis not present

## 2023-11-12 DIAGNOSIS — M25511 Pain in right shoulder: Secondary | ICD-10-CM | POA: Diagnosis not present

## 2023-11-12 DIAGNOSIS — M25561 Pain in right knee: Secondary | ICD-10-CM | POA: Diagnosis not present

## 2023-11-24 DIAGNOSIS — Z96651 Presence of right artificial knee joint: Secondary | ICD-10-CM | POA: Diagnosis not present

## 2023-11-24 DIAGNOSIS — M25661 Stiffness of right knee, not elsewhere classified: Secondary | ICD-10-CM | POA: Diagnosis not present

## 2023-11-24 DIAGNOSIS — M6281 Muscle weakness (generalized): Secondary | ICD-10-CM | POA: Diagnosis not present

## 2023-11-24 DIAGNOSIS — M25561 Pain in right knee: Secondary | ICD-10-CM | POA: Diagnosis not present

## 2023-11-26 DIAGNOSIS — Z96651 Presence of right artificial knee joint: Secondary | ICD-10-CM | POA: Diagnosis not present

## 2023-11-26 DIAGNOSIS — M25561 Pain in right knee: Secondary | ICD-10-CM | POA: Diagnosis not present

## 2023-11-26 DIAGNOSIS — M6281 Muscle weakness (generalized): Secondary | ICD-10-CM | POA: Diagnosis not present

## 2023-11-26 DIAGNOSIS — M25661 Stiffness of right knee, not elsewhere classified: Secondary | ICD-10-CM | POA: Diagnosis not present

## 2023-12-08 DIAGNOSIS — Z96651 Presence of right artificial knee joint: Secondary | ICD-10-CM | POA: Diagnosis not present

## 2023-12-08 DIAGNOSIS — M25561 Pain in right knee: Secondary | ICD-10-CM | POA: Diagnosis not present

## 2023-12-08 DIAGNOSIS — M6281 Muscle weakness (generalized): Secondary | ICD-10-CM | POA: Diagnosis not present

## 2023-12-08 DIAGNOSIS — M25661 Stiffness of right knee, not elsewhere classified: Secondary | ICD-10-CM | POA: Diagnosis not present

## 2023-12-10 DIAGNOSIS — M6281 Muscle weakness (generalized): Secondary | ICD-10-CM | POA: Diagnosis not present

## 2023-12-10 DIAGNOSIS — M25561 Pain in right knee: Secondary | ICD-10-CM | POA: Diagnosis not present

## 2023-12-10 DIAGNOSIS — M25661 Stiffness of right knee, not elsewhere classified: Secondary | ICD-10-CM | POA: Diagnosis not present

## 2023-12-10 DIAGNOSIS — Z96651 Presence of right artificial knee joint: Secondary | ICD-10-CM | POA: Diagnosis not present

## 2023-12-15 DIAGNOSIS — M25561 Pain in right knee: Secondary | ICD-10-CM | POA: Diagnosis not present

## 2023-12-15 DIAGNOSIS — M6281 Muscle weakness (generalized): Secondary | ICD-10-CM | POA: Diagnosis not present

## 2023-12-15 DIAGNOSIS — M25661 Stiffness of right knee, not elsewhere classified: Secondary | ICD-10-CM | POA: Diagnosis not present

## 2023-12-15 DIAGNOSIS — Z96651 Presence of right artificial knee joint: Secondary | ICD-10-CM | POA: Diagnosis not present

## 2023-12-17 DIAGNOSIS — M6281 Muscle weakness (generalized): Secondary | ICD-10-CM | POA: Diagnosis not present

## 2023-12-17 DIAGNOSIS — Z96651 Presence of right artificial knee joint: Secondary | ICD-10-CM | POA: Diagnosis not present

## 2023-12-17 DIAGNOSIS — M25661 Stiffness of right knee, not elsewhere classified: Secondary | ICD-10-CM | POA: Diagnosis not present

## 2023-12-17 DIAGNOSIS — M25561 Pain in right knee: Secondary | ICD-10-CM | POA: Diagnosis not present

## 2023-12-29 DIAGNOSIS — Z96651 Presence of right artificial knee joint: Secondary | ICD-10-CM | POA: Diagnosis not present

## 2023-12-29 DIAGNOSIS — M25661 Stiffness of right knee, not elsewhere classified: Secondary | ICD-10-CM | POA: Diagnosis not present

## 2023-12-29 DIAGNOSIS — M6281 Muscle weakness (generalized): Secondary | ICD-10-CM | POA: Diagnosis not present

## 2023-12-29 DIAGNOSIS — M25561 Pain in right knee: Secondary | ICD-10-CM | POA: Diagnosis not present

## 2023-12-31 DIAGNOSIS — M6281 Muscle weakness (generalized): Secondary | ICD-10-CM | POA: Diagnosis not present

## 2023-12-31 DIAGNOSIS — Z96651 Presence of right artificial knee joint: Secondary | ICD-10-CM | POA: Diagnosis not present

## 2023-12-31 DIAGNOSIS — M25661 Stiffness of right knee, not elsewhere classified: Secondary | ICD-10-CM | POA: Diagnosis not present

## 2023-12-31 DIAGNOSIS — M25561 Pain in right knee: Secondary | ICD-10-CM | POA: Diagnosis not present

## 2024-01-07 DIAGNOSIS — M25661 Stiffness of right knee, not elsewhere classified: Secondary | ICD-10-CM | POA: Diagnosis not present

## 2024-01-07 DIAGNOSIS — M25561 Pain in right knee: Secondary | ICD-10-CM | POA: Diagnosis not present

## 2024-01-07 DIAGNOSIS — Z96651 Presence of right artificial knee joint: Secondary | ICD-10-CM | POA: Diagnosis not present

## 2024-01-07 DIAGNOSIS — M6281 Muscle weakness (generalized): Secondary | ICD-10-CM | POA: Diagnosis not present

## 2024-01-14 DIAGNOSIS — I5022 Chronic systolic (congestive) heart failure: Secondary | ICD-10-CM | POA: Diagnosis not present

## 2024-01-14 DIAGNOSIS — I10 Essential (primary) hypertension: Secondary | ICD-10-CM | POA: Diagnosis not present

## 2024-01-14 DIAGNOSIS — E7800 Pure hypercholesterolemia, unspecified: Secondary | ICD-10-CM | POA: Diagnosis not present

## 2024-01-14 DIAGNOSIS — I2510 Atherosclerotic heart disease of native coronary artery without angina pectoris: Secondary | ICD-10-CM | POA: Diagnosis not present

## 2024-01-14 DIAGNOSIS — G4733 Obstructive sleep apnea (adult) (pediatric): Secondary | ICD-10-CM | POA: Diagnosis not present

## 2024-01-19 DIAGNOSIS — Z96651 Presence of right artificial knee joint: Secondary | ICD-10-CM | POA: Diagnosis not present

## 2024-01-19 DIAGNOSIS — M25561 Pain in right knee: Secondary | ICD-10-CM | POA: Diagnosis not present

## 2024-01-19 DIAGNOSIS — M6281 Muscle weakness (generalized): Secondary | ICD-10-CM | POA: Diagnosis not present

## 2024-01-19 DIAGNOSIS — M25661 Stiffness of right knee, not elsewhere classified: Secondary | ICD-10-CM | POA: Diagnosis not present

## 2024-01-20 DIAGNOSIS — G4733 Obstructive sleep apnea (adult) (pediatric): Secondary | ICD-10-CM | POA: Diagnosis not present

## 2024-01-21 DIAGNOSIS — M6281 Muscle weakness (generalized): Secondary | ICD-10-CM | POA: Diagnosis not present

## 2024-01-21 DIAGNOSIS — M25661 Stiffness of right knee, not elsewhere classified: Secondary | ICD-10-CM | POA: Diagnosis not present

## 2024-01-21 DIAGNOSIS — Z96651 Presence of right artificial knee joint: Secondary | ICD-10-CM | POA: Diagnosis not present

## 2024-01-21 DIAGNOSIS — M25561 Pain in right knee: Secondary | ICD-10-CM | POA: Diagnosis not present

## 2024-01-26 DIAGNOSIS — M25561 Pain in right knee: Secondary | ICD-10-CM | POA: Diagnosis not present

## 2024-01-26 DIAGNOSIS — M25661 Stiffness of right knee, not elsewhere classified: Secondary | ICD-10-CM | POA: Diagnosis not present

## 2024-01-26 DIAGNOSIS — M6281 Muscle weakness (generalized): Secondary | ICD-10-CM | POA: Diagnosis not present

## 2024-01-26 DIAGNOSIS — Z96651 Presence of right artificial knee joint: Secondary | ICD-10-CM | POA: Diagnosis not present

## 2024-01-28 DIAGNOSIS — Z96651 Presence of right artificial knee joint: Secondary | ICD-10-CM | POA: Diagnosis not present

## 2024-01-28 DIAGNOSIS — M25561 Pain in right knee: Secondary | ICD-10-CM | POA: Diagnosis not present

## 2024-01-28 DIAGNOSIS — M6281 Muscle weakness (generalized): Secondary | ICD-10-CM | POA: Diagnosis not present

## 2024-01-28 DIAGNOSIS — M25661 Stiffness of right knee, not elsewhere classified: Secondary | ICD-10-CM | POA: Diagnosis not present

## 2024-02-03 DIAGNOSIS — M25661 Stiffness of right knee, not elsewhere classified: Secondary | ICD-10-CM | POA: Diagnosis not present

## 2024-02-03 DIAGNOSIS — M25561 Pain in right knee: Secondary | ICD-10-CM | POA: Diagnosis not present

## 2024-02-03 DIAGNOSIS — Z96651 Presence of right artificial knee joint: Secondary | ICD-10-CM | POA: Diagnosis not present

## 2024-02-03 DIAGNOSIS — M6281 Muscle weakness (generalized): Secondary | ICD-10-CM | POA: Diagnosis not present

## 2024-02-04 DIAGNOSIS — Z96651 Presence of right artificial knee joint: Secondary | ICD-10-CM | POA: Diagnosis not present

## 2024-02-04 DIAGNOSIS — M25661 Stiffness of right knee, not elsewhere classified: Secondary | ICD-10-CM | POA: Diagnosis not present

## 2024-02-04 DIAGNOSIS — M25561 Pain in right knee: Secondary | ICD-10-CM | POA: Diagnosis not present

## 2024-02-04 DIAGNOSIS — M6281 Muscle weakness (generalized): Secondary | ICD-10-CM | POA: Diagnosis not present

## 2024-02-09 DIAGNOSIS — M25661 Stiffness of right knee, not elsewhere classified: Secondary | ICD-10-CM | POA: Diagnosis not present

## 2024-02-09 DIAGNOSIS — M6281 Muscle weakness (generalized): Secondary | ICD-10-CM | POA: Diagnosis not present

## 2024-02-09 DIAGNOSIS — Z96651 Presence of right artificial knee joint: Secondary | ICD-10-CM | POA: Diagnosis not present

## 2024-02-09 DIAGNOSIS — M25561 Pain in right knee: Secondary | ICD-10-CM | POA: Diagnosis not present

## 2024-02-11 DIAGNOSIS — M25561 Pain in right knee: Secondary | ICD-10-CM | POA: Diagnosis not present

## 2024-02-11 DIAGNOSIS — M25661 Stiffness of right knee, not elsewhere classified: Secondary | ICD-10-CM | POA: Diagnosis not present

## 2024-02-11 DIAGNOSIS — M6281 Muscle weakness (generalized): Secondary | ICD-10-CM | POA: Diagnosis not present

## 2024-02-11 DIAGNOSIS — Z96651 Presence of right artificial knee joint: Secondary | ICD-10-CM | POA: Diagnosis not present

## 2024-02-16 DIAGNOSIS — M25661 Stiffness of right knee, not elsewhere classified: Secondary | ICD-10-CM | POA: Diagnosis not present

## 2024-02-16 DIAGNOSIS — Z96651 Presence of right artificial knee joint: Secondary | ICD-10-CM | POA: Diagnosis not present

## 2024-02-16 DIAGNOSIS — M6281 Muscle weakness (generalized): Secondary | ICD-10-CM | POA: Diagnosis not present

## 2024-02-16 DIAGNOSIS — M25561 Pain in right knee: Secondary | ICD-10-CM | POA: Diagnosis not present

## 2024-02-17 DIAGNOSIS — I10 Essential (primary) hypertension: Secondary | ICD-10-CM | POA: Diagnosis not present

## 2024-02-17 DIAGNOSIS — G629 Polyneuropathy, unspecified: Secondary | ICD-10-CM | POA: Diagnosis not present

## 2024-02-18 DIAGNOSIS — M6281 Muscle weakness (generalized): Secondary | ICD-10-CM | POA: Diagnosis not present

## 2024-02-18 DIAGNOSIS — M25661 Stiffness of right knee, not elsewhere classified: Secondary | ICD-10-CM | POA: Diagnosis not present

## 2024-02-18 DIAGNOSIS — M25561 Pain in right knee: Secondary | ICD-10-CM | POA: Diagnosis not present

## 2024-02-18 DIAGNOSIS — Z96651 Presence of right artificial knee joint: Secondary | ICD-10-CM | POA: Diagnosis not present

## 2024-02-22 ENCOUNTER — Other Ambulatory Visit: Payer: Self-pay | Admitting: Internal Medicine

## 2024-02-22 DIAGNOSIS — Z1231 Encounter for screening mammogram for malignant neoplasm of breast: Secondary | ICD-10-CM

## 2024-03-03 DIAGNOSIS — M25561 Pain in right knee: Secondary | ICD-10-CM | POA: Diagnosis not present

## 2024-03-04 ENCOUNTER — Encounter

## 2024-03-09 ENCOUNTER — Encounter (HOSPITAL_COMMUNITY): Payer: Self-pay

## 2024-03-16 ENCOUNTER — Ambulatory Visit
Admission: RE | Admit: 2024-03-16 | Discharge: 2024-03-16 | Disposition: A | Discharge status: Home / Self Care | Source: Ambulatory Visit | Attending: Internal Medicine | Admitting: Internal Medicine

## 2024-03-16 DIAGNOSIS — Z1231 Encounter for screening mammogram for malignant neoplasm of breast: Secondary | ICD-10-CM | POA: Diagnosis not present

## 2024-03-24 DIAGNOSIS — Z96651 Presence of right artificial knee joint: Secondary | ICD-10-CM | POA: Diagnosis not present

## 2024-04-21 DIAGNOSIS — G4733 Obstructive sleep apnea (adult) (pediatric): Secondary | ICD-10-CM | POA: Diagnosis not present

## 2024-05-09 DIAGNOSIS — G4733 Obstructive sleep apnea (adult) (pediatric): Secondary | ICD-10-CM | POA: Diagnosis not present

## 2024-05-09 DIAGNOSIS — R0609 Other forms of dyspnea: Secondary | ICD-10-CM | POA: Diagnosis not present

## 2024-07-14 DIAGNOSIS — I5022 Chronic systolic (congestive) heart failure: Secondary | ICD-10-CM | POA: Diagnosis not present

## 2024-07-14 DIAGNOSIS — E7800 Pure hypercholesterolemia, unspecified: Secondary | ICD-10-CM | POA: Diagnosis not present

## 2024-07-14 DIAGNOSIS — I2510 Atherosclerotic heart disease of native coronary artery without angina pectoris: Secondary | ICD-10-CM | POA: Diagnosis not present

## 2024-07-14 DIAGNOSIS — I10 Essential (primary) hypertension: Secondary | ICD-10-CM | POA: Diagnosis not present

## 2024-07-22 DIAGNOSIS — G4733 Obstructive sleep apnea (adult) (pediatric): Secondary | ICD-10-CM | POA: Diagnosis not present
